# Patient Record
Sex: Male | Born: 1945 | Race: White | Hispanic: No | Marital: Married | Smoking: Never smoker
Health system: Southern US, Community
[De-identification: ages and names within clinical notes are randomized; demographics above are authoritative.]

## PROBLEM LIST (undated history)

## (undated) DIAGNOSIS — C801 Malignant (primary) neoplasm, unspecified: Secondary | ICD-10-CM

## (undated) DIAGNOSIS — N400 Enlarged prostate without lower urinary tract symptoms: Secondary | ICD-10-CM

## (undated) DIAGNOSIS — E7800 Pure hypercholesterolemia, unspecified: Secondary | ICD-10-CM

## (undated) DIAGNOSIS — Z87442 Personal history of urinary calculi: Secondary | ICD-10-CM

## (undated) DIAGNOSIS — H9319 Tinnitus, unspecified ear: Secondary | ICD-10-CM

## (undated) DIAGNOSIS — R5383 Other fatigue: Secondary | ICD-10-CM

## (undated) HISTORY — DX: Benign prostatic hyperplasia without lower urinary tract symptoms: N40.0

## (undated) HISTORY — DX: Other fatigue: R53.83

## (undated) HISTORY — DX: Personal history of urinary calculi: Z87.442

## (undated) HISTORY — DX: Tinnitus, unspecified ear: H93.19

## (undated) HISTORY — PX: CHOLECYSTECTOMY: SHX55

## (undated) HISTORY — DX: Pure hypercholesterolemia, unspecified: E78.00

---

## 2007-09-12 ENCOUNTER — Ambulatory Visit: Payer: Self-pay | Admitting: Urology

## 2007-09-12 HISTORY — PX: LITHOTRIPSY: SUR834

## 2007-11-07 ENCOUNTER — Ambulatory Visit: Payer: Self-pay | Admitting: Urology

## 2007-11-07 HISTORY — PX: LITHOTRIPSY: SUR834

## 2011-09-11 DIAGNOSIS — Z0000 Encounter for general adult medical examination without abnormal findings: Secondary | ICD-10-CM | POA: Diagnosis not present

## 2011-09-12 DIAGNOSIS — Z125 Encounter for screening for malignant neoplasm of prostate: Secondary | ICD-10-CM | POA: Diagnosis not present

## 2011-09-12 DIAGNOSIS — Z0000 Encounter for general adult medical examination without abnormal findings: Secondary | ICD-10-CM | POA: Diagnosis not present

## 2011-09-12 DIAGNOSIS — Z23 Encounter for immunization: Secondary | ICD-10-CM | POA: Diagnosis not present

## 2011-11-16 DIAGNOSIS — L821 Other seborrheic keratosis: Secondary | ICD-10-CM | POA: Diagnosis not present

## 2011-11-16 DIAGNOSIS — D239 Other benign neoplasm of skin, unspecified: Secondary | ICD-10-CM | POA: Diagnosis not present

## 2011-11-16 DIAGNOSIS — Z85828 Personal history of other malignant neoplasm of skin: Secondary | ICD-10-CM | POA: Diagnosis not present

## 2011-11-16 DIAGNOSIS — L570 Actinic keratosis: Secondary | ICD-10-CM | POA: Diagnosis not present

## 2011-11-16 DIAGNOSIS — L820 Inflamed seborrheic keratosis: Secondary | ICD-10-CM | POA: Diagnosis not present

## 2012-01-16 DIAGNOSIS — L500 Allergic urticaria: Secondary | ICD-10-CM | POA: Diagnosis not present

## 2012-03-20 DIAGNOSIS — H538 Other visual disturbances: Secondary | ICD-10-CM | POA: Diagnosis not present

## 2012-03-25 DIAGNOSIS — H538 Other visual disturbances: Secondary | ICD-10-CM | POA: Diagnosis not present

## 2012-03-25 DIAGNOSIS — M259 Joint disorder, unspecified: Secondary | ICD-10-CM | POA: Diagnosis not present

## 2012-04-02 DIAGNOSIS — S8000XA Contusion of unspecified knee, initial encounter: Secondary | ICD-10-CM | POA: Diagnosis not present

## 2012-04-02 DIAGNOSIS — S93499A Sprain of other ligament of unspecified ankle, initial encounter: Secondary | ICD-10-CM | POA: Diagnosis not present

## 2012-04-02 DIAGNOSIS — S96819A Strain of other specified muscles and tendons at ankle and foot level, unspecified foot, initial encounter: Secondary | ICD-10-CM | POA: Diagnosis not present

## 2012-11-18 DIAGNOSIS — L578 Other skin changes due to chronic exposure to nonionizing radiation: Secondary | ICD-10-CM | POA: Diagnosis not present

## 2012-11-18 DIAGNOSIS — Z85828 Personal history of other malignant neoplasm of skin: Secondary | ICD-10-CM | POA: Diagnosis not present

## 2012-11-18 DIAGNOSIS — L821 Other seborrheic keratosis: Secondary | ICD-10-CM | POA: Diagnosis not present

## 2012-11-18 DIAGNOSIS — L570 Actinic keratosis: Secondary | ICD-10-CM | POA: Diagnosis not present

## 2012-11-18 DIAGNOSIS — L820 Inflamed seborrheic keratosis: Secondary | ICD-10-CM | POA: Diagnosis not present

## 2013-05-01 DIAGNOSIS — L578 Other skin changes due to chronic exposure to nonionizing radiation: Secondary | ICD-10-CM | POA: Diagnosis not present

## 2013-05-01 DIAGNOSIS — L570 Actinic keratosis: Secondary | ICD-10-CM | POA: Diagnosis not present

## 2013-05-01 DIAGNOSIS — R209 Unspecified disturbances of skin sensation: Secondary | ICD-10-CM | POA: Diagnosis not present

## 2013-05-01 DIAGNOSIS — D485 Neoplasm of uncertain behavior of skin: Secondary | ICD-10-CM | POA: Diagnosis not present

## 2013-05-01 HISTORY — DX: Actinic keratosis: L57.0

## 2013-05-20 DIAGNOSIS — Z85828 Personal history of other malignant neoplasm of skin: Secondary | ICD-10-CM | POA: Diagnosis not present

## 2013-05-20 DIAGNOSIS — D239 Other benign neoplasm of skin, unspecified: Secondary | ICD-10-CM | POA: Diagnosis not present

## 2013-05-20 DIAGNOSIS — L578 Other skin changes due to chronic exposure to nonionizing radiation: Secondary | ICD-10-CM | POA: Diagnosis not present

## 2013-05-20 DIAGNOSIS — L820 Inflamed seborrheic keratosis: Secondary | ICD-10-CM | POA: Diagnosis not present

## 2013-05-20 DIAGNOSIS — L821 Other seborrheic keratosis: Secondary | ICD-10-CM | POA: Diagnosis not present

## 2013-05-20 DIAGNOSIS — L570 Actinic keratosis: Secondary | ICD-10-CM | POA: Diagnosis not present

## 2014-01-15 DIAGNOSIS — Z23 Encounter for immunization: Secondary | ICD-10-CM | POA: Diagnosis not present

## 2014-01-15 DIAGNOSIS — R0600 Dyspnea, unspecified: Secondary | ICD-10-CM | POA: Diagnosis not present

## 2014-01-15 DIAGNOSIS — R079 Chest pain, unspecified: Secondary | ICD-10-CM | POA: Diagnosis not present

## 2014-01-15 DIAGNOSIS — R031 Nonspecific low blood-pressure reading: Secondary | ICD-10-CM | POA: Diagnosis not present

## 2014-01-15 DIAGNOSIS — Z125 Encounter for screening for malignant neoplasm of prostate: Secondary | ICD-10-CM | POA: Diagnosis not present

## 2014-01-15 DIAGNOSIS — Z0000 Encounter for general adult medical examination without abnormal findings: Secondary | ICD-10-CM | POA: Diagnosis not present

## 2014-01-22 DIAGNOSIS — R0602 Shortness of breath: Secondary | ICD-10-CM | POA: Diagnosis not present

## 2014-01-22 DIAGNOSIS — R079 Chest pain, unspecified: Secondary | ICD-10-CM | POA: Diagnosis not present

## 2014-01-22 DIAGNOSIS — R011 Cardiac murmur, unspecified: Secondary | ICD-10-CM | POA: Diagnosis not present

## 2014-01-22 DIAGNOSIS — R05 Cough: Secondary | ICD-10-CM | POA: Diagnosis not present

## 2014-02-03 DIAGNOSIS — R0602 Shortness of breath: Secondary | ICD-10-CM | POA: Diagnosis not present

## 2014-02-03 DIAGNOSIS — R011 Cardiac murmur, unspecified: Secondary | ICD-10-CM | POA: Diagnosis not present

## 2014-02-03 DIAGNOSIS — R079 Chest pain, unspecified: Secondary | ICD-10-CM | POA: Diagnosis not present

## 2014-02-10 DIAGNOSIS — R011 Cardiac murmur, unspecified: Secondary | ICD-10-CM | POA: Diagnosis not present

## 2014-02-10 DIAGNOSIS — R05 Cough: Secondary | ICD-10-CM | POA: Diagnosis not present

## 2014-02-10 DIAGNOSIS — R072 Precordial pain: Secondary | ICD-10-CM | POA: Diagnosis not present

## 2014-02-10 DIAGNOSIS — R0602 Shortness of breath: Secondary | ICD-10-CM | POA: Diagnosis not present

## 2014-05-21 DIAGNOSIS — L570 Actinic keratosis: Secondary | ICD-10-CM | POA: Diagnosis not present

## 2014-05-21 DIAGNOSIS — Z1283 Encounter for screening for malignant neoplasm of skin: Secondary | ICD-10-CM | POA: Diagnosis not present

## 2014-05-21 DIAGNOSIS — Z85828 Personal history of other malignant neoplasm of skin: Secondary | ICD-10-CM | POA: Diagnosis not present

## 2014-05-21 DIAGNOSIS — L820 Inflamed seborrheic keratosis: Secondary | ICD-10-CM | POA: Diagnosis not present

## 2014-05-21 DIAGNOSIS — L821 Other seborrheic keratosis: Secondary | ICD-10-CM | POA: Diagnosis not present

## 2014-05-21 DIAGNOSIS — L578 Other skin changes due to chronic exposure to nonionizing radiation: Secondary | ICD-10-CM | POA: Diagnosis not present

## 2014-05-21 DIAGNOSIS — D1800 Hemangioma unspecified site: Secondary | ICD-10-CM | POA: Diagnosis not present

## 2014-07-15 DIAGNOSIS — L578 Other skin changes due to chronic exposure to nonionizing radiation: Secondary | ICD-10-CM | POA: Diagnosis not present

## 2014-07-15 DIAGNOSIS — L821 Other seborrheic keratosis: Secondary | ICD-10-CM | POA: Diagnosis not present

## 2014-07-15 DIAGNOSIS — L820 Inflamed seborrheic keratosis: Secondary | ICD-10-CM | POA: Diagnosis not present

## 2014-07-15 DIAGNOSIS — Z87442 Personal history of urinary calculi: Secondary | ICD-10-CM | POA: Insufficient documentation

## 2014-07-15 HISTORY — DX: Personal history of urinary calculi: Z87.442

## 2014-07-16 ENCOUNTER — Encounter: Payer: Self-pay | Admitting: Family Medicine

## 2014-07-16 ENCOUNTER — Ambulatory Visit (INDEPENDENT_AMBULATORY_CARE_PROVIDER_SITE_OTHER): Payer: Medicare Other | Admitting: Family Medicine

## 2014-07-16 VITALS — BP 117/71 | HR 69 | Temp 97.4°F | Ht 70.9 in | Wt 220.6 lb

## 2014-07-16 DIAGNOSIS — R5383 Other fatigue: Secondary | ICD-10-CM | POA: Diagnosis not present

## 2014-07-16 DIAGNOSIS — F5221 Male erectile disorder: Secondary | ICD-10-CM

## 2014-07-16 DIAGNOSIS — F528 Other sexual dysfunction not due to a substance or known physiological condition: Secondary | ICD-10-CM

## 2014-07-16 DIAGNOSIS — H6123 Impacted cerumen, bilateral: Secondary | ICD-10-CM

## 2014-07-16 MED ORDER — SILDENAFIL CITRATE 20 MG PO TABS: 20.0000 mg | ORAL_TABLET | Freq: Three times a day (TID) | ORAL | Status: DC

## 2014-07-16 MED ORDER — NEOMYCIN-POLYMYXIN-HC 3.5-10000-1 OT SOLN: 4.0000 [drp] | Freq: Four times a day (QID) | OTIC | Status: DC

## 2014-07-16 MED ORDER — SILDENAFIL CITRATE 100 MG PO TABS: 100.0000 mg | ORAL_TABLET | Freq: Every day | ORAL | Status: DC | PRN

## 2014-07-16 NOTE — Progress Notes (Addendum)
BP 117/71 mmHg  Pulse 69  Temp(Src) 97.4 F (36.3 C)  Ht 5' 10.9" (1.801 m)  Wt 220 lb 9.6 oz (100.064 kg)  BMI 30.85 kg/m2  SpO2 94%   Subjective:    Patient ID: Darius Williams, male    DOB: 08/22/1945, 69 y.o.   MRN: [id number]  HPI: Darius Williams is a 69 y.o. male  Chief Complaint  Patient presents with  . Follow-up    pt thinks this is a physical follow-up   Doing well but having fatague 7-8 hrs sleep at night Good nutrition Also ED concerns And ears stopped up wants wax removed  Relevant past medical, surgical, family and social history reviewed and updated as indicated. Interim medical history since our last visit reviewed. Allergies and medications reviewed and updated.  Review of Systems  Constitutional: Negative.   Respiratory: Negative.   Cardiovascular: Negative.     Per HPI unless specifically indicated above     Objective:    BP 117/71 mmHg  Pulse 69  Temp(Src) 97.4 F (36.3 C)  Ht 5' 10.9" (1.801 m)  Wt 220 lb 9.6 oz (100.064 kg)  BMI 30.85 kg/m2  SpO2 94%  Wt Readings from Last 3 Encounters:  07/16/14 220 lb 9.6 oz (100.064 kg)  01/15/14 197 lb (89.359 kg)    Physical Exam  Constitutional: He is oriented to person, place, and time. He appears well-developed and well-nourished. No distress.  HENT:  Head: Normocephalic and atraumatic.  Right Ear: Hearing normal.  Left Ear: Hearing normal.  Nose: Nose normal.  cerumen removed with difficulty in lt ear with eiather trauma or uncovered inflation. Painful Given cortisporin  After wax removed no other than inflamed  Eyes: Conjunctivae and lids are normal. Right eye exhibits no discharge. Left eye exhibits no discharge. No scleral icterus.  Cardiovascular: Normal rate, regular rhythm and normal heart sounds.   Pulmonary/Chest: Effort normal and breath sounds normal. No respiratory distress.  Musculoskeletal: Normal range of motion.  Neurological: He is alert and oriented to person, place,  and time.  Skin: Skin is intact. No rash noted.  Psychiatric: He has a normal mood and affect. His speech is normal and behavior is normal. Judgment and thought content normal. Cognition and memory are normal.    Results for orders placed or performed in visit on 10/29/23  Basic metabolic panel  Result Value Ref Range   Glucose 117 (H) 65 - 99 mg/dL   BUN 10 8 - 27 mg/dL   Creatinine, Ser 0.89 0.76 - 1.27 mg/dL   GFR calc non Af Amer 88 >59 mL/min/1.73   GFR calc Af Amer 102 >59 mL/min/1.73   BUN/Creatinine Ratio 11 10 - 22   Sodium 140 134 - 144 mmol/L   Potassium 5.0 3.5 - 5.2 mmol/L   Chloride 101 97 - 108 mmol/L   CO2 25 18 - 29 mmol/L   Calcium 9.0 8.6 - 10.2 mg/dL      Assessment & Plan:   Problem List Items Addressed This Visit    None    Visit Diagnoses    Other fatigue    -  Primary    stable    Relevant Orders    Basic metabolic panel (Completed)    ED (erectile dysfunction) of non-organic origin        discussed viagra    Cerumen debris on tympanic membrane of both ears            Follow up  Phone call Discussed with patient elevated glucose on BMP nonfasting but discussed better diet and exercise nutrition issues

## 2014-07-17 LAB — BASIC METABOLIC PANEL
BUN/Creatinine Ratio: 11 (ref 10–22)
BUN: 10 mg/dL (ref 8–27)
CO2: 25 mmol/L (ref 18–29)
Calcium: 9 mg/dL (ref 8.6–10.2)
Chloride: 101 mmol/L (ref 97–108)
Creatinine, Ser: 0.89 mg/dL (ref 0.76–1.27)
GFR calc Af Amer: 102 mL/min/{1.73_m2} (ref >59)
GFR calc non Af Amer: 88 mL/min/{1.73_m2} (ref >59)
Glucose: 117 mg/dL — ABNORMAL HIGH (ref 65–99)
Potassium: 5 mmol/L (ref 3.5–5.2)
Sodium: 140 mmol/L (ref 134–144)

## 2014-07-22 ENCOUNTER — Telehealth: Payer: Self-pay | Admitting: Family Medicine

## 2014-07-22 NOTE — Telephone Encounter (Signed)
pts wife would like to go over lab results and would like a call back at the number provided

## 2014-11-20 ENCOUNTER — Encounter: Payer: Self-pay | Admitting: Family Medicine

## 2015-01-19 ENCOUNTER — Encounter: Payer: Medicare Other | Admitting: Family Medicine

## 2015-02-04 ENCOUNTER — Encounter: Payer: Medicare Other | Admitting: Family Medicine

## 2015-02-25 DIAGNOSIS — R5383 Other fatigue: Secondary | ICD-10-CM | POA: Diagnosis not present

## 2015-02-25 DIAGNOSIS — R0789 Other chest pain: Secondary | ICD-10-CM | POA: Diagnosis not present

## 2015-02-25 DIAGNOSIS — M159 Polyosteoarthritis, unspecified: Secondary | ICD-10-CM | POA: Diagnosis not present

## 2015-02-25 DIAGNOSIS — R0602 Shortness of breath: Secondary | ICD-10-CM | POA: Diagnosis not present

## 2015-02-25 DIAGNOSIS — R05 Cough: Secondary | ICD-10-CM | POA: Diagnosis not present

## 2015-03-18 ENCOUNTER — Ambulatory Visit (INDEPENDENT_AMBULATORY_CARE_PROVIDER_SITE_OTHER): Payer: Medicare Other | Admitting: Family Medicine

## 2015-03-18 ENCOUNTER — Encounter: Payer: Self-pay | Admitting: Family Medicine

## 2015-03-18 VITALS — BP 103/63 | HR 71 | Temp 97.8°F | Ht 71.1 in | Wt 202.0 lb

## 2015-03-18 DIAGNOSIS — Z0000 Encounter for general adult medical examination without abnormal findings: Secondary | ICD-10-CM

## 2015-03-18 DIAGNOSIS — R5383 Other fatigue: Secondary | ICD-10-CM | POA: Insufficient documentation

## 2015-03-18 DIAGNOSIS — J069 Acute upper respiratory infection, unspecified: Secondary | ICD-10-CM

## 2015-03-18 DIAGNOSIS — Z113 Encounter for screening for infections with a predominantly sexual mode of transmission: Secondary | ICD-10-CM

## 2015-03-18 DIAGNOSIS — N400 Enlarged prostate without lower urinary tract symptoms: Secondary | ICD-10-CM

## 2015-03-18 DIAGNOSIS — E7800 Pure hypercholesterolemia, unspecified: Secondary | ICD-10-CM | POA: Diagnosis not present

## 2015-03-18 DIAGNOSIS — R5382 Chronic fatigue, unspecified: Secondary | ICD-10-CM | POA: Diagnosis not present

## 2015-03-18 DIAGNOSIS — Z87442 Personal history of urinary calculi: Secondary | ICD-10-CM | POA: Diagnosis not present

## 2015-03-18 HISTORY — DX: Other fatigue: R53.83

## 2015-03-18 HISTORY — DX: Benign prostatic hyperplasia without lower urinary tract symptoms: N40.0

## 2015-03-18 HISTORY — DX: Pure hypercholesterolemia, unspecified: E78.00

## 2015-03-18 LAB — URINALYSIS, ROUTINE W REFLEX MICROSCOPIC
Bilirubin, UA: NEGATIVE
Glucose, UA: NEGATIVE
Ketones, UA: NEGATIVE
Leukocytes, UA: NEGATIVE
Nitrite, UA: NEGATIVE
Protein, UA: NEGATIVE
RBC, UA: NEGATIVE
Specific Gravity, UA: 1.02 (ref 1.005–1.030)
Urobilinogen, Ur: 2 mg/dL — ABNORMAL HIGH (ref 0.2–1.0)
pH, UA: 7 (ref 5.0–7.5)

## 2015-03-18 NOTE — Progress Notes (Signed)
BP 103/63 mmHg  Pulse 71  Temp(Src) 97.8 F (36.6 C)  Ht 5' 11.1" (1.806 m)  Wt 202 lb (91.627 kg)  BMI 28.09 kg/m2  SpO2 96%   Subjective:    Patient ID: Darius Williams, male    DOB: 1945/10/14, 70 y.o.   MRN: [id number]  HPI: Darius Williams is a 70 y.o. male  Chief Complaint  Patient presents with  . Annual Exam  . URI    [phone]   patient with sinus pressure congestion feeling bad  Relevant past medical, surgical, family and social history reviewed and updated as indicated. Interim medical history since our last visit reviewed. Allergies and medications reviewed and updated.  Other than above Review of Systems  Constitutional: Negative.   HENT: Negative.   Eyes: Negative.   Respiratory: Negative.   Cardiovascular: Negative.   Gastrointestinal: Negative.   Endocrine: Negative.   Genitourinary: Negative.   Musculoskeletal: Negative.   Skin: Negative.   Allergic/Immunologic: Negative.   Neurological: Negative.   Hematological: Negative.   Psychiatric/Behavioral: Negative.     Per HPI unless specifically indicated above     Objective:    BP 103/63 mmHg  Pulse 71  Temp(Src) 97.8 F (36.6 C)  Ht 5' 11.1" (1.806 m)  Wt 202 lb (91.627 kg)  BMI 28.09 kg/m2  SpO2 96%  Wt Readings from Last 3 Encounters:  03/18/15 202 lb (91.627 kg)  07/16/14 220 lb 9.6 oz (100.064 kg)  01/15/14 197 lb (89.359 kg)    Physical Exam  Constitutional: He is oriented to person, place, and time. He appears well-developed and well-nourished.  HENT:  Head: Normocephalic and atraumatic.  Right Ear: External ear normal.  Left Ear: External ear normal.  Eyes: Conjunctivae and EOM are normal. Pupils are equal, round, and reactive to light.  Neck: Normal range of motion. Neck supple.  Cardiovascular: Normal rate, regular rhythm, normal heart sounds and intact distal pulses.   Pulmonary/Chest: Effort normal and breath sounds normal.  Abdominal: Soft. Bowel sounds are normal. There is  no splenomegaly or hepatomegaly.  Genitourinary: Rectum normal and penis normal.  Prostate enlarged  Musculoskeletal: Normal range of motion.  Neurological: He is alert and oriented to person, place, and time. He has normal reflexes.  Skin: No rash noted. No erythema.  Psychiatric: He has a normal mood and affect. His behavior is normal. Judgment and thought content normal.    Results for orders placed or performed in visit on [id number]  Basic metabolic panel  Result Value Ref Range   Glucose 117 (H) 65 - 99 mg/dL   BUN 10 8 - 27 mg/dL   Creatinine, Ser 0.89 0.76 - 1.27 mg/dL   GFR calc non Af Amer 88 >59 mL/min/1.73   GFR calc Af Amer 102 >59 mL/min/1.73   BUN/Creatinine Ratio 11 10 - 22   Sodium 140 134 - 144 mmol/L   Potassium 5.0 3.5 - 5.2 mmol/L   Chloride 101 97 - 108 mmol/L   CO2 25 18 - 29 mmol/L   Calcium 9.0 8.6 - 10.2 mg/dL      Assessment & Plan:   Problem List Items Addressed This Visit      Genitourinary   BPH (benign prostatic hyperplasia)   Relevant Orders   Comprehensive metabolic panel   Lipid panel   CBC with Differential/Platelet   TSH   PSA   Urinalysis, Routine w reflex microscopic (not at [facility])     Other   Personal history of  kidney stones   Relevant Orders   Comprehensive metabolic panel   Lipid panel   CBC with Differential/Platelet   TSH   PSA   Urinalysis, Routine w reflex microscopic (not at [facility])   Fatigue   Relevant Orders   Comprehensive metabolic panel   Lipid panel   CBC with Differential/Platelet   TSH   PSA   Urinalysis, Routine w reflex microscopic (not at [facility])   Hypercholesteremia   Relevant Orders   Comprehensive metabolic panel   Lipid panel   CBC with Differential/Platelet   TSH   PSA   Urinalysis, Routine w reflex microscopic (not at [facility])    Other Visit Diagnoses    Routine screening for STI (sexually transmitted infection)    -  Primary    Relevant Orders    Hepatitis C Antibody    Comprehensive metabolic  panel    Lipid panel    CBC with Differential/Platelet    TSH    PSA    Urinalysis, Routine w reflex microscopic (not at [facility])    Upper respiratory infection        Relevant Orders    Comprehensive metabolic panel    Lipid panel    CBC with Differential/Platelet    TSH    PSA    Urinalysis, Routine w reflex microscopic (not at [facility])    PE (physical exam), annual        Relevant Orders    Comprehensive metabolic panel    Lipid panel    CBC with Differential/Platelet    TSH    PSA    Urinalysis, Routine w reflex microscopic (not at [facility])      URI Will treat with over-the-counter medication  Follow up plan: Return in about 1 year (around 03/17/2016), or if symptoms worsen or fail to improve.

## 2015-03-19 LAB — PSA: Prostate Specific Ag, Serum: 0.8 ng/mL (ref 0.0–4.0)

## 2015-03-19 LAB — COMPREHENSIVE METABOLIC PANEL
ALT: 20 IU/L (ref 0–44)
AST: 23 IU/L (ref 0–40)
Albumin/Globulin Ratio: 1.9 (ref 1.2–2.2)
Albumin: 4.4 g/dL (ref 3.6–4.8)
Alkaline Phosphatase: 68 IU/L (ref 39–117)
BUN/Creatinine Ratio: 12 (ref 10–22)
BUN: 13 mg/dL (ref 8–27)
Bilirubin Total: 0.5 mg/dL (ref 0.0–1.2)
CO2: 26 mmol/L (ref 18–29)
Calcium: 9 mg/dL (ref 8.6–10.2)
Chloride: 98 mmol/L (ref 96–106)
Creatinine, Ser: 1.07 mg/dL (ref 0.76–1.27)
GFR calc Af Amer: 81 mL/min/{1.73_m2} (ref >59)
GFR calc non Af Amer: 70 mL/min/{1.73_m2} (ref >59)
Globulin, Total: 2.3 g/dL (ref 1.5–4.5)
Glucose: 85 mg/dL (ref 65–99)
Potassium: 4.4 mmol/L (ref 3.5–5.2)
Sodium: 138 mmol/L (ref 134–144)
Total Protein: 6.7 g/dL (ref 6.0–8.5)

## 2015-03-19 LAB — CBC WITH DIFFERENTIAL/PLATELET
Basophils Absolute: 0 10*3/uL (ref 0.0–0.2)
Basos: 1 %
EOS (ABSOLUTE): 0.2 10*3/uL (ref 0.0–0.4)
Eos: 5 %
Hematocrit: 37.7 % (ref 37.5–51.0)
Hemoglobin: 13.1 g/dL (ref 12.6–17.7)
Immature Grans (Abs): 0 10*3/uL (ref 0.0–0.1)
Immature Granulocytes: 0 %
Lymphocytes Absolute: 1.4 10*3/uL (ref 0.7–3.1)
Lymphs: 30 %
MCH: 32.3 pg (ref 26.6–33.0)
MCHC: 34.7 g/dL (ref 31.5–35.7)
MCV: 93 fL (ref 79–97)
Monocytes Absolute: 0.6 10*3/uL (ref 0.1–0.9)
Monocytes: 12 %
Neutrophils Absolute: 2.6 10*3/uL (ref 1.4–7.0)
Neutrophils: 52 %
Platelets: 151 10*3/uL (ref 150–379)
RBC: 4.05 x10E6/uL — ABNORMAL LOW (ref 4.14–5.80)
RDW: 14 % (ref 12.3–15.4)
WBC: 4.8 10*3/uL (ref 3.4–10.8)

## 2015-03-19 LAB — LIPID PANEL
Chol/HDL Ratio: 4.5 ratio units (ref 0.0–5.0)
Cholesterol, Total: 167 mg/dL (ref 100–199)
HDL: 37 mg/dL — ABNORMAL LOW (ref >39)
LDL Calculated: 85 mg/dL (ref 0–99)
Triglycerides: 223 mg/dL — ABNORMAL HIGH (ref 0–149)
VLDL Cholesterol Cal: 45 mg/dL — ABNORMAL HIGH (ref 5–40)

## 2015-03-19 LAB — HEPATITIS C ANTIBODY: Hep C Virus Ab: <0.1 s/co ratio (ref 0.0–0.9)

## 2015-03-19 LAB — TSH: TSH: 1.53 u[IU]/mL (ref 0.450–4.500)

## 2015-03-22 ENCOUNTER — Encounter: Payer: Self-pay | Admitting: Family Medicine

## 2015-04-01 ENCOUNTER — Telehealth: Payer: Self-pay | Admitting: Family Medicine

## 2015-04-01 ENCOUNTER — Other Ambulatory Visit: Payer: Self-pay | Admitting: Family Medicine

## 2015-04-01 MED ORDER — DOXYCYCLINE HYCLATE 100 MG PO TABS: 100.0000 mg | ORAL_TABLET | Freq: Two times a day (BID) | ORAL | Status: DC

## 2015-04-01 NOTE — Telephone Encounter (Signed)
Pts wife called and stated that the pt is still sick and she would like a call back with suggestions on what to take or do.

## 2015-04-01 NOTE — Progress Notes (Signed)
Phone call Patient with URI got better then now is worse has had doxycycline in the past for similar situations and did really well wants to try that again will call in prescription for doxycycline

## 2015-05-18 ENCOUNTER — Encounter: Payer: Medicare Other | Admitting: Family Medicine

## 2015-05-27 DIAGNOSIS — D225 Melanocytic nevi of trunk: Secondary | ICD-10-CM | POA: Diagnosis not present

## 2015-05-27 DIAGNOSIS — L578 Other skin changes due to chronic exposure to nonionizing radiation: Secondary | ICD-10-CM | POA: Diagnosis not present

## 2015-05-27 DIAGNOSIS — L812 Freckles: Secondary | ICD-10-CM | POA: Diagnosis not present

## 2015-05-27 DIAGNOSIS — Z1283 Encounter for screening for malignant neoplasm of skin: Secondary | ICD-10-CM | POA: Diagnosis not present

## 2015-05-27 DIAGNOSIS — Z85828 Personal history of other malignant neoplasm of skin: Secondary | ICD-10-CM | POA: Diagnosis not present

## 2015-05-27 DIAGNOSIS — D229 Melanocytic nevi, unspecified: Secondary | ICD-10-CM | POA: Diagnosis not present

## 2015-05-27 DIAGNOSIS — L570 Actinic keratosis: Secondary | ICD-10-CM | POA: Diagnosis not present

## 2015-05-27 DIAGNOSIS — L821 Other seborrheic keratosis: Secondary | ICD-10-CM | POA: Diagnosis not present

## 2015-05-27 DIAGNOSIS — D1800 Hemangioma unspecified site: Secondary | ICD-10-CM | POA: Diagnosis not present

## 2015-05-27 DIAGNOSIS — L820 Inflamed seborrheic keratosis: Secondary | ICD-10-CM | POA: Diagnosis not present

## 2016-03-21 ENCOUNTER — Encounter: Payer: Self-pay | Admitting: Family Medicine

## 2016-03-21 ENCOUNTER — Ambulatory Visit (INDEPENDENT_AMBULATORY_CARE_PROVIDER_SITE_OTHER): Payer: Medicare Other | Admitting: Family Medicine

## 2016-03-21 VITALS — BP 96/63 | HR 72 | Ht 71.85 in | Wt 205.2 lb

## 2016-03-21 DIAGNOSIS — Z125 Encounter for screening for malignant neoplasm of prostate: Secondary | ICD-10-CM

## 2016-03-21 DIAGNOSIS — Z1211 Encounter for screening for malignant neoplasm of colon: Secondary | ICD-10-CM | POA: Diagnosis not present

## 2016-03-21 DIAGNOSIS — Z1329 Encounter for screening for other suspected endocrine disorder: Secondary | ICD-10-CM

## 2016-03-21 DIAGNOSIS — Z0000 Encounter for general adult medical examination without abnormal findings: Secondary | ICD-10-CM | POA: Diagnosis not present

## 2016-03-21 DIAGNOSIS — H6123 Impacted cerumen, bilateral: Secondary | ICD-10-CM | POA: Diagnosis not present

## 2016-03-21 DIAGNOSIS — N400 Enlarged prostate without lower urinary tract symptoms: Secondary | ICD-10-CM

## 2016-03-21 DIAGNOSIS — Z13220 Encounter for screening for lipoid disorders: Secondary | ICD-10-CM | POA: Diagnosis not present

## 2016-03-21 DIAGNOSIS — E7800 Pure hypercholesterolemia, unspecified: Secondary | ICD-10-CM

## 2016-03-21 LAB — MICROSCOPIC EXAMINATION
Bacteria, UA: NONE SEEN
RBC, UA: NONE SEEN /hpf (ref 0–?)
WBC, UA: NONE SEEN /hpf (ref 0–?)

## 2016-03-21 LAB — URINALYSIS, ROUTINE W REFLEX MICROSCOPIC
Bilirubin, UA: NEGATIVE
Glucose, UA: NEGATIVE
Ketones, UA: NEGATIVE
Leukocytes, UA: NEGATIVE
Nitrite, UA: NEGATIVE
Protein, UA: NEGATIVE
RBC, UA: NEGATIVE
Specific Gravity, UA: 1.025 (ref 1.005–1.030)
Urobilinogen, Ur: 2 mg/dL — ABNORMAL HIGH (ref 0.2–1.0)
pH, UA: 5.5 (ref 5.0–7.5)

## 2016-03-21 NOTE — Assessment & Plan Note (Signed)
stable °

## 2016-03-21 NOTE — Progress Notes (Signed)
BP 96/63   Pulse 72   Ht 5' 11.85" (1.825 m)   Wt 205 lb 3.2 oz (93.1 kg)   SpO2 96%   BMI 27.95 kg/m    Subjective:    Patient ID: Darius Williams, male    DOB: [birth year], 71 y.o.   MRN: [id number]  HPI: Darius Williams is a 71 y.o. male  Chief Complaint  Patient presents with  . Annual Exam  . Tinnitus    Left mostly.   . Foot Pain    Right.   Patient with some decrease hearing some ringing in both ears nothing really bad. Also with right foot pain that was originally helped with new running shoes. Running shoes are now-year-old and having right arch support area tenderness with walking.  Relevant past medical, surgical, family and social history reviewed and updated as indicated. Interim medical history since our last visit reviewed. Allergies and medications reviewed and updated.  Review of Systems  Constitutional: Negative.   HENT: Negative.   Eyes: Negative.   Respiratory: Negative.   Cardiovascular: Negative.   Gastrointestinal: Negative.   Endocrine: Negative.   Genitourinary: Negative.   Musculoskeletal: Negative.   Skin: Negative.   Allergic/Immunologic: Negative.   Neurological: Negative.   Hematological: Negative.   Psychiatric/Behavioral: Negative.     Per HPI unless specifically indicated above     Objective:    BP 96/63   Pulse 72   Ht 5' 11.85" (1.825 m)   Wt 205 lb 3.2 oz (93.1 kg)   SpO2 96%   BMI 27.95 kg/m   Wt Readings from Last 3 Encounters:  03/21/16 205 lb 3.2 oz (93.1 kg)  03/18/15 202 lb (91.6 kg)  07/16/14 220 lb 9.6 oz (100.1 kg)    Physical Exam  Constitutional: He is oriented to person, place, and time. He appears well-developed and well-nourished.  HENT:  Head: Normocephalic and atraumatic.  Both ears blocked with cerumin  Eyes: Conjunctivae and EOM are normal. Pupils are equal, round, and reactive to light.  Neck: Normal range of motion. Neck supple.  Cardiovascular: Normal rate, regular rhythm, normal heart sounds  and intact distal pulses.   Pulmonary/Chest: Effort normal and breath sounds normal.  Abdominal: Soft. Bowel sounds are normal. There is no splenomegaly or hepatomegaly.  Genitourinary: Rectum normal, prostate normal and penis normal.  Musculoskeletal: Normal range of motion.  Neurological: He is alert and oriented to person, place, and time. He has normal reflexes.  Skin: No rash noted. No erythema.  Psychiatric: He has a normal mood and affect. His behavior is normal. Judgment and thought content normal.    Results for orders placed or performed in visit on 03/18/15  Hepatitis C Antibody  Result Value Ref Range   Hep C Virus Ab <0.1 0.0 - 0.9 s/co ratio  Comprehensive metabolic panel  Result Value Ref Range   Glucose 85 65 - 99 mg/dL   BUN 13 8 - 27 mg/dL   Creatinine, Ser 1.07 0.76 - 1.27 mg/dL   GFR calc non Af Amer 70 >59 mL/min/1.73   GFR calc Af Amer 81 >59 mL/min/1.73   BUN/Creatinine Ratio 12 10 - 22   Sodium 138 134 - 144 mmol/L   Potassium 4.4 3.5 - 5.2 mmol/L   Chloride 98 96 - 106 mmol/L   CO2 26 18 - 29 mmol/L   Calcium 9.0 8.6 - 10.2 mg/dL   Total Protein 6.7 6.0 - 8.5 g/dL   Albumin 4.4 3.6 - 4.8  g/dL   Globulin, Total 2.3 1.5 - 4.5 g/dL   Albumin/Globulin Ratio 1.9 1.2 - 2.2   Bilirubin Total 0.5 0.0 - 1.2 mg/dL   Alkaline Phosphatase 68 39 - 117 IU/L   AST 23 0 - 40 IU/L   ALT 20 0 - 44 IU/L  Lipid panel  Result Value Ref Range   Cholesterol, Total 167 100 - 199 mg/dL   Triglycerides 223 (H) 0 - 149 mg/dL   HDL 37 (L) >39 mg/dL   VLDL Cholesterol Cal 45 (H) 5 - 40 mg/dL   LDL Calculated 85 0 - 99 mg/dL   Chol/HDL Ratio 4.5 0.0 - 5.0 ratio units  CBC with Differential/Platelet  Result Value Ref Range   WBC 4.8 3.4 - 10.8 x10E3/uL   RBC 4.05 (L) 4.14 - 5.80 x10E6/uL   Hemoglobin 13.1 12.6 - 17.7 g/dL   Hematocrit 37.7 37.5 - 51.0 %   MCV 93 79 - 97 fL   MCH 32.3 26.6 - 33.0 pg   MCHC 34.7 31.5 - 35.7 g/dL   RDW 14.0 12.3 - 15.4 %   Platelets 151  150 - 379 x10E3/uL   Neutrophils 52 %   Lymphs 30 %   Monocytes 12 %   Eos 5 %   Basos 1 %   Neutrophils Absolute 2.6 1.4 - 7.0 x10E3/uL   Lymphocytes Absolute 1.4 0.7 - 3.1 x10E3/uL   Monocytes Absolute 0.6 0.1 - 0.9 x10E3/uL   EOS (ABSOLUTE) 0.2 0.0 - 0.4 x10E3/uL   Basophils Absolute 0.0 0.0 - 0.2 x10E3/uL   Immature Granulocytes 0 %   Immature Grans (Abs) 0.0 0.0 - 0.1 x10E3/uL  TSH  Result Value Ref Range   TSH 1.530 0.450 - 4.500 uIU/mL  PSA  Result Value Ref Range   Prostate Specific Ag, Serum 0.8 0.0 - 4.0 ng/mL  Urinalysis, Routine w reflex microscopic (not at [facility])  Result Value Ref Range   Specific Gravity, UA 1.020 1.005 - 1.030   pH, UA 7.0 5.0 - 7.5   Color, UA Yellow Yellow   Appearance Ur Clear Clear   Leukocytes, UA Negative Negative   Protein, UA Negative Negative/Trace   Glucose, UA Negative Negative   Ketones, UA Negative Negative   RBC, UA Negative Negative   Bilirubin, UA Negative Negative   Urobilinogen, Ur 2.0 (H) 0.2 - 1.0 mg/dL   Nitrite, UA Negative Negative      Assessment & Plan:   Problem List Items Addressed This Visit      Genitourinary   BPH (benign prostatic hyperplasia)    stable      Relevant Orders   PSA     Other   Hypercholesteremia   Relevant Orders   CBC with Differential/Platelet   Comprehensive metabolic panel   Lipid panel   Urinalysis, Routine w reflex microscopic    Other Visit Diagnoses    PE (physical exam), annual    -  Primary   Relevant Orders   CBC with Differential/Platelet   Comprehensive metabolic panel   Lipid panel   PSA   TSH   Urinalysis, Routine w reflex microscopic   Prostate cancer screening       Relevant Orders   PSA   Screening cholesterol level       Relevant Orders   Lipid panel   Thyroid disorder screen       Relevant Orders   TSH   Colon cancer screening       Relevant Orders  Ambulatory referral to Gastroenterology   Bilateral impacted cerumen       Along with tinnitus  refer to ear nose and throat to have wax removed as patient declined removal here      For the foot pain recommend new shoes about every 6 months or so. Follow up plan: Return in about 1 year (around 03/21/2017) for Physical Exam.

## 2016-03-22 ENCOUNTER — Telehealth: Payer: Self-pay | Admitting: Family Medicine

## 2016-03-22 DIAGNOSIS — R7309 Other abnormal glucose: Secondary | ICD-10-CM

## 2016-03-22 LAB — COMPREHENSIVE METABOLIC PANEL
ALT: 22 IU/L (ref 0–44)
AST: 20 IU/L (ref 0–40)
Albumin/Globulin Ratio: 1.7 (ref 1.2–2.2)
Albumin: 4.2 g/dL (ref 3.5–4.8)
Alkaline Phosphatase: 70 IU/L (ref 39–117)
BUN/Creatinine Ratio: 16 (ref 10–24)
BUN: 14 mg/dL (ref 8–27)
Bilirubin Total: 0.8 mg/dL (ref 0.0–1.2)
CO2: 24 mmol/L (ref 18–29)
Calcium: 9 mg/dL (ref 8.6–10.2)
Chloride: 100 mmol/L (ref 96–106)
Creatinine, Ser: 0.85 mg/dL (ref 0.76–1.27)
GFR calc Af Amer: 102 mL/min/{1.73_m2} (ref >59)
GFR calc non Af Amer: 88 mL/min/{1.73_m2} (ref >59)
Globulin, Total: 2.5 g/dL (ref 1.5–4.5)
Glucose: 121 mg/dL — ABNORMAL HIGH (ref 65–99)
Potassium: 4.5 mmol/L (ref 3.5–5.2)
Sodium: 140 mmol/L (ref 134–144)
Total Protein: 6.7 g/dL (ref 6.0–8.5)

## 2016-03-22 LAB — CBC WITH DIFFERENTIAL/PLATELET
Basophils Absolute: 0 10*3/uL (ref 0.0–0.2)
Basos: 1 %
EOS (ABSOLUTE): 0.2 10*3/uL (ref 0.0–0.4)
Eos: 4 %
Hematocrit: 38.9 % (ref 37.5–51.0)
Hemoglobin: 13.4 g/dL (ref 13.0–17.7)
Immature Grans (Abs): 0 10*3/uL (ref 0.0–0.1)
Immature Granulocytes: 0 %
Lymphocytes Absolute: 1.3 10*3/uL (ref 0.7–3.1)
Lymphs: 25 %
MCH: 32.4 pg (ref 26.6–33.0)
MCHC: 34.4 g/dL (ref 31.5–35.7)
MCV: 94 fL (ref 79–97)
Monocytes Absolute: 0.6 10*3/uL (ref 0.1–0.9)
Monocytes: 11 %
Neutrophils Absolute: 3.1 10*3/uL (ref 1.4–7.0)
Neutrophils: 59 %
Platelets: 139 10*3/uL — ABNORMAL LOW (ref 150–379)
RBC: 4.14 x10E6/uL (ref 4.14–5.80)
RDW: 14 % (ref 12.3–15.4)
WBC: 5.1 10*3/uL (ref 3.4–10.8)

## 2016-03-22 LAB — LIPID PANEL
Chol/HDL Ratio: 4.1 ratio units (ref 0.0–5.0)
Cholesterol, Total: 174 mg/dL (ref 100–199)
HDL: 42 mg/dL (ref >39)
LDL Calculated: 110 mg/dL — ABNORMAL HIGH (ref 0–99)
Triglycerides: 108 mg/dL (ref 0–149)
VLDL Cholesterol Cal: 22 mg/dL (ref 5–40)

## 2016-03-22 LAB — TSH: TSH: 1.9 u[IU]/mL (ref 0.450–4.500)

## 2016-03-22 LAB — PSA: Prostate Specific Ag, Serum: 1 ng/mL (ref 0.0–4.0)

## 2016-03-22 NOTE — Telephone Encounter (Signed)
Phone call Discussed with patient elevated glucose patient was fasting for test patient will come back for hemoglobin A1c and glucose.

## 2016-03-22 NOTE — Telephone Encounter (Signed)
Patients wife called regarding some things they had forgotten to discuss with Dr Jeananne Rama yesterday at the visit.   June [phone]

## 2016-03-22 NOTE — Telephone Encounter (Signed)
Routing to provider  

## 2016-05-25 ENCOUNTER — Other Ambulatory Visit: Payer: Medicare Other

## 2016-05-25 DIAGNOSIS — R7309 Other abnormal glucose: Secondary | ICD-10-CM | POA: Diagnosis not present

## 2016-05-25 LAB — BAYER DCA HB A1C WAIVED: HB A1C (BAYER DCA - WAIVED): 5.8 % (ref <7.0)

## 2016-05-25 LAB — GLUCOSE PICCOLO, WAIVED: Glucose Piccolo, Waived: 116 mg/dL (ref 73–118)

## 2016-05-31 DIAGNOSIS — J329 Chronic sinusitis, unspecified: Secondary | ICD-10-CM | POA: Diagnosis not present

## 2016-05-31 DIAGNOSIS — M174 Other bilateral secondary osteoarthritis of knee: Secondary | ICD-10-CM | POA: Diagnosis not present

## 2016-05-31 DIAGNOSIS — E669 Obesity, unspecified: Secondary | ICD-10-CM | POA: Diagnosis not present

## 2016-05-31 DIAGNOSIS — R5383 Other fatigue: Secondary | ICD-10-CM | POA: Diagnosis not present

## 2016-05-31 DIAGNOSIS — R05 Cough: Secondary | ICD-10-CM | POA: Diagnosis not present

## 2016-05-31 DIAGNOSIS — J309 Allergic rhinitis, unspecified: Secondary | ICD-10-CM | POA: Diagnosis not present

## 2016-06-07 ENCOUNTER — Other Ambulatory Visit: Payer: Self-pay | Admitting: Family Medicine

## 2016-06-08 DIAGNOSIS — L821 Other seborrheic keratosis: Secondary | ICD-10-CM | POA: Diagnosis not present

## 2016-06-08 DIAGNOSIS — Z85828 Personal history of other malignant neoplasm of skin: Secondary | ICD-10-CM | POA: Diagnosis not present

## 2016-06-08 DIAGNOSIS — L820 Inflamed seborrheic keratosis: Secondary | ICD-10-CM | POA: Diagnosis not present

## 2016-06-08 DIAGNOSIS — D1800 Hemangioma unspecified site: Secondary | ICD-10-CM | POA: Diagnosis not present

## 2016-06-08 DIAGNOSIS — L718 Other rosacea: Secondary | ICD-10-CM | POA: Diagnosis not present

## 2016-06-08 DIAGNOSIS — L812 Freckles: Secondary | ICD-10-CM | POA: Diagnosis not present

## 2016-06-08 DIAGNOSIS — L570 Actinic keratosis: Secondary | ICD-10-CM | POA: Diagnosis not present

## 2016-06-12 ENCOUNTER — Other Ambulatory Visit: Payer: Self-pay | Admitting: Family Medicine

## 2016-06-12 MED ORDER — AZITHROMYCIN 250 MG PO TABS: ORAL_TABLET | ORAL | 0 refills | Status: DC

## 2016-06-12 NOTE — Progress Notes (Signed)
Phone call Discussed patient with severe sinus bronchitis head cold getting worse after 2 weeks will call in prescription.

## 2016-07-12 DIAGNOSIS — Z1211 Encounter for screening for malignant neoplasm of colon: Secondary | ICD-10-CM | POA: Diagnosis not present

## 2016-07-12 DIAGNOSIS — J069 Acute upper respiratory infection, unspecified: Secondary | ICD-10-CM | POA: Diagnosis not present

## 2016-08-22 DIAGNOSIS — D123 Benign neoplasm of transverse colon: Secondary | ICD-10-CM | POA: Diagnosis not present

## 2016-08-22 DIAGNOSIS — K648 Other hemorrhoids: Secondary | ICD-10-CM | POA: Diagnosis not present

## 2016-08-22 DIAGNOSIS — D126 Benign neoplasm of colon, unspecified: Secondary | ICD-10-CM | POA: Diagnosis not present

## 2016-08-22 DIAGNOSIS — D129 Benign neoplasm of anus and anal canal: Secondary | ICD-10-CM | POA: Diagnosis not present

## 2016-08-22 DIAGNOSIS — Z1211 Encounter for screening for malignant neoplasm of colon: Secondary | ICD-10-CM | POA: Diagnosis not present

## 2016-08-22 DIAGNOSIS — K635 Polyp of colon: Secondary | ICD-10-CM | POA: Diagnosis not present

## 2016-08-22 DIAGNOSIS — D128 Benign neoplasm of rectum: Secondary | ICD-10-CM | POA: Diagnosis not present

## 2016-08-22 LAB — HM COLONOSCOPY

## 2016-10-26 DIAGNOSIS — L821 Other seborrheic keratosis: Secondary | ICD-10-CM | POA: Diagnosis not present

## 2016-10-26 DIAGNOSIS — L820 Inflamed seborrheic keratosis: Secondary | ICD-10-CM | POA: Diagnosis not present

## 2016-10-26 DIAGNOSIS — L812 Freckles: Secondary | ICD-10-CM | POA: Diagnosis not present

## 2016-10-26 DIAGNOSIS — L570 Actinic keratosis: Secondary | ICD-10-CM | POA: Diagnosis not present

## 2016-10-26 DIAGNOSIS — L578 Other skin changes due to chronic exposure to nonionizing radiation: Secondary | ICD-10-CM | POA: Diagnosis not present

## 2016-10-26 DIAGNOSIS — Z85828 Personal history of other malignant neoplasm of skin: Secondary | ICD-10-CM | POA: Diagnosis not present

## 2016-12-21 DIAGNOSIS — H2513 Age-related nuclear cataract, bilateral: Secondary | ICD-10-CM | POA: Diagnosis not present

## 2017-03-22 ENCOUNTER — Ambulatory Visit (INDEPENDENT_AMBULATORY_CARE_PROVIDER_SITE_OTHER): Payer: Medicare Other

## 2017-03-22 VITALS — BP 108/64 | HR 67 | Temp 98.4°F | Resp 17 | Ht 70.0 in | Wt 198.6 lb

## 2017-03-22 DIAGNOSIS — E7800 Pure hypercholesterolemia, unspecified: Secondary | ICD-10-CM | POA: Diagnosis not present

## 2017-03-22 DIAGNOSIS — Z0000 Encounter for general adult medical examination without abnormal findings: Secondary | ICD-10-CM

## 2017-03-22 DIAGNOSIS — R5383 Other fatigue: Secondary | ICD-10-CM

## 2017-03-22 DIAGNOSIS — N400 Enlarged prostate without lower urinary tract symptoms: Secondary | ICD-10-CM

## 2017-03-22 DIAGNOSIS — Z87442 Personal history of urinary calculi: Secondary | ICD-10-CM | POA: Diagnosis not present

## 2017-03-22 NOTE — Progress Notes (Signed)
Subjective:   Darius Williams is a 72 y.o. male who presents for Medicare Annual/Subsequent preventive examination.  Review of Systems:       Objective:    Vitals: BP 108/64 (BP Location: Left Arm, Patient Position: Sitting)   Pulse 67   Temp 98.4 F (36.9 C) (Temporal)   Resp 17   Ht 5\' 10"  (1.778 m)   Wt 198 lb 9.6 oz (90.1 kg)   BMI 28.50 kg/m   Body mass index is 28.5 kg/m.  Advanced Directives 03/22/2017  Does Patient Have a Medical Advance Directive? No  Would patient like information on creating a medical advance directive? No - Patient declined    Tobacco Social History   Tobacco Use  Smoking Status Never Smoker  Smokeless Tobacco Never Used     Counseling given: Not Answered   Clinical Intake:  Pre-visit preparation completed: Yes  Pain : No/denies pain     Nutritional Status: BMI 25 -29 Overweight Nutritional Risks: None Diabetes: No  How often do you need to have someone help you when you read instructions, pamphlets, or other written materials from your doctor or pharmacy?: 1 - Never What is the last grade level you completed in school?: PHD  Interpreter Needed?: No  Information entered by :: Tiffany Hill,LPN   Past Medical History:  Diagnosis Date  . Kidney stones    Past Surgical History:  Procedure Laterality Date  . CHOLECYSTECTOMY     Family History  Problem Relation Age of Onset  . Cancer Mother        lung  . Cancer Sister        breast  . Cancer Father        prostate and skin  . Varicose Veins Son   . Cancer Paternal Grandmother        brain   Social History   Socioeconomic History  . Marital status: Married    Spouse name: Not on file  . Number of children: Not on file  . Years of education: Not on file  . Highest education level: Not on file  Occupational History  . Not on file  Social Needs  . Financial resource strain: Not hard at all  . Food insecurity:    Worry: Never true    Inability: Never true  .  Transportation needs:    Medical: No    Non-medical: No  Tobacco Use  . Smoking status: Never Smoker  . Smokeless tobacco: Never Used  Substance and Sexual Activity  . Alcohol use: No    Alcohol/week: 0.0 oz  . Drug use: No  . Sexual activity: Never  Lifestyle  . Physical activity:    Days per week: 0 days    Minutes per session: 0 min  . Stress: Not at all  Relationships  . Social connections:    Talks on phone: More than three times a week    Gets together: More than three times a week    Attends religious service: More than 4 times per year    Active member of club or organization: Yes    Attends meetings of clubs or organizations: Not on file    Relationship status: Married  Other Topics Concern  . Not on file  Social History Narrative  . Not on file    Outpatient Encounter Medications as of 03/22/2017  Medication Sig  . [DISCONTINUED] azithromycin (ZITHROMAX) 250 MG tablet 2 now then 1 a day (Patient not taking: Reported on  03/22/2017)   No facility-administered encounter medications on file as of 03/22/2017.     Activities of Daily Living In your present state of health, do you have any difficulty performing the following activities: 03/22/2017  Hearing? Y  Vision? N  Difficulty concentrating or making decisions? Y  Walking or climbing stairs? N  Dressing or bathing? N  Doing errands, shopping? N  Preparing Food and eating ? N  Using the Toilet? N  In the past six months, have you accidently leaked urine? N  Do you have problems with loss of bowel control? N  Managing your Medications? N  Managing your Finances? N  Housekeeping or managing your Housekeeping? N  Some recent data might be hidden    Patient Care Team: Guadalupe Maple, MD as PCP - General (Family Medicine) Yolonda Kida, MD as Consulting Physician (Cardiology) Ralene Bathe, MD (Dermatology)   Assessment:   This is a routine wellness examination for Darius Williams.  Exercise Activities  and Dietary recommendations Current Exercise Habits: The patient does not participate in regular exercise at present, Exercise limited by: None identified  Goals    . DIET - INCREASE WATER INTAKE     recommend drinking at least 6-8 glasses of water a day        Fall Risk Fall Risk  03/22/2017 03/21/2016 03/18/2015  Falls in the past year? Yes No No  Number falls in past yr: 1 - -  Injury with Fall? No - -  Follow up Falls prevention discussed - -   Is the patient's home free of loose throw rugs in walkways, pet beds, electrical cords, etc?   no      Grab bars in the bathroom? no      Handrails on the stairs?   yes      Adequate lighting?   yes  Timed Get Up and Go Performed: Completed in 8 seconds with no use of assistive devices, steady gait. No intervention needed at this time.   Depression Screen PHQ 2/9 Scores 03/22/2017 03/21/2016 03/18/2015  PHQ - 2 Score 0 0 1    Cognitive Function     6CIT Screen 03/22/2017  What Year? 0 points  What month? 0 points  What time? 0 points  Count back from 20 0 points  Months in reverse 0 points  Repeat phrase 0 points  Total Score 0    Immunization History  Administered Date(s) Administered  . Pneumococcal Conjugate-13 01/15/2014  . Pneumococcal Polysaccharide-23 09/12/2011  . Td 11/11/2004  . Zoster 09/12/2011    Qualifies for Shingles Vaccine? Yes, discussed shingrix vaccine   Screening Tests Health Maintenance  Topic Date Due  . TETANUS/TDAP  11/12/2014  . INFLUENZA VACCINE  11/22/2017 (Originally 08/02/2016)  . COLONOSCOPY  08/22/2021  . Hepatitis C Screening  Completed  . PNA vac Low Risk Adult  Completed   Cancer Screenings: Lung: Low Dose CT Chest recommended if Age 56-80 years, 30 pack-year currently smoking OR have quit w/in 15years. Patient does not qualify. Colorectal: completed 08/22/2016  Additional Screenings:  Hepatitis B/HIV/Syphillis:not indicated Hepatitis C Screening: completed 03/18/2015    Plan:     I have personally reviewed and addressed the Medicare Annual Wellness questionnaire and have noted the following in the patient's chart:  A. Medical and social history B. Use of alcohol, tobacco or illicit drugs  C. Current medications and supplements D. Functional ability and status E.  Nutritional status F.  Physical activity G. Advance directives H. List  of other physicians I.  Hospitalizations, surgeries, and ER visits in previous 12 months J.  [place] such as hearing and vision if needed, cognitive and depression L. Referrals and appointments   In addition, I have reviewed and discussed with patient certain preventive protocols, quality metrics, and best practice recommendations. A written personalized care plan for preventive services as well as general preventive health recommendations were provided to patient.   Signed,  Tyler Aas, LPN Nurse Health Advisor   Nurse Notes:none

## 2017-03-22 NOTE — Patient Instructions (Signed)
[name] , Thank you for taking time to come for your Medicare Wellness Visit. I appreciate your ongoing commitment to your health goals. Please review the following plan we discussed and let me know if I can assist you in the future.   Screening recommendations/referrals: Colonoscopy: completed 08/22/2016 Recommended yearly ophthalmology/optometry visit for glaucoma screening and checkup Recommended yearly dental visit for hygiene and checkup  Vaccinations: Influenza vaccine: declined Pneumococcal vaccine: up to date  Tdap vaccine: due, check with your insurance company for coverage  Shingles vaccine: up to date   Advanced directives: Advance directive discussed with you today. Even though you declined this today please call our office should you change your mind and we can give you the proper paperwork for you to fill out.  Conditions/risks identified: recommend drinking at least 6-8 glasses of water a day   Next appointment: Follow up on 06/22/2017 at 3:00pm with Dr.Crissman. Follow up in one year for your annual wellness exam.   Preventive Care 65 Years and Older, Male Preventive care refers to lifestyle choices and visits with your health care provider that can promote health and wellness. What does preventive care include?  A yearly physical exam. This is also called an annual well check.  Dental exams once or twice a year.  Routine eye exams. Ask your health care provider how often you should have your eyes checked.  Personal lifestyle choices, including:  Daily care of your teeth and gums.  Regular physical activity.  Eating a healthy diet.  Avoiding tobacco and drug use.  Limiting alcohol use.  Practicing safe sex.  Taking low doses of aspirin every day.  Taking vitamin and mineral supplements as recommended by your health care provider. What happens during an annual well check? The services and screenings done by your health care provider during your annual  well check will depend on your age, overall health, lifestyle risk factors, and family history of disease. Counseling  Your health care provider may ask you questions about your:  Alcohol use.  Tobacco use.  Drug use.  Emotional well-being.  Home and relationship well-being.  Sexual activity.  Eating habits.  History of falls.  Memory and ability to understand (cognition).  Work and work Statistician. Screening  You may have the following tests or measurements:  Height, weight, and BMI.  Blood pressure.  Lipid and cholesterol levels. These may be checked every 5 years, or more frequently if you are over 48 years old.  Skin check.  Lung cancer screening. You may have this screening every year starting at age 42 if you have a 30-pack-year history of smoking and currently smoke or have quit within the past 15 years.  Fecal occult blood test (FOBT) of the stool. You may have this test every year starting at age 109.  Flexible sigmoidoscopy or colonoscopy. You may have a sigmoidoscopy every 5 years or a colonoscopy every 10 years starting at age 5.  Prostate cancer screening. Recommendations will vary depending on your family history and other risks.  Hepatitis C blood test.  Hepatitis B blood test.  Sexually transmitted disease (STD) testing.  Diabetes screening. This is done by checking your blood sugar (glucose) after you have not eaten for a while (fasting). You may have this done every 1-3 years.  Abdominal aortic aneurysm (AAA) screening. You may need this if you are a current or former smoker.  Osteoporosis. You may be screened starting at age 83 if you are at high risk. Talk with  your health care provider about your test results, treatment options, and if necessary, the need for more tests. Vaccines  Your health care provider may recommend certain vaccines, such as:  Influenza vaccine. This is recommended every year.  Tetanus, diphtheria, and acellular  pertussis (Tdap, Td) vaccine. You may need a Td booster every 10 years.  Zoster vaccine. You may need this after age 54.  Pneumococcal 13-valent conjugate (PCV13) vaccine. One dose is recommended after age 7.  Pneumococcal polysaccharide (PPSV23) vaccine. One dose is recommended after age 34. Talk to your health care provider about which screenings and vaccines you need and how often you need them. This information is not intended to replace advice given to you by your health care provider. Make sure you discuss any questions you have with your health care provider. Document Released: 01/15/2015 Document Revised: 09/08/2015 Document Reviewed: 10/20/2014 Elsevier Interactive Patient Education  2017 [employer] Prevention in the Home Falls can cause injuries. They can happen to people of all ages. There are many things you can do to make your home safe and to help prevent falls. What can I do on the outside of my home?  Regularly fix the edges of walkways and driveways and fix any cracks.  Remove anything that might make you trip as you walk through a door, such as a raised step or threshold.  Trim any bushes or trees on the path to your home.  Use bright outdoor lighting.  Clear any walking paths of anything that might make someone trip, such as rocks or tools.  Regularly check to see if handrails are loose or broken. Make sure that both sides of any steps have handrails.  Any raised decks and porches should have guardrails on the edges.  Have any leaves, snow, or ice cleared regularly.  Use sand or salt on walking paths during winter.  Clean up any spills in your garage right away. This includes oil or grease spills. What can I do in the bathroom?  Use night lights.  Install grab bars by the toilet and in the tub and shower. Do not use towel bars as grab bars.  Use non-skid mats or decals in the tub or shower.  If you need to sit down in the shower, use a plastic,  non-slip stool.  Keep the floor dry. Clean up any water that spills on the floor as soon as it happens.  Remove soap buildup in the tub or shower regularly.  Attach bath mats securely with double-sided non-slip rug tape.  Do not have throw rugs and other things on the floor that can make you trip. What can I do in the bedroom?  Use night lights.  Make sure that you have a light by your bed that is easy to reach.  Do not use any sheets or blankets that are too big for your bed. They should not hang down onto the floor.  Have a firm chair that has side arms. You can use this for support while you get dressed.  Do not have throw rugs and other things on the floor that can make you trip. What can I do in the kitchen?  Clean up any spills right away.  Avoid walking on wet floors.  Keep items that you use a lot in easy-to-reach places.  If you need to reach something above you, use a strong step stool that has a grab bar.  Keep electrical cords out of the way.  Do not  use floor polish or wax that makes floors slippery. If you must use wax, use non-skid floor wax.  Do not have throw rugs and other things on the floor that can make you trip. What can I do with my stairs?  Do not leave any items on the stairs.  Make sure that there are handrails on both sides of the stairs and use them. Fix handrails that are broken or loose. Make sure that handrails are as long as the stairways.  Check any carpeting to make sure that it is firmly attached to the stairs. Fix any carpet that is loose or worn.  Avoid having throw rugs at the top or bottom of the stairs. If you do have throw rugs, attach them to the floor with carpet tape.  Make sure that you have a light switch at the top of the stairs and the bottom of the stairs. If you do not have them, ask someone to add them for you. What else can I do to help prevent falls?  Wear shoes that:  Do not have high heels.  Have rubber  bottoms.  Are comfortable and fit you well.  Are closed at the toe. Do not wear sandals.  If you use a stepladder:  Make sure that it is fully opened. Do not climb a closed stepladder.  Make sure that both sides of the stepladder are locked into place.  Ask someone to hold it for you, if possible.  Clearly mark and make sure that you can see:  Any grab bars or handrails.  First and last steps.  Where the edge of each step is.  Use tools that help you move around (mobility aids) if they are needed. These include:  Canes.  Walkers.  Scooters.  Crutches.  Turn on the lights when you go into a dark area. Replace any light bulbs as soon as they burn out.  Set up your furniture so you have a clear path. Avoid moving your furniture around.  If any of your floors are uneven, fix them.  If there are any pets around you, be aware of where they are.  Review your medicines with your doctor. Some medicines can make you feel dizzy. This can increase your chance of falling. Ask your doctor what other things that you can do to help prevent falls. This information is not intended to replace advice given to you by your health care provider. Make sure you discuss any questions you have with your health care provider. Document Released: 10/15/2008 Document Revised: 05/27/2015 Document Reviewed: 01/23/2014 Elsevier Interactive Patient Education  2017 [employer].

## 2017-03-27 ENCOUNTER — Encounter: Payer: Medicare Other | Admitting: Family Medicine

## 2017-06-04 DIAGNOSIS — R0602 Shortness of breath: Secondary | ICD-10-CM | POA: Diagnosis not present

## 2017-06-04 DIAGNOSIS — I208 Other forms of angina pectoris: Secondary | ICD-10-CM | POA: Diagnosis not present

## 2017-06-04 DIAGNOSIS — J309 Allergic rhinitis, unspecified: Secondary | ICD-10-CM | POA: Diagnosis not present

## 2017-06-04 DIAGNOSIS — M174 Other bilateral secondary osteoarthritis of knee: Secondary | ICD-10-CM | POA: Diagnosis not present

## 2017-06-04 DIAGNOSIS — E669 Obesity, unspecified: Secondary | ICD-10-CM | POA: Diagnosis not present

## 2017-06-04 DIAGNOSIS — R05 Cough: Secondary | ICD-10-CM | POA: Diagnosis not present

## 2017-06-04 DIAGNOSIS — J329 Chronic sinusitis, unspecified: Secondary | ICD-10-CM | POA: Diagnosis not present

## 2017-06-11 DIAGNOSIS — I208 Other forms of angina pectoris: Secondary | ICD-10-CM | POA: Diagnosis not present

## 2017-06-11 DIAGNOSIS — R0602 Shortness of breath: Secondary | ICD-10-CM | POA: Diagnosis not present

## 2017-06-13 ENCOUNTER — Encounter: Payer: Self-pay | Admitting: Family Medicine

## 2017-06-13 ENCOUNTER — Ambulatory Visit (INDEPENDENT_AMBULATORY_CARE_PROVIDER_SITE_OTHER): Payer: Medicare Other | Admitting: Family Medicine

## 2017-06-13 DIAGNOSIS — N400 Enlarged prostate without lower urinary tract symptoms: Secondary | ICD-10-CM | POA: Diagnosis not present

## 2017-06-13 DIAGNOSIS — D225 Melanocytic nevi of trunk: Secondary | ICD-10-CM | POA: Diagnosis not present

## 2017-06-13 DIAGNOSIS — H9319 Tinnitus, unspecified ear: Secondary | ICD-10-CM

## 2017-06-13 DIAGNOSIS — Z0000 Encounter for general adult medical examination without abnormal findings: Secondary | ICD-10-CM

## 2017-06-13 DIAGNOSIS — L570 Actinic keratosis: Secondary | ICD-10-CM | POA: Diagnosis not present

## 2017-06-13 DIAGNOSIS — D1800 Hemangioma unspecified site: Secondary | ICD-10-CM | POA: Diagnosis not present

## 2017-06-13 DIAGNOSIS — R079 Chest pain, unspecified: Secondary | ICD-10-CM | POA: Diagnosis not present

## 2017-06-13 DIAGNOSIS — L821 Other seborrheic keratosis: Secondary | ICD-10-CM | POA: Diagnosis not present

## 2017-06-13 DIAGNOSIS — L578 Other skin changes due to chronic exposure to nonionizing radiation: Secondary | ICD-10-CM | POA: Diagnosis not present

## 2017-06-13 DIAGNOSIS — D229 Melanocytic nevi, unspecified: Secondary | ICD-10-CM | POA: Diagnosis not present

## 2017-06-13 DIAGNOSIS — H9313 Tinnitus, bilateral: Secondary | ICD-10-CM

## 2017-06-13 DIAGNOSIS — R5382 Chronic fatigue, unspecified: Secondary | ICD-10-CM | POA: Diagnosis not present

## 2017-06-13 DIAGNOSIS — L812 Freckles: Secondary | ICD-10-CM | POA: Diagnosis not present

## 2017-06-13 DIAGNOSIS — Z87442 Personal history of urinary calculi: Secondary | ICD-10-CM | POA: Diagnosis not present

## 2017-06-13 DIAGNOSIS — Z1283 Encounter for screening for malignant neoplasm of skin: Secondary | ICD-10-CM | POA: Diagnosis not present

## 2017-06-13 DIAGNOSIS — L820 Inflamed seborrheic keratosis: Secondary | ICD-10-CM | POA: Diagnosis not present

## 2017-06-13 DIAGNOSIS — Z85828 Personal history of other malignant neoplasm of skin: Secondary | ICD-10-CM | POA: Diagnosis not present

## 2017-06-13 HISTORY — DX: Tinnitus, unspecified ear: H93.19

## 2017-06-13 NOTE — Assessment & Plan Note (Signed)
Discussed because of recurrent nature of kidney stones and patient has not been to urology will refer to urology to further evaluate.

## 2017-06-13 NOTE — Progress Notes (Signed)
BP 126/70   Pulse 65   Temp 97.8 F (36.6 C) (Oral)   Ht 5\' 11"  (1.803 m)   Wt 197 lb 6.4 oz (89.5 kg)   SpO2 96%   BMI 27.53 kg/m    Subjective:    Patient ID: Darius Williams, male    DOB: May 28, 1945, 72 y.o.   MRN: [id number]  HPI: Darius Williams is a 72 y.o. male  Chief Complaint  Patient presents with  . Annual Exam    pt had wellness exam 03/22/17   Patient here accompanied by wife who assists with history. Patient had stress test which was reviewed preliminary results are normal but terminated early due to fatigue and looks like from my reading that all was normal.  Other than deconditioning. Patient also has been having kidney stones again has not been to urology for some time is been drinking a lot of water trying to flush his kidneys clear. Patient also with tinnitus left greater than right.  Is on the time and is pretty loud. Relevant past medical, surgical, family and social history reviewed and updated as indicated. Interim medical history since our last visit reviewed. Allergies and medications reviewed and updated.  Review of Systems  Constitutional: Negative.   HENT: Negative.   Eyes: Negative.   Respiratory: Negative.   Cardiovascular: Negative.   Gastrointestinal: Negative.   Endocrine: Negative.   Genitourinary: Negative.   Musculoskeletal: Negative.   Skin: Negative.   Allergic/Immunologic: Negative.   Neurological: Negative.   Hematological: Negative.   Psychiatric/Behavioral: Negative.     Per HPI unless specifically indicated above     Objective:    BP 126/70   Pulse 65   Temp 97.8 F (36.6 C) (Oral)   Ht 5\' 11"  (1.803 m)   Wt 197 lb 6.4 oz (89.5 kg)   SpO2 96%   BMI 27.53 kg/m   Wt Readings from Last 3 Encounters:  06/13/17 197 lb 6.4 oz (89.5 kg)  03/22/17 198 lb 9.6 oz (90.1 kg)  03/21/16 205 lb 3.2 oz (93.1 kg)    Physical Exam  Constitutional: He is oriented to person, place, and time. He appears well-developed and  well-nourished.  HENT:  Head: Normocephalic and atraumatic.  Right Ear: External ear normal.  Left Ear: External ear normal.  Cerumen impaction and tinnitus will refer to ENT to further evaluate  Eyes: Pupils are equal, round, and reactive to light. Conjunctivae and EOM are normal.  Neck: Normal range of motion. Neck supple.  Cardiovascular: Normal rate, regular rhythm, normal heart sounds and intact distal pulses.  Pulmonary/Chest: Effort normal and breath sounds normal.  Abdominal: Soft. Bowel sounds are normal. There is no splenomegaly or hepatomegaly.  Genitourinary: Rectum normal and penis normal.  Genitourinary Comments: BPH changes  Musculoskeletal: Normal range of motion.  Neurological: He is alert and oriented to person, place, and time. He has normal reflexes.  Skin: No rash noted. No erythema.  Psychiatric: He has a normal mood and affect. His behavior is normal. Judgment and thought content normal.    Results for orders placed or performed in visit on 09/06/16  HM COLONOSCOPY  Result Value Ref Range   HM Colonoscopy See Report (in chart) See Report (in chart), Patient Reported      Assessment & Plan:   Problem List Items Addressed This Visit      Genitourinary   BPH (benign prostatic hyperplasia)    stable      Relevant Orders  TSH   PSA   Urinalysis, Routine w reflex microscopic     Other   Personal history of kidney stones    Discussed because of recurrent nature of kidney stones and patient has not been to urology will refer to urology to further evaluate.      Relevant Orders   Ambulatory referral to Urology   Comprehensive metabolic panel   Lipid panel   CBC with Differential/Platelet   TSH   Urinalysis, Routine w reflex microscopic   Fatigue    Some fatigue primarily from deconditioning but will check thyroid      Relevant Orders   TSH   Chest pain    Chest pain preliminary review of cardiology testing looks normal except for deconditioning  patient has not heard from cardiology      Relevant Orders   Comprehensive metabolic panel   Lipid panel   CBC with Differential/Platelet   TSH   Urinalysis, Routine w reflex microscopic   Tinnitus   Relevant Orders   Ambulatory referral to ENT       Follow up plan: Return in about 1 year (around 06/14/2018).

## 2017-06-13 NOTE — Assessment & Plan Note (Signed)
stable °

## 2017-06-13 NOTE — Assessment & Plan Note (Signed)
Chest pain preliminary review of cardiology testing looks normal except for deconditioning patient has not heard from cardiology

## 2017-06-13 NOTE — Assessment & Plan Note (Signed)
Some fatigue primarily from deconditioning but will check thyroid

## 2017-06-14 ENCOUNTER — Encounter: Payer: Self-pay | Admitting: Family Medicine

## 2017-06-14 DIAGNOSIS — I208 Other forms of angina pectoris: Secondary | ICD-10-CM | POA: Diagnosis not present

## 2017-06-14 DIAGNOSIS — J329 Chronic sinusitis, unspecified: Secondary | ICD-10-CM | POA: Diagnosis not present

## 2017-06-14 DIAGNOSIS — R05 Cough: Secondary | ICD-10-CM | POA: Diagnosis not present

## 2017-06-14 DIAGNOSIS — R5383 Other fatigue: Secondary | ICD-10-CM | POA: Diagnosis not present

## 2017-06-14 DIAGNOSIS — M174 Other bilateral secondary osteoarthritis of knee: Secondary | ICD-10-CM | POA: Diagnosis not present

## 2017-06-14 DIAGNOSIS — R0602 Shortness of breath: Secondary | ICD-10-CM | POA: Diagnosis not present

## 2017-06-14 DIAGNOSIS — E669 Obesity, unspecified: Secondary | ICD-10-CM | POA: Diagnosis not present

## 2017-06-14 DIAGNOSIS — J309 Allergic rhinitis, unspecified: Secondary | ICD-10-CM | POA: Diagnosis not present

## 2017-06-14 LAB — LIPID PANEL
Chol/HDL Ratio: 4.4 ratio (ref 0.0–5.0)
Cholesterol, Total: 173 mg/dL (ref 100–199)
HDL: 39 mg/dL — ABNORMAL LOW (ref >39)
LDL Calculated: 74 mg/dL (ref 0–99)
Triglycerides: 299 mg/dL — ABNORMAL HIGH (ref 0–149)
VLDL Cholesterol Cal: 60 mg/dL — ABNORMAL HIGH (ref 5–40)

## 2017-06-14 LAB — URINALYSIS, ROUTINE W REFLEX MICROSCOPIC
Bilirubin, UA: NEGATIVE
Glucose, UA: NEGATIVE
Leukocytes, UA: NEGATIVE
Nitrite, UA: NEGATIVE
Protein, UA: NEGATIVE
Specific Gravity, UA: 1.025 (ref 1.005–1.030)
Urobilinogen, Ur: 0.2 mg/dL (ref 0.2–1.0)
pH, UA: 5 (ref 5.0–7.5)

## 2017-06-14 LAB — CBC WITH DIFFERENTIAL/PLATELET
Basophils Absolute: 0 10*3/uL (ref 0.0–0.2)
Basos: 0 %
EOS (ABSOLUTE): 0.1 10*3/uL (ref 0.0–0.4)
Eos: 3 %
Hematocrit: 40 % (ref 37.5–51.0)
Hemoglobin: 13.5 g/dL (ref 13.0–17.7)
Immature Grans (Abs): 0 10*3/uL (ref 0.0–0.1)
Immature Granulocytes: 0 %
Lymphocytes Absolute: 1.1 10*3/uL (ref 0.7–3.1)
Lymphs: 23 %
MCH: 32.3 pg (ref 26.6–33.0)
MCHC: 33.8 g/dL (ref 31.5–35.7)
MCV: 96 fL (ref 79–97)
Monocytes Absolute: 0.6 10*3/uL (ref 0.1–0.9)
Monocytes: 11 %
Neutrophils Absolute: 3.2 10*3/uL (ref 1.4–7.0)
Neutrophils: 63 %
Platelets: 134 10*3/uL — ABNORMAL LOW (ref 150–450)
RBC: 4.18 x10E6/uL (ref 4.14–5.80)
RDW: 14.2 % (ref 12.3–15.4)
WBC: 5 10*3/uL (ref 3.4–10.8)

## 2017-06-14 LAB — COMPREHENSIVE METABOLIC PANEL
ALT: 23 IU/L (ref 0–44)
AST: 22 IU/L (ref 0–40)
Albumin/Globulin Ratio: 1.8 (ref 1.2–2.2)
Albumin: 4.5 g/dL (ref 3.5–4.8)
Alkaline Phosphatase: 78 IU/L (ref 39–117)
BUN/Creatinine Ratio: 17 (ref 10–24)
BUN: 16 mg/dL (ref 8–27)
Bilirubin Total: 0.6 mg/dL (ref 0.0–1.2)
CO2: 24 mmol/L (ref 20–29)
Calcium: 9.4 mg/dL (ref 8.6–10.2)
Chloride: 102 mmol/L (ref 96–106)
Creatinine, Ser: 0.92 mg/dL (ref 0.76–1.27)
GFR calc Af Amer: 96 mL/min/{1.73_m2} (ref >59)
GFR calc non Af Amer: 83 mL/min/{1.73_m2} (ref >59)
Globulin, Total: 2.5 g/dL (ref 1.5–4.5)
Glucose: 93 mg/dL (ref 65–99)
Potassium: 4.6 mmol/L (ref 3.5–5.2)
Sodium: 142 mmol/L (ref 134–144)
Total Protein: 7 g/dL (ref 6.0–8.5)

## 2017-06-14 LAB — MICROSCOPIC EXAMINATION: Bacteria, UA: NONE SEEN

## 2017-06-14 LAB — PSA: Prostate Specific Ag, Serum: 1.2 ng/mL (ref 0.0–4.0)

## 2017-06-14 LAB — TSH: TSH: 1.84 u[IU]/mL (ref 0.450–4.500)

## 2017-07-12 ENCOUNTER — Telehealth: Payer: Self-pay | Admitting: Family Medicine

## 2017-07-12 NOTE — Telephone Encounter (Signed)
Copied from [place] [id number]. Topic: Quick Communication - See Telephone Encounter >> Jul 12, 2017 11:29 AM Mylinda Latina, NT wrote: CRM for notification. See Telephone encounter for: 07/12/17. Patient wife called and states the patient is inquiring about lab results. CB# [phone]

## 2017-07-12 NOTE — Telephone Encounter (Signed)
Call june

## 2017-07-12 NOTE — Telephone Encounter (Signed)
Patient was transferred to provider for telephone conversation.   

## 2017-07-18 DIAGNOSIS — H9312 Tinnitus, left ear: Secondary | ICD-10-CM | POA: Diagnosis not present

## 2017-07-18 DIAGNOSIS — H6123 Impacted cerumen, bilateral: Secondary | ICD-10-CM | POA: Diagnosis not present

## 2017-07-30 ENCOUNTER — Telehealth: Payer: Self-pay | Admitting: Urology

## 2017-07-30 NOTE — Telephone Encounter (Signed)
Per [facility], there are no old records to send for this patient.

## 2017-07-31 ENCOUNTER — Ambulatory Visit
Admission: RE | Admit: 2017-07-31 | Discharge: 2017-07-31 | Disposition: A | Discharge status: Home / Self Care | Payer: Medicare Other | Source: Ambulatory Visit | Attending: Urology | Admitting: Urology

## 2017-07-31 ENCOUNTER — Telehealth: Payer: Self-pay | Admitting: Urology

## 2017-07-31 ENCOUNTER — Ambulatory Visit (INDEPENDENT_AMBULATORY_CARE_PROVIDER_SITE_OTHER): Payer: Medicare Other | Admitting: Urology

## 2017-07-31 ENCOUNTER — Encounter: Payer: Self-pay | Admitting: Urology

## 2017-07-31 VITALS — BP 106/66 | HR 69 | Ht 71.0 in | Wt 196.0 lb

## 2017-07-31 DIAGNOSIS — M160 Bilateral primary osteoarthritis of hip: Secondary | ICD-10-CM | POA: Insufficient documentation

## 2017-07-31 DIAGNOSIS — R3129 Other microscopic hematuria: Secondary | ICD-10-CM

## 2017-07-31 DIAGNOSIS — Z87442 Personal history of urinary calculi: Secondary | ICD-10-CM

## 2017-07-31 DIAGNOSIS — N200 Calculus of kidney: Secondary | ICD-10-CM | POA: Diagnosis not present

## 2017-07-31 DIAGNOSIS — I878 Other specified disorders of veins: Secondary | ICD-10-CM | POA: Insufficient documentation

## 2017-07-31 DIAGNOSIS — R5382 Chronic fatigue, unspecified: Secondary | ICD-10-CM

## 2017-07-31 LAB — MICROSCOPIC EXAMINATION
Bacteria, UA: NONE SEEN
Epithelial Cells (non renal): NONE SEEN /hpf (ref 0–10)
RBC, UA: >30 /hpf — ABNORMAL HIGH (ref 0–2)
WBC, UA: NONE SEEN /hpf (ref 0–5)

## 2017-07-31 LAB — URINALYSIS, COMPLETE
Bilirubin, UA: NEGATIVE
Glucose, UA: NEGATIVE
Ketones, UA: NEGATIVE
Leukocytes, UA: NEGATIVE
Nitrite, UA: NEGATIVE
Specific Gravity, UA: 1.025 (ref 1.005–1.030)
Urobilinogen, Ur: 1 mg/dL (ref 0.2–1.0)
pH, UA: 5.5 (ref 5.0–7.5)

## 2017-07-31 NOTE — Telephone Encounter (Signed)
-----   Message from Hollice Espy, MD sent at 07/31/2017 12:56 PM EDT ----- Regarding: hematuria This patient left before I saw his urine which has blood in it.  I have ordered a CT scan and would like him to come back in 4 weeks for cystoscopy and follow-up CT scan.  I called him and left a message and am awaiting return phone call.  Can go ahead and schedule his CT as well as his appropriate follow-up?  Hollice Espy, MD

## 2017-07-31 NOTE — Progress Notes (Signed)
07/31/2017 12:55 PM   Darius Williams Darius Williams 21, 1947 [id number]  Referring provider: Guadalupe Maple, MD [address] [place], [place] [address]  Chief Complaint  Patient presents with  . Nephrolithiasis    New Patient    HPI: 72 yo M with personal history of kidney stones who presents today to esblish care.  He was followed by Dr. Yves Williams in the remote past but his insurance is no longer accepted there.  He has not seen a urologist in many years.  He does have a personal history of recurrent nephrolithiasis.  He last spontaneously passed a stone springtime this year.  This is associated with some mild flank pain and ultimately saw the stone pass.  He denies any further flank pain after the episode.  He reports he passes a small stone every few years.  He has had ESWL x 2 in the remote past about 10 years ago.    He does express today that he is had chronic low midline back pain for the past 6 months.  He is worried that this may be in his kidneys.  It is exacerbated with activity.  He does have some daytime urinary frequency and nocturia x 1.  He drinks a lot of tea during the day and sometimes 32 oz of water in the AM.    His stone type is unknown.    His wife notes today that he has had increased fatigue and wonders if she he should have his testosterone checked.  He denies any issues with libido. He reports that even if his testosterone is low, he would not want to pursue pharmacotherapy.   PMH: Past Medical History:  Diagnosis Date  . BPH (benign prostatic hyperplasia) 03/18/2015  . Fatigue 03/18/2015  . Hypercholesteremia 03/18/2015  . Kidney stones   . Personal history of kidney stones 07/15/2014  . Tinnitus 06/13/2017    Surgical History: Past Surgical History:  Procedure Laterality Date  . CHOLECYSTECTOMY      Home Medications:  Allergies as of 07/31/2017      Reactions   Penicillin G Benzathine       Medication List    as of 07/31/2017 12:55 PM   You have not  been prescribed any medications.     Allergies:  Allergies  Allergen Reactions  . Penicillin G Benzathine     Family History: Family History  Problem Relation Age of Onset  . Cancer Mother        lung  . Cancer Sister        breast  . Cancer Father        prostate and skin  . Varicose Veins Son   . Cancer Paternal Grandmother        brain    Social History:  reports that he has never smoked. He has never used smokeless tobacco. He reports that he does not drink alcohol or use drugs.  ROS: UROLOGY Frequent Urination?: Yes Hard to postpone urination?: No Burning/pain with urination?: No Get up at night to urinate?: Yes Leakage of urine?: No Urine stream starts and stops?: No Trouble starting stream?: No Do you have to strain to urinate?: No Blood in urine?: No Urinary tract infection?: No Sexually transmitted disease?: No Injury to kidneys or bladder?: No Painful intercourse?: No Weak stream?: Yes Erection problems?: Yes Penile pain?: No  Gastrointestinal Nausea?: No Vomiting?: No Indigestion/heartburn?: No Diarrhea?: No Constipation?: No  Constitutional Fever: No Night sweats?: No Weight loss?: No Fatigue?: Yes  Skin Skin rash/lesions?: No Itching?: No  Eyes Blurred vision?: No Double vision?: No  Ears/Nose/Throat Sore throat?: No Sinus problems?: Yes  Hematologic/Lymphatic Swollen glands?: No Easy bruising?: No  Cardiovascular Leg swelling?: No Chest pain?: No  Respiratory Cough?: No Shortness of breath?: No  Endocrine Excessive thirst?: No  Musculoskeletal Back pain?: Yes Joint pain?: No  Neurological Headaches?: No Dizziness?: No  Psychologic Depression?: No Anxiety?: No  Physical Exam: BP 106/66   Pulse 69   Ht 5\' 11"  (1.803 m)   Wt 196 lb (88.9 kg)   BMI 27.34 kg/m   Constitutional:  Alert and oriented, No acute distress.  Accompanied by wife Darius Williams today. HEENT: [place] AT, moist mucus membranes.  Trachea midline,  no masses. Cardiovascular: No clubbing, cyanosis, or edema. Respiratory: Normal respiratory effort, no increased work of breathing. GI: Abdomen is soft, nontender, nondistended, no abdominal masses GU: No CVA tenderness Skin: No rashes, bruises or suspicious lesions. Neurologic: Grossly intact, no focal deficits, moving all 4 extremities. Psychiatric: Normal mood and affect.  Laboratory Data: Lab Results  Component Value Date   WBC 5.0 06/13/2017   HGB 13.5 06/13/2017   HCT 40.0 06/13/2017   MCV 96 06/13/2017   PLT 134 (L) 06/13/2017    Lab Results  Component Value Date   CREATININE 0.92 06/13/2017    Urinalysis Results for orders placed or performed in visit on 07/31/17  Microscopic Examination  Result Value Ref Range   WBC, UA None seen 0 - 5 /hpf   RBC, UA >30 (H) 0 - 2 /hpf   Epithelial Cells (non renal) None seen 0 - 10 /hpf   Mucus, UA Present (A) Not Estab.   Bacteria, UA None seen None seen/Few  Urinalysis, Complete  Result Value Ref Range   Specific Gravity, UA 1.025 1.005 - 1.030   pH, UA 5.5 5.0 - 7.5   Color, UA Yellow Yellow   Appearance Ur Cloudy (A) Clear   Leukocytes, UA Negative Negative   Protein, UA Trace (A) Negative/Trace   Glucose, UA Negative Negative   Ketones, UA Negative Negative   RBC, UA 3+ (A) Negative   Bilirubin, UA Negative Negative   Urobilinogen, Ur 1.0 0.2 - 1.0 mg/dL   Nitrite, UA Negative Negative   Microscopic Examination See below:     Pertinent Imaging: n/a  Assessment & Plan:    1. Personal history of kidney stones We discussed general stone prevention techniques including drinking plenty water with goal of producing 2.5 L urine daily, increased citric acid intake, avoidance of high oxalate containing foods, and decreased salt intake.  Information about dietary recommendations given today.   - Urinalysis, Complete - DG Abd 1 View; Future  2. Microscopic hematuria We discussed the differential diagnosis for  microscopic hematuria including nephrolithiasis, renal or upper tract tumors, bladder stones, UTIs, or bladder tumors as well as undetermined etiologies. Per AUA guidelines, I did recommend complete microscopic hematuria evaluation including CTU, possible urine cytology, and office cystoscopy.   3. Chronic fatigue Discussed the fatigue is a nonspecific symptom and likely multifactorial Lengthy discussion today, primarily with his wife who is most concerned about all of this We discussed that if he is not interested in treating with testosterone replacement, there is no real indication to check thus will hold off on any further evaluation at this time   Return in about 1 month (around 08/28/2017) for cysto.  Hollice Espy, MD  [facility] [address], [place], [place]  [address] [phone]  

## 2017-07-31 NOTE — Telephone Encounter (Signed)
App made I will notify the patient   Darius Williams

## 2017-08-01 ENCOUNTER — Telehealth: Payer: Self-pay

## 2017-08-01 NOTE — Telephone Encounter (Signed)
-----   Message from Hollice Espy, MD sent at 07/31/2017  9:05 PM EDT ----- No significant stone burden your x-ray.  Even more reason to get a CT scan, this would give [place] a much better idea of the size and number of your stones.  Hollice Espy, MD

## 2017-08-01 NOTE — Telephone Encounter (Signed)
Pt informed, please arrange ct scan

## 2017-08-02 ENCOUNTER — Encounter: Payer: Self-pay | Admitting: Urology

## 2017-08-06 ENCOUNTER — Telehealth: Payer: Self-pay | Admitting: Urology

## 2017-08-06 ENCOUNTER — Other Ambulatory Visit: Payer: Self-pay

## 2017-08-06 DIAGNOSIS — R3129 Other microscopic hematuria: Secondary | ICD-10-CM

## 2017-08-07 ENCOUNTER — Telehealth: Payer: Self-pay | Admitting: Urology

## 2017-08-07 ENCOUNTER — Other Ambulatory Visit
Admission: RE | Admit: 2017-08-07 | Discharge: 2017-08-07 | Disposition: A | Discharge status: Home / Self Care | Payer: Medicare Other | Source: Ambulatory Visit | Attending: Urology | Admitting: Urology

## 2017-08-07 ENCOUNTER — Ambulatory Visit
Admission: RE | Admit: 2017-08-07 | Discharge: 2017-08-07 | Disposition: A | Discharge status: Home / Self Care | Payer: Medicare Other | Source: Ambulatory Visit | Attending: Urology | Admitting: Urology

## 2017-08-07 DIAGNOSIS — N200 Calculus of kidney: Secondary | ICD-10-CM | POA: Insufficient documentation

## 2017-08-07 DIAGNOSIS — K4090 Unilateral inguinal hernia, without obstruction or gangrene, not specified as recurrent: Secondary | ICD-10-CM | POA: Diagnosis not present

## 2017-08-07 DIAGNOSIS — R3129 Other microscopic hematuria: Secondary | ICD-10-CM

## 2017-08-07 DIAGNOSIS — N210 Calculus in bladder: Secondary | ICD-10-CM | POA: Insufficient documentation

## 2017-08-07 DIAGNOSIS — Z9049 Acquired absence of other specified parts of digestive tract: Secondary | ICD-10-CM | POA: Insufficient documentation

## 2017-08-07 DIAGNOSIS — K862 Cyst of pancreas: Secondary | ICD-10-CM | POA: Diagnosis not present

## 2017-08-07 LAB — BUN: BUN: 15 mg/dL (ref 8–23)

## 2017-08-07 LAB — CREATININE, SERUM
Creatinine, Ser: 0.94 mg/dL (ref 0.61–1.24)
GFR calc Af Amer: >60 mL/min (ref >60)
GFR calc non Af Amer: >60 mL/min (ref >60)

## 2017-08-07 MED ORDER — IOPAMIDOL (ISOVUE-300) INJECTION 61%
150.0000 mL | Freq: Once | INTRAVENOUS | Status: AC | PRN
  Administered 2017-08-07: 125 mL via INTRAVENOUS

## 2017-08-07 NOTE — Telephone Encounter (Signed)
Pt wife called LMOM stating that husband, Pt, had CT done today and is now passing a lot of blood in his urine, Wife, June wanted to let someone know and states she would appreciate a phone call today to let her know what she needs to do. Please call either cell @ [phone] or Home # [phone]. Please advise. Thanks.

## 2017-08-07 NOTE — Telephone Encounter (Signed)
Pt wife is wanting to know if they can have his results from his CT scan today. Pt has noticed blood in his urine and they are concerned. They don't have a follow-up until 09/05/17.

## 2017-08-08 ENCOUNTER — Telehealth: Payer: Self-pay

## 2017-08-08 NOTE — Telephone Encounter (Signed)
Please see results note.

## 2017-08-08 NOTE — Telephone Encounter (Signed)
Left message

## 2017-08-08 NOTE — Telephone Encounter (Signed)
-----   Message from Hollice Espy, MD sent at 08/08/2017  3:10 PM EDT ----- I will review the CT scan in more detail in person.  The most important things are that he has some bladder stones which are likely the cause of his blood.  Drink more water.  We will address this at his follow-up visit.  He has a few other incidental findings which we will discuss in the office.  Hollice Espy, MD

## 2017-08-08 NOTE — Telephone Encounter (Signed)
Pt wife informed

## 2017-08-27 ENCOUNTER — Telehealth: Payer: Self-pay | Admitting: Urology

## 2017-08-27 DIAGNOSIS — Z125 Encounter for screening for malignant neoplasm of prostate: Secondary | ICD-10-CM | POA: Diagnosis not present

## 2017-08-27 DIAGNOSIS — R350 Frequency of micturition: Secondary | ICD-10-CM | POA: Diagnosis not present

## 2017-08-27 DIAGNOSIS — N401 Enlarged prostate with lower urinary tract symptoms: Secondary | ICD-10-CM | POA: Diagnosis not present

## 2017-08-27 DIAGNOSIS — R3914 Feeling of incomplete bladder emptying: Secondary | ICD-10-CM | POA: Diagnosis not present

## 2017-08-27 DIAGNOSIS — N210 Calculus in bladder: Secondary | ICD-10-CM | POA: Diagnosis not present

## 2017-08-27 NOTE — Telephone Encounter (Signed)
Pt's wife stopped by office to cancel appt on 9/4 for cysto/CT results.  They didn't want to reschedule.  Just F.Y.I.

## 2017-08-31 ENCOUNTER — Telehealth: Payer: Self-pay

## 2017-08-31 NOTE — Telephone Encounter (Signed)
Called pt no answer. LM for pt informing him of the need to f/u with PCP or another urologist.

## 2017-08-31 NOTE — Telephone Encounter (Signed)
-----   Message from Hollice Espy, MD sent at 08/31/2017  1:02 PM EDT ----- I was informed this patient canceled his follow-up with me.  Please ensure that he follows up with his primary care for the incidental findings and a urologist to discuss his bladder stones.  These need to be addressed.  Hollice Espy, MD

## 2017-09-05 ENCOUNTER — Other Ambulatory Visit: Payer: Medicare Other | Admitting: Urology

## 2017-09-05 ENCOUNTER — Encounter

## 2017-09-11 DIAGNOSIS — R3914 Feeling of incomplete bladder emptying: Secondary | ICD-10-CM | POA: Diagnosis not present

## 2017-09-11 DIAGNOSIS — N401 Enlarged prostate with lower urinary tract symptoms: Secondary | ICD-10-CM | POA: Diagnosis not present

## 2017-09-19 DIAGNOSIS — N401 Enlarged prostate with lower urinary tract symptoms: Secondary | ICD-10-CM | POA: Diagnosis not present

## 2017-09-19 DIAGNOSIS — N210 Calculus in bladder: Secondary | ICD-10-CM | POA: Diagnosis not present

## 2017-09-21 DIAGNOSIS — N210 Calculus in bladder: Secondary | ICD-10-CM | POA: Diagnosis not present

## 2017-09-21 DIAGNOSIS — N401 Enlarged prostate with lower urinary tract symptoms: Secondary | ICD-10-CM | POA: Diagnosis not present

## 2017-10-23 ENCOUNTER — Inpatient Hospital Stay
Admission: RE | Admit: 2017-10-23 | Discharge: 2017-10-23 | Disposition: A | Discharge status: Home / Self Care | Payer: PRIVATE HEALTH INSURANCE | Source: Ambulatory Visit

## 2017-10-23 NOTE — Pre-Procedure Instructions (Signed)
NM myocardial perfusion SPECT multiple (stress and rest)06/12/2017 [place] Result Impression   Normal myocardial perfusion scan no evidence of stress-induced  myocardial ischemia ejection fraction 54% with mild left ventricular  enlargement conclusion low risk scan  Other Result Information  This result has an attachment that is not available.  Result Narrative  CARDIOLOGY DEPARTMENT [facility] [address] 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 Lujean Amel   Clinical History: 72 y.o. year old male recent anginal symptoms Vitals: Height: 71 inWeight: 200 lb Cardiac risk factors include:  UNKNOWN    Procedure: The patient performed treadmill exercise using a Bruce protocol for 5:34  minutes. The exercise test was stopped due to fatigue.Blood pressure  response was hypotensive. The patient developed symptoms other than  fatigue during the procedure; specific symptoms included PRESYNCOPE AND  DECREASED HEART RATE WITH EXERTION.  Rest HR: 60bpm Rest BP: 110/58mmHg Max HR: 120bpm Max BP: 82/17mmHg Mets: 7.00 % MAX HR: 80%  Stress Test Administered by: Oswald Hillock, CMA  ECG Interpretation: Rest TIR:WERXVQ sinus rhythm, none Stress MGQ:QPYPP tachycardia,  Recovery JKD:TOIZTI sinus rhythm ECG Interpretation:negative, nondiagnostic changes.   Administrations This Visit  technetium Tc68m sestamibi (CARDIOLITE) injection 45.80 millicurie  Admin Date 99/83/3825 Action Given Dose 05.39 millicurie Route Intravenous Administered By Herbert Seta, CNMT     technetium Tc8m sestamibi (CARDIOLITE) injection 76.73 millicurie  Admin Date 41/93/7902 Action Given Dose 40.97 millicurie Route Intravenous Administered By Herbert Seta, CNMT       Gated post-stress perfusion imaging was performed 30 minutes after stress.  Rest images were performed 30 minutes after injection.  Gated LV Analysis:  TID:1.01  LVEF= 54 %  FINDINGS: Regional wall motion:reveals normal myocardial thickening and wall  motion. The overall quality of the study is good. Artifacts noted: no Left ventricular cavity: enlarged.  Perfusion Analysis:SPECT images demonstrate homogeneous tracer  distribution throughout the myocardium.  Status Results Details   Encounter Summary

## 2017-10-23 NOTE — Pre-Procedure Instructions (Signed)
Echo complete6/13/2019 [place] Component Name Value Ref Range  LV Ejection Fraction (%) 55   Aortic Valve Regurgitation Grade none   Aortic Valve Stenosis Grade none   Aortic Valve Max Velocity (m/s) 1.1 m/sec  Aortic Valve Stenosis Mean Gradient (mmHg) 2.3 mmHg  Mitral Valve Regurgitation Grade trivial   Mitral Valve Stenosis Grade none   Tricuspid Valve Regurgitation Grade trivial   Tricuspid Valve Regurgitation Max Velocity (m/s) 2.2 m/sec  Right Ventricle Systolic Pressure (mmHg) 62.0 mmHg  LV End Diastolic Diameter (cm) 5.2 cm  LV End Systolic Diameter (cm) 3.8 cm  LV Septum Wall Thickness (cm) 1.1 cm  LV Posterior Wall Thickness (cm) 1 cm  Left Atrium Diameter (cm) 4.1 cm  Result Narrative   CARDIOLOGY DEPARTMENT [place] [place] [id number] Acct #: [id number] [place], [place], [place] [address] Date: 06/11/2017 10: 14 AM  Adult MaleAge: 72 yrs ECHOCARDIOGRAM REPORTOutpatient  KC^^KCWC  STUDY:CHEST WALL TAPE:0000: 00: 0: 00: 00 MD1: Callwood, Dwayne, MD ECHO:YesDOPPLER:Yes FILE:0000-000-000BP: 110/60 mmHg  COLOR:Yes CONTRAST:No MACHINE:Philips  RV BIOPSY:No3D:NoSOUND QLTY:ModerateHeight: 71 in MEDIUM:NoneWeight: 200 lb  BSA: 2.1 m2 _________________________________________________________________________________________ HISTORY: DOE, Chest pain  REASON: Assess, LV function  INDICATION: I20.8  Other forms of angina pectoris, R06.02 Shortness of breath _________________________________________________________________________________________ ECHOCARDIOGRAPHIC MEASUREMENTS 2D DIMENSIONS AORTAValues Normal Range MAIN PA ValuesNormal Range Annulus: nm*[2.3-2.9] PA Main: nm* [1.5-2.1] Aorta Sin: 4.4 cm [3.1-3.7]RIGHT VENTRICLE ST Junction: nm*[2.6-3.2] RV Base: nm* [<4.2] Asc.Aorta: 3.7 cm [2.6-3.4]RV Mid: nm* [<3.5] LEFT VENTRICLERV Length: nm* [<8.6] LVIDd: 5.2 cm [4.2-5.9]INFERIOR VENA CAVA LVIDs: 3.8 cmMax. IVC: nm* [<=2.1]  FS: 27.1 % [>25]Min. IVC: nm* SWT: 1.1 cm [0.6-1.0]------------------ PWT: 1.00 cm[0.6-1.0]nm* - not measured LEFT ATRIUM LA Diam: 4.1 cm [3.0-4.0] LA A4C Area: nm*[<20] LA Volume: CB*[63-84] _________________________________________________________________________________________ ECHOCARDIOGRAPHIC DESCRIPTIONS AORTIC ROOT  Size: MODERATELY DILATED  Dissection: No dissection AORTIC VALVE  Leaflets: Tricuspid Morphology: Normal  Mobility: Fully mobile LEFT VENTRICLE  Size: NormalAnterior: Normal Contraction: Normal Lateral: Normal  Closest EF: >55% (Estimated)Septal: Normal LV Masses: No Masses Apical: Normal LVH: NoneInferior:  Normal Posterior: Normal  Dias.FxClass: (Grade 1) relaxation abnormal, E/A reversal MITRAL VALVE  Leaflets: NormalMobility: Fully mobile  Morphology: ANNULAR CALC LEFT ATRIUM  Size: Normal LA Masses: No masses IA Septum: Normal IAS MAIN PA  Size: Normal PULMONIC VALVE  Morphology: NormalMobility: Fully mobile RIGHT VENTRICLE RV Masses: No Masses Size: Normal Free Wall: Normal Contraction: Normal TRICUSPID VALVE  Leaflets: NormalMobility: Fully mobile  Morphology: Normal RIGHT ATRIUM  Size: NormalRA Other: None RA Mass: No masses PERICARDIUM Fluid: No effusion INFERIOR VENACAVA  Size: Not seen Not Seen _________________________________________________________________________________________  DOPPLER ECHO and OTHER SPECIAL PROCEDURES  Aortic: No ARNo AS  114.8 cm/sec peak vel5.3 mmHg peak grad  2.3 mmHg mean grad 3.2 cm^2 by DOPPLER  Mitral: TRIVIAL MR No MS  MV Inflow E Vel = 69.4 cm/sec MV Annulus E'Vel = 6.0 cm/sec  E/E'Ratio = 11.6 Tricuspid: TRIVIAL TR No TS  224.0 cm/sec peak TR vel 25.1 mmHg peak RV pressure Pulmonary: TRIVIAL PR No PS _________________________________________________________________________________________ INTERPRETATION NORMAL LEFT VENTRICULAR SYSTOLIC FUNCTION WITH AN ESTIMATED EF = 55 % NORMAL RIGHT  VENTRICULAR SYSTOLIC FUNCTION TRIVIAL REGURGITATION NOTED (See above) NO VALVULAR STENOSIS MODERATELY DILATED AORTIC ROOT _________________________________________________________________________________________ Electronically signed Cena Benton, MD on 06/14/2017 02: 30 PM  Performed By: Johnathan Hausen, RDCS, RVT  Ordering Physician: Lujean Amel, MD _________________________________________________________________________________________  Other Result Information  Interface, Text Results In - 06/14/2017  2:53 PM EDT  CARDIOLOGY DEPARTMENT                 HARVEL, MESKILL           [place]                                    [place] #: [id number]           [place], [place], [place] [address]       Date: 06/11/2017 10: 63 AM                                                              Adult   Male  Age: 72 yrs           ECHOCARDIOGRAM REPORT                              Outpatient                                                              KC^^KCWC      STUDY:CHEST WALL               TAPE:0000: 00: 0: 00: 00 MD1: Callwood, Dwayne, MD       ECHO:Yes    DOPPLER:Yes       FILE:0000-000-000        BP: 110/60 mmHg      COLOR:Yes   CONTRAST:No     MACHINE:Philips  RV BIOPSY:No          3D:No  SOUND QLTY:Moderate            Height: 71 in     MEDIUM:None                                              Weight: 200 lb                                                              BSA: 2.1 m2 _________________________________________________________________________________________               HISTORY: DOE, Chest pain                REASON: Assess, LV function            INDICATION: I20.8 Other forms of angina pectoris, R06.02 Shortness of breath _________________________________________________________________________________________ ECHOCARDIOGRAPHIC MEASUREMENTS 2D  DIMENSIONS AORTA  Values   Normal Range   MAIN PA         Values    Normal Range               Annulus: nm*          [2.3-2.9]         PA Main: nm*       [1.5-2.1]             Aorta Sin: 4.4 cm       [3.1-3.7]    RIGHT VENTRICLE           ST Junction: nm*          [2.6-3.2]         RV Base: nm*       [<4.2]             Asc.Aorta: 3.7 cm       [2.6-3.4]          RV Mid: nm*       [<3.5] LEFT VENTRICLE                                      RV Length: nm*       [<8.6]                 LVIDd: 5.2 cm       [4.2-5.9]    INFERIOR VENA CAVA                 LVIDs: 3.8 cm                        Max. IVC: nm*       [<=2.1]                    FS: 27.1 %       [>25]            Min. IVC: nm*                   SWT: 1.1 cm       [0.6-1.0]    ------------------                   PWT: 1.00 cm      [0.6-1.0]    nm* - not measured LEFT ATRIUM               LA Diam: 4.1 cm       [3.0-4.0]           LA A4C Area: nm*          [<20]             LA Volume: nm*          [18-58] _________________________________________________________________________________________ ECHOCARDIOGRAPHIC DESCRIPTIONS AORTIC ROOT                  Size: MODERATELY DILATED            Dissection: No dissection AORTIC VALVE              Leaflets: Tricuspid                   Morphology: Normal              Mobility: Fully mobile LEFT VENTRICLE  Size: Normal                        Anterior: Normal           Contraction: Normal                         Lateral: Normal            Closest EF: >55% (Estimated)                Septal: Normal             LV Masses: No Masses                       Apical: Normal                   LVH: None                          Inferior: Normal                                                     Posterior: Normal          Dias.FxClass: (Grade 1) relaxation abnormal, E/A reversal MITRAL VALVE              Leaflets: Normal                        Mobility: Fully mobile             Morphology: ANNULAR CALC LEFT ATRIUM                  Size: Normal                       LA Masses: No masses             IA Septum: Normal IAS MAIN PA                  Size: Normal PULMONIC VALVE            Morphology: Normal                        Mobility: Fully mobile RIGHT VENTRICLE             RV Masses: No Masses                         Size: Normal             Free Wall: Normal                     Contraction: Normal TRICUSPID VALVE              Leaflets: Normal                        Mobility: Fully mobile            Morphology: Normal RIGHT ATRIUM                  Size: Normal  RA Other: None               RA Mass: No masses PERICARDIUM                 Fluid: No effusion INFERIOR VENACAVA                  Size: Not seen Not Seen _________________________________________________________________________________________  DOPPLER ECHO and OTHER SPECIAL PROCEDURES                Aortic: No AR                      No AS                        114.8 cm/sec peak vel      5.3 mmHg peak grad                        2.3 mmHg mean grad         3.2 cm^2 by DOPPLER                Mitral: TRIVIAL MR                 No MS                        MV Inflow E Vel = 69.4 cm/sec     MV Annulus E'Vel = 6.0 cm/sec                        E/E'Ratio = 11.6             Tricuspid: TRIVIAL TR                 No TS                        224.0 cm/sec peak TR vel   25.1 mmHg peak RV pressure             Pulmonary: TRIVIAL PR                 No PS _________________________________________________________________________________________ INTERPRETATION NORMAL LEFT VENTRICULAR SYSTOLIC FUNCTION WITH AN ESTIMATED EF = 55 % NORMAL RIGHT VENTRICULAR SYSTOLIC FUNCTION TRIVIAL REGURGITATION NOTED (See above) NO VALVULAR STENOSIS MODERATELY DILATED AORTIC ROOT _________________________________________________________________________________________ Electronically signed by    Lujean Amel, MD on 06/14/2017 02: 53 PM          Performed By: Johnathan Hausen, RDCS, RVT    Ordering Physician: Lujean Amel, MD _________________________________________________________________________________________  Status Results Details   Encounter Summary

## 2017-10-23 NOTE — Pre-Procedure Instructions (Signed)
Progress Notes - documented in this encounter  Darius Gibbon, MD - 06/14/2017 4:00 PM EDT Formatting of this note might be different from the original. Established Patient Visit   Chief Complaint: Chief Complaint  Patient presents with  . Follow-up  from test 6/10  Date of Service: 06/14/2017 Date of Birth: [birth year] PCP: Darius Maple, MD  History of Present Illness: Darius Williams is a 72 y.o.male patient who presents for routine follow-up patient initially complained of trouble with his blood pressure as well as possible chest pain anginal symptoms. Patient recently underwent echocardiogram and Myoview as an outpatient both studies are reasonably unremarkable stenotic patient is being treated conservatively medically. He has had a little bit of a cough as well as sinus congestion probably related to the surrounding pollen. Patient complains of some fatigue he started exercising more by walking 30 minutes a day and I advised him to gradually increase it to walk up to 2 miles a day  Past Medical and Surgical History  Past Medical History History reviewed. No pertinent past medical history.  Past Surgical History He has a past surgical history that includes Colonoscopy (04/09/2001); Cholecystectomy open (1985); and Colonoscopy (08/22/2016).   Medications and Allergies  Current Medications  No current outpatient medications on file.   No current facility-administered medications for this visit.   Allergies: Penicillins  Social and Family History  Social History reports that he has never smoked. He has never used smokeless tobacco. He reports that he does not drink alcohol or use drugs.  Family History Family History  Problem Relation Age of Onset  . Cancer Mother  cancer  . No Known Problems Father   Review of Systems   Review of Systems: The patient denies chest pain, shortness of breath, orthopnea, paroxysmal nocturnal dyspnea, pedal edema, palpitations, heart  racing, presyncope, syncope. Review of 12 Systems is negative except as described above.  Physical Examination   Vitals:BP 112/60 (BP Location: Left upper arm, Patient Position: Sitting, BP Cuff Size: Adult)  Pulse 73  Wt 90.3 kg (199 lb)  SpO2 95%  BMI 27.75 kg/m  Ht: Wt:90.3 kg (199 lb) ZTI:WPYK surface area is 2.13 meters squared. Body mass index is 27.75 kg/m.  HEENT: Pupils equally reactive to light and accomodation  Neck: Supple without thyromegaly, carotid pulses 2+ Lungs: clear to auscultation bilaterally; no wheezes, rales, rhonchi Heart: Regular rate and rhythm. No gallops, murmurs or rub Abdomen: soft nontender, nondistended, with normal bowel sounds Extremities: no cyanosis, clubbing, or edema Peripheral Pulses: 2+ in all extremities, 2+ femoral pulses bilaterally  Assessment   72 y.o. male with  1. SOB (shortness of breath)  2. 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 Recurrent cough  4. Sinusitis, unspecified chronicity, unspecified location  5. Allergic rhinitis, unspecified seasonality, unspecified trigger  6. Obesity, unspecified classification, unspecified obesity type, unspecified whether serious comorbidity present  7. Other secondary osteoarthritis of both knees  8. Fatigue, unspecified type   Plan  1 atypical chest pain symptoms thought to be angina underwent functional study with Myoview which is unremarkable recommend conservative medical therapy 2 dyspnea shortness of breath recommend continue aerobic exercise advised patient to walk around 2 miles a day 3 recurrent cough unclear etiology may be related to allergies continue over-the-counter medications 4 borderline obesity recommend modest weight loss exercise portion control 5 generalized fatigue mild would recommend conservative therapy including aerobic exercise 6 have the patient follow-up in 1 year  No follow-ups on file.  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)  Other and unspecified angina pectoris   Recurrent cough   Sinusitis, unspecified chronicity, unspecified location   Allergic rhinitis, unspecified seasonality, unspecified trigger   Obesity, unspecified classification, unspecified obesity type, unspecified whether serious comorbidity present   Other secondary osteoarthritis of both knees   Fatigue, unspecified type   Images Patient Contacts   Contact Name Contact Address Communication Relationship to Patient  [place], [place] [address] [phone] [facility]) [phone] (Mobile) Spouse, Emergency Contact  Document Information  Primary Care Provider Other Service Providers Document Coverage Dates  Darius Maple, MD (Jan. 21, 2016January 21, 2016 - Present) DM: [id number] [phone] (Work) [phone] (Fax)  Family Medicine  Jun. 13, 2019June 13, 2019   [facility] [address] [place], [place] [address]   Encounter Providers Encounter Date  Darius Aida Raider, MD (Attending) DM: [phone] (Work) [phone] (Fax) [place], [place] [address] Cardiovascular Disease Jun. 13, 2019June 13, 2019

## 2017-10-24 ENCOUNTER — Inpatient Hospital Stay: Admission: RE | Admit: 2017-10-24 | Payer: PRIVATE HEALTH INSURANCE | Source: Ambulatory Visit

## 2017-10-24 NOTE — H&P (Signed)
NAME: Darius Williams, Darius Williams MEDICAL RECORD [id number] ACCOUNT [id number] DATE OF BIRTH:04-16-1945 FACILITY: [facility] LOCATION: [facility] PHYSICIAN:Brinley Treanor Farrel Conners, MD  HISTORY AND PHYSICAL  DATE OF ADMISSION:  10/30/2017  CHIEF COMPLAINT:  Difficulty voiding.  HISTORY OF PRESENT ILLNESS:  The patient is a 72 year old white male with a long history of significant lower urinary tract symptoms.  Evaluation in the office indicated that he had a 51.7 gram prostate with lateral lobe hypertrophy and a 10 mm bladder  stone.  Uroflow indicated a maximum flow rate of 10 mL per second and a postvoid residual of 137 mL.  LABORATORY STUDIES:  Include a PSA of 1.1 ng/mL and a creatinine of 0.86 mg/dL.  The patient comes in now for photovaporization of prostate with GreenLight laser and holmium laser lithotripsy of bladder stone.  ALLERGIES:  PENICILLIN.  CURRENT MEDICATIONS:  Include tamsulosin.  PAST SURGICAL HISTORY:  Cholecystectomy in 1985.  ESWL 2009.  PAST AND  CURRENT MEDICAL CONDITIONS:  Kidney stones.  REVIEW OF SYSTEMS:  The patient denied chest pain, shortness of breath, heart disease, stroke, diabetes or hypertension.  He does have decreased auditory acuity.  SOCIAL HISTORY:  The patient denied tobacco or alcohol use.  FAMILY HISTORY:  Father died at age 4 of old age.  Mother died of lung cancer at age 82.  The patient has a brother, age 35, with kidney stones.  PHYSICAL EXAMINATION: GENERAL:  A well-nourished, white male in no acute distress. HEENT:  Sclerae were clear. NECK:  Supple, no palpable cervical adenopathy.  No audible carotid bruits. PULMONARY:  Lungs clear to auscultation. CARDIOVASCULAR:  Regular rhythm and rate without audible murmurs or gallops. GASTROINTESTINAL:  Soft.  No palpable abdominal masses. GENITOURINARY:  Uncircumcised.  Testes were smooth and nontender. RECTAL:  50 gram smooth, nontender prostate. NEUROMUSCULAR:  Alert and oriented  x3.  IMPRESSION: 1.  Benign prostatic hypertrophy with bladder outlet obstruction. 2.  Bladder stone.  PLAN:  Photovaporization of prostate with GreenLight laser and holmium laser lithotripsy of bladder stone.  LN/NUANCE  D:10/24/2017 T:10/24/2017 JOB:[id number]

## 2017-10-25 ENCOUNTER — Inpatient Hospital Stay: Admission: RE | Admit: 2017-10-25 | Payer: PRIVATE HEALTH INSURANCE | Source: Ambulatory Visit

## 2017-10-26 ENCOUNTER — Encounter
Admission: RE | Admit: 2017-10-26 | Discharge: 2017-10-26 | Disposition: A | Discharge status: Home / Self Care | Payer: Medicare Other | Source: Ambulatory Visit | Attending: Urology | Admitting: Urology

## 2017-10-26 ENCOUNTER — Other Ambulatory Visit: Payer: Self-pay

## 2017-10-26 HISTORY — DX: Malignant (primary) neoplasm, unspecified: C80.1

## 2017-10-26 HISTORY — DX: Personal history of urinary calculi: Z87.442

## 2017-10-26 NOTE — Patient Instructions (Signed)
Your procedure is scheduled on: 10-30-17  Report to Same Day Surgery 2nd floor medical mall [facility] Entrance-take elevator on left to 2nd floor.  Check in with surgery information desk.) To find out your arrival time please call [phone] between 1PM - 3PM on 10-29-17  Remember: Instructions that are not followed completely may result in serious medical risk, up to and including death, or upon the discretion of your surgeon and anesthesiologist your surgery may need to be rescheduled.    _x___ 1. Do not eat food after midnight the night before your procedure. You may drink clear liquids up to 2 hours before you are scheduled to arrive at the hospital for your procedure.  Do not drink clear liquids within 2 hours of your scheduled arrival to the hospital.  Clear liquids include  --Water or Apple juice without pulp  --Clear carbohydrate beverage such as ClearFast or Gatorade  --Black Coffee or Clear Tea (No milk, no creamers, do not add anything to the coffee or Tea   ____Ensure clear carbohydrate drink on the way to the hospital for bariatric patients  ____Ensure clear carbohydrate drink 3 hours before surgery for Dr Dwyane Luo patients if physician instructed.     __x__ 2. No Alcohol for 24 hours before or after surgery.   __x__3. No Smoking or e-cigarettes for 24 prior to surgery.  Do not use any chewable tobacco products for at least 6 hour prior to surgery   ____  4. Bring all medications with you on the day of surgery if instructed.    __x__ 5. Notify your doctor if there is any change in your medical condition     (cold, fever, infections).    x___6. On the morning of surgery brush your teeth with toothpaste and water.  You may rinse your mouth with mouth wash if you wish.  Do not swallow any toothpaste or mouthwash.   Do not wear jewelry, make-up, hairpins, clips or nail polish.  Do not wear lotions, powders, or perfumes. You may wear deodorant.  Do not shave 48 hours  prior to surgery. Men may shave face and neck.  Do not bring valuables to the hospital.    [facility] is not responsible for any belongings or valuables.               Contacts, dentures or bridgework may not be worn into surgery.  Leave your suitcase in the car. After surgery it may be brought to your room.  For patients admitted to the hospital, discharge time is determined by your treatment team.  _  Patients discharged the day of surgery will not be allowed to drive home.  You will need someone to drive you home and stay with you the night of your procedure.    Please read over the following fact sheets that you were given:   [facility] Preparing for Surgery  ____ Take anti-hypertensive listed below, cardiac, seizure, asthma, anti-reflux and psychiatric medicines. These include:  1. NONE  2.  3.  4.  5.  6.  ____Fleets enema or Magnesium Citrate as directed.   ____ Use CHG Soap or sage wipes as directed on instruction sheet   ____ Use inhalers on the day of surgery and bring to hospital day of surgery  ____ Stop Metformin and Janumet 2 days prior to surgery.    ____ Take 1/2 of usual insulin dose the night before surgery and none on the morning surgery.   ____ Follow recommendations  from Cardiologist, Pulmonologist or PCP regarding stopping Aspirin, Coumadin, Plavix ,Eliquis, Effient, or Pradaxa, and Pletal.  X____Stop Anti-inflammatories such as Advil, Aleve, Ibuprofen, Motrin, Naproxen, Naprosyn, Goodies powders or aspirin products NOW- OK to take Tylenol   ____ Stop supplements until after surgery.     ____ Bring C-Pap to the hospital.

## 2017-10-30 ENCOUNTER — Ambulatory Visit
Admission: RE | Admit: 2017-10-30 | Discharge: 2017-10-30 | Disposition: A | Discharge status: Home / Self Care | Payer: Medicare Other | Source: Ambulatory Visit | Attending: Urology | Admitting: Urology

## 2017-10-30 ENCOUNTER — Ambulatory Visit: Payer: Medicare Other | Admitting: Anesthesiology

## 2017-10-30 ENCOUNTER — Other Ambulatory Visit: Payer: Self-pay

## 2017-10-30 ENCOUNTER — Encounter
Admission: RE | Disposition: A | Discharge status: Home / Self Care | Payer: Self-pay | Source: Ambulatory Visit | Attending: Urology

## 2017-10-30 DIAGNOSIS — Z85828 Personal history of other malignant neoplasm of skin: Secondary | ICD-10-CM | POA: Diagnosis not present

## 2017-10-30 DIAGNOSIS — Z880 Allergy status to penicillin: Secondary | ICD-10-CM | POA: Insufficient documentation

## 2017-10-30 DIAGNOSIS — N210 Calculus in bladder: Secondary | ICD-10-CM | POA: Diagnosis not present

## 2017-10-30 DIAGNOSIS — N138 Other obstructive and reflux uropathy: Secondary | ICD-10-CM | POA: Diagnosis not present

## 2017-10-30 DIAGNOSIS — N401 Enlarged prostate with lower urinary tract symptoms: Secondary | ICD-10-CM | POA: Insufficient documentation

## 2017-10-30 DIAGNOSIS — N400 Enlarged prostate without lower urinary tract symptoms: Secondary | ICD-10-CM | POA: Diagnosis not present

## 2017-10-30 DIAGNOSIS — N320 Bladder-neck obstruction: Secondary | ICD-10-CM | POA: Diagnosis not present

## 2017-10-30 HISTORY — PX: GREEN LIGHT LASER TURP (TRANSURETHRAL RESECTION OF PROSTATE: SHX6260

## 2017-10-30 HISTORY — PX: CYSTOSCOPY WITH LITHOLAPAXY: SHX1425

## 2017-10-30 HISTORY — PX: HOLMIUM LASER APPLICATION: SHX5852

## 2017-10-30 LAB — PLATELET COUNT: Platelets: 141 10*3/uL — ABNORMAL LOW (ref 150–400)

## 2017-10-30 SURGERY — GREEN LIGHT LASER TURP (TRANSURETHRAL RESECTION OF PROSTATE
Anesthesia: General | Site: Prostate

## 2017-10-30 MED ORDER — PROPOFOL 10 MG/ML IV BOLUS
INTRAVENOUS | Status: DC | PRN
  Administered 2017-10-30: 160 mg via INTRAVENOUS

## 2017-10-30 MED ORDER — PROMETHAZINE HCL 25 MG/ML IJ SOLN: 6.2500 mg | INTRAMUSCULAR | Status: DC | PRN

## 2017-10-30 MED ORDER — LEVOFLOXACIN IN D5W 500 MG/100ML IV SOLN
INTRAVENOUS | Status: AC
  Filled 2017-10-30: qty 100

## 2017-10-30 MED ORDER — FAMOTIDINE 20 MG PO TABS
20.0000 mg | ORAL_TABLET | Freq: Once | ORAL | Status: AC
  Administered 2017-10-30: 20 mg via ORAL

## 2017-10-30 MED ORDER — URIBEL 118 MG PO CAPS: 1.0000 | ORAL_CAPSULE | Freq: Four times a day (QID) | ORAL | 3 refills | Status: DC | PRN

## 2017-10-30 MED ORDER — OXYCODONE HCL 5 MG/5ML PO SOLN: 5.0000 mg | Freq: Once | ORAL | Status: DC | PRN

## 2017-10-30 MED ORDER — LIDOCAINE HCL (CARDIAC) PF 100 MG/5ML IV SOSY
PREFILLED_SYRINGE | INTRAVENOUS | Status: DC | PRN
  Administered 2017-10-30: 100 mg via INTRAVENOUS

## 2017-10-30 MED ORDER — PROPOFOL 10 MG/ML IV BOLUS
INTRAVENOUS | Status: AC
  Filled 2017-10-30: qty 20

## 2017-10-30 MED ORDER — FENTANYL CITRATE (PF) 100 MCG/2ML IJ SOLN: 25.0000 ug | INTRAMUSCULAR | Status: DC | PRN

## 2017-10-30 MED ORDER — PHENYLEPHRINE HCL 10 MG/ML IJ SOLN
INTRAMUSCULAR | Status: DC | PRN
  Administered 2017-10-30 (×6): 50 ug via INTRAVENOUS

## 2017-10-30 MED ORDER — BELLADONNA ALKALOIDS-OPIUM 16.2-60 MG RE SUPP
RECTAL | Status: AC
  Filled 2017-10-30: qty 1

## 2017-10-30 MED ORDER — LIDOCAINE HCL URETHRAL/MUCOSAL 2 % EX GEL
CUTANEOUS | Status: DC | PRN
  Administered 2017-10-30: 1 via URETHRAL

## 2017-10-30 MED ORDER — OXYCODONE HCL 5 MG PO TABS: 5.0000 mg | ORAL_TABLET | Freq: Once | ORAL | Status: DC | PRN

## 2017-10-30 MED ORDER — MEPERIDINE HCL 50 MG/ML IJ SOLN: 6.2500 mg | INTRAMUSCULAR | Status: DC | PRN

## 2017-10-30 MED ORDER — FAMOTIDINE 20 MG PO TABS
ORAL_TABLET | ORAL | Status: AC
  Administered 2017-10-30: 20 mg via ORAL
  Filled 2017-10-30: qty 1

## 2017-10-30 MED ORDER — ONDANSETRON HCL 4 MG/2ML IJ SOLN
INTRAMUSCULAR | Status: DC | PRN
  Administered 2017-10-30: 4 mg via INTRAVENOUS

## 2017-10-30 MED ORDER — FENTANYL CITRATE (PF) 100 MCG/2ML IJ SOLN
INTRAMUSCULAR | Status: AC
  Filled 2017-10-30: qty 2

## 2017-10-30 MED ORDER — BELLADONNA ALKALOIDS-OPIUM 16.2-60 MG RE SUPP
RECTAL | Status: DC | PRN
  Administered 2017-10-30: 1 via RECTAL

## 2017-10-30 MED ORDER — ONDANSETRON HCL 4 MG/2ML IJ SOLN
INTRAMUSCULAR | Status: AC
  Filled 2017-10-30: qty 2

## 2017-10-30 MED ORDER — LEVOFLOXACIN IN D5W 500 MG/100ML IV SOLN
500.0000 mg | Freq: Once | INTRAVENOUS | Status: AC
  Administered 2017-10-30: 500 mg via INTRAVENOUS

## 2017-10-30 MED ORDER — LIDOCAINE HCL (PF) 2 % IJ SOLN
INTRAMUSCULAR | Status: AC
  Filled 2017-10-30: qty 10

## 2017-10-30 MED ORDER — FENTANYL CITRATE (PF) 100 MCG/2ML IJ SOLN
INTRAMUSCULAR | Status: DC | PRN
  Administered 2017-10-30: 25 ug via INTRAVENOUS
  Administered 2017-10-30: 50 ug via INTRAVENOUS
  Administered 2017-10-30: 25 ug via INTRAVENOUS

## 2017-10-30 MED ORDER — LACTATED RINGERS IV SOLN
INTRAVENOUS | Status: DC
  Administered 2017-10-30: 13:00:00 via INTRAVENOUS

## 2017-10-30 MED ORDER — CIPROFLOXACIN HCL 500 MG PO TABS: 500.0000 mg | ORAL_TABLET | Freq: Two times a day (BID) | ORAL | 0 refills | Status: DC

## 2017-10-30 MED ORDER — DOCUSATE SODIUM 100 MG PO CAPS: 200.0000 mg | ORAL_CAPSULE | Freq: Two times a day (BID) | ORAL | 3 refills | Status: DC

## 2017-10-30 SURGICAL SUPPLY — 34 items
ADAPTER IRRIG TUBE 2 SPIKE SOL (ADAPTER) ×6 IMPLANT
BAG DRAIN CYSTO-URO LG1000N (MISCELLANEOUS) ×3 IMPLANT
BAG URINE DRAINAGE (UROLOGICAL SUPPLIES) ×3 IMPLANT
CATH FOLEY 2WAY  5CC 20FR SIL (CATHETERS) ×1
CATH FOLEY 2WAY 5CC 20FR SIL (CATHETERS) ×2 IMPLANT
CNTNR SPEC 2.5X3XGRAD LEK (MISCELLANEOUS)
CONT SPEC 4OZ STER OR WHT (MISCELLANEOUS)
CONTAINER SPEC 2.5X3XGRAD LEK (MISCELLANEOUS) ×2 IMPLANT
GLOVE BIO SURGEON STRL SZ7 (GLOVE) ×4 IMPLANT
GLOVE BIO SURGEON STRL SZ7.5 (GLOVE) ×3 IMPLANT
GOWN STRL REUS W/ TWL LRG LVL3 (GOWN DISPOSABLE) ×2 IMPLANT
GOWN STRL REUS W/ TWL LRG LVL4 (GOWN DISPOSABLE) ×2 IMPLANT
GOWN STRL REUS W/ TWL XL LVL3 (GOWN DISPOSABLE) ×2 IMPLANT
GOWN STRL REUS W/TWL LRG LVL3 (GOWN DISPOSABLE) ×1
GOWN STRL REUS W/TWL LRG LVL4 (GOWN DISPOSABLE) ×1
GOWN STRL REUS W/TWL XL LVL3 (GOWN DISPOSABLE) ×1
IV NS 1000ML (IV SOLUTION) ×1
IV NS 1000ML BAXH (IV SOLUTION) ×2 IMPLANT
IV SET PRIMARY 15D 139IN B9900 (IV SETS) ×3 IMPLANT
KIT TURNOVER CYSTO (KITS) ×3 IMPLANT
LASER GREENLIGHT XPS PROCEDURE (MISCELLANEOUS) ×1 IMPLANT
LASER GRNLGT MOXY FIBER 750UM (MISCELLANEOUS) ×1 IMPLANT
LASER HOLMIUM 1000 FIBER (MISCELLANEOUS) ×1 IMPLANT
PACK CYSTO AR (MISCELLANEOUS) ×3 IMPLANT
SET IRRIG Y TYPE TUR BLADDER L (SET/KITS/TRAYS/PACK) ×3 IMPLANT
SOL .9 NS 3000ML IRR  AL (IV SOLUTION) ×5
SOL .9 NS 3000ML IRR UROMATIC (IV SOLUTION) ×8 IMPLANT
SOL PREP PVP 2OZ (MISCELLANEOUS)
SOLUTION PREP PVP 2OZ (MISCELLANEOUS) ×2 IMPLANT
SURGILUBE 2OZ TUBE FLIPTOP (MISCELLANEOUS) ×3 IMPLANT
SYRINGE IRR TOOMEY STRL 70CC (SYRINGE) ×3 IMPLANT
TUBING CONNECTING 10 (TUBING) ×2 IMPLANT
WATER STERILE IRR 1000ML POUR (IV SOLUTION) ×3 IMPLANT
WATER STERILE IRR 3000ML UROMA (IV SOLUTION) ×3 IMPLANT

## 2017-10-30 NOTE — Transfer of Care (Signed)
Immediate Anesthesia Transfer of Care Note  Patient: BRANSTON HALSTED  Procedure(s) Performed: GREEN LIGHT LASER TURP (TRANSURETHRAL RESECTION OF PROSTATE (N/A Prostate) CYSTOSCOPY WITH LITHOLAPAXY (N/A Bladder) HOLMIUM LASER APPLICATION (N/A Bladder)  Patient Location: PACU  Anesthesia Type:General  Level of Consciousness: drowsy and responds to stimulation  Airway & Oxygen Therapy: Patient Spontanous Breathing and Patient connected to face mask oxygen  Post-op Assessment: Report given to RN and Post -op Vital signs reviewed and stable  Post vital signs: Reviewed and stable  Last Vitals:  Vitals Value Taken Time  BP 117/63 10/30/2017  3:16 PM  Temp    Pulse 58 10/30/2017  3:17 PM  Resp 13 10/30/2017  3:17 PM  SpO2 100 % 10/30/2017  3:17 PM  Vitals shown include unvalidated device data.  Last Pain:  Vitals:   10/30/17 1209  TempSrc: Temporal  PainSc: 0-No pain         Complications: No apparent anesthesia complications

## 2017-10-30 NOTE — Anesthesia Post-op Follow-up Note (Signed)
Anesthesia QCDR form completed.        

## 2017-10-30 NOTE — H&P (Signed)
Date of Initial H&P: 10/24/17  History reviewed, patient examined, no change in status, stable for surgery.

## 2017-10-30 NOTE — Op Note (Signed)
Preoperative diagnosis: 1.  BPH with bladder outlet obstruction                                             2.  Bladder stones  Postoperative diagnosis: Same  Procedure: 1.  Photo vaporization of the prostate with greenlight laser                      2.  Litholapaxy of bladder calculi with holmium laser  Surgeon: Otelia Limes. Yves Dill MD  Anesthesia: General  Indications:See the history and physical. After informed consent the above procedure(s) were requested     Technique and findings: After adequate general anesthesia had been obtained the patient was placed into dorsal lithotomy position and the perineum was prepped and draped in the usual fashion.  Laser scope was coupled to the camera and then advanced into the bladder.  Patient had 2 large bladder calculi present and numerous 2 to 3 mm bladder calculi present.  The largest calculus measured 20 mm in greatest diameter.  Second largest calculus measured 10 mm in greatest diameter.  The 1000 m holmium laser fiber was introduced through the scope and set at a frequency of 10 Hz and a power of 0.6 J.  All stones were fragmented to smaller than 3 mm.  The fragments were then irrigated out of the bladder.  The holmium laser fiber was then exchanged for the greenlight laser fiber the greenlight laser was set at 5 W of power and bladder neck tissue vaporized.  Power was then increased up to 100 W and remaining obstructive tissue from the bladder neck to the verumontanum was vaporized.  Bleeders were controlled with a coagulated to setting.  At this point the laser scope was removed and 10 cc of viscous Xylocaine instilled within the urethra.  A 20 French silicone catheter was placed and irrigated until clear.  A B and O suppository was placed.  The procedure was then terminated and patient transferred to the recovery room in stable condition.

## 2017-10-30 NOTE — Anesthesia Procedure Notes (Signed)
Procedure Name: LMA Insertion Performed by: Leanne Sisler, CRNA Pre-anesthesia Checklist: Patient identified, Patient being monitored, Timeout performed, Emergency Drugs available and Suction available Patient Re-evaluated:Patient Re-evaluated prior to induction Oxygen Delivery Method: Circle system utilized Preoxygenation: Pre-oxygenation with 100% oxygen Induction Type: IV induction Ventilation: Mask ventilation without difficulty LMA: LMA inserted LMA Size: 4.5 Tube type: Oral Number of attempts: 1 Placement Confirmation: positive ETCO2 and breath sounds checked- equal and bilateral Tube secured with: Tape Dental Injury: Teeth and Oropharynx as per pre-operative assessment        

## 2017-10-30 NOTE — Anesthesia Preprocedure Evaluation (Signed)
Anesthesia Evaluation  Patient identified by MRN, date of birth, ID band Patient awake    Reviewed: Allergy & Precautions, NPO status , Patient's Chart, lab work & pertinent test results  History of Anesthesia Complications Negative for: history of anesthetic complications  Airway Mallampati: II  TM Distance: >3 FB Neck ROM: Full    Dental  (+) Missing   Pulmonary neg pulmonary ROS, neg sleep apnea, neg COPD,    breath sounds clear to auscultation- rhonchi (-) wheezing      Cardiovascular Exercise Tolerance: Good (-) hypertension(-) CAD, (-) Past MI, (-) Cardiac Stents and (-) CABG  Rhythm:Regular Rate:Normal - Systolic murmurs and - Diastolic murmurs    Neuro/Psych negative neurological ROS  negative psych ROS   GI/Hepatic negative GI ROS, Neg liver ROS,   Endo/Other  negative endocrine ROSneg diabetes  Renal/GU negative Renal ROS     Musculoskeletal negative musculoskeletal ROS (+)   Abdominal (+) - obese,   Peds  Hematology negative hematology ROS (+)   Anesthesia Other Findings Past Medical History: 03/18/2015: BPH (benign prostatic hyperplasia) No date: Cancer [facility])     Comment:  SKIN CANCER FOREHEAD 03/18/2015: Fatigue No date: History of kidney stones     Comment:  H/O 03/18/2015: Hypercholesteremia 07/15/2014: Personal history of kidney stones 06/13/2017: Tinnitus   Reproductive/Obstetrics                             Anesthesia Physical Anesthesia Plan  ASA: II  Anesthesia Plan: General   Post-op Pain Management:    Induction: Intravenous  PONV Risk Score and Plan: 1 and Ondansetron and Midazolam  Airway Management Planned: LMA  Additional Equipment:   Intra-op Plan:   Post-operative Plan:   Informed Consent: I have reviewed the patients History and Physical, chart, labs and discussed the procedure including the risks, benefits and alternatives for the proposed  anesthesia with the patient or authorized representative who has indicated his/her understanding and acceptance.   Dental advisory given  Plan Discussed with: CRNA and Anesthesiologist  Anesthesia Plan Comments:         Anesthesia Quick Evaluation

## 2017-10-30 NOTE — OR Nursing (Signed)
Anesthesia and Dr Yves Dill notified of platelets.

## 2017-10-30 NOTE — Discharge Instructions (Addendum)
[facility] Laser Prostate Treatment Green light laser therapy is a procedure that uses a high-energy laser to get rid of extra prostate tissue by turning the tissue into a vapor. It is less invasive than traditional methods of prostate surgery, which involve cutting out the prostate tissue. Because the tissue is turned into a vapor (vaporized) rather than cut out, there is generally less blood loss. This surgery is used to treat an enlarged prostate gland (benign prostatic hyperplasia). Tell a health care provider about:  Any allergies you have.  All medicines you are taking, including vitamins, herbs, eye drops, creams, and over-the-counter medicines.  Any problems you or family members have had with anesthetic medicines.  Any blood disorders you have.  Any surgeries you have had.  Any medical conditions you have. What are the risks? Generally, this is a safe procedure. However, problems may occur, including:  Infection.  Bleeding.  Allergic reaction to medicines.  Damage to other structures or organs.  Blood in the urine (hematuria).  Painful urination.  Urinary tract infection.  Erectile dysfunction (rare).  Dry ejaculation.  Scar tissue in the urinary passage.  What happens before the procedure? Staying hydrated Follow instructions from your health care provider about hydration, which may include:  Up to 2 hours before the procedure - you may continue to drink clear liquids, such as water, clear fruit juice, black coffee, and plain tea.  Eating and drinking restrictions Follow instructions from your health care provider about eating and drinking, which may include:  8 hours before the procedure - stop eating heavy meals or foods such as meat, fried foods, or fatty foods.  6 hours before the procedure - stop eating light meals or foods, such as toast or cereal.  6 hours before the procedure - stop drinking milk or drinks that contain milk.  2 hours before the  procedure - stop drinking clear liquids.  Medicines  Ask your health care provider about: ? Changing or stopping your regular medicines. This is especially important if you are taking diabetes medicines or blood thinners. ? Taking medicines such as aspirin and ibuprofen. These medicines can thin your blood. Do not take these medicines before your procedure if your health care provider instructs you not to.  You may be prescribed antibiotic medicine. If so, take your antibiotic as told by your health care provider. Do not stop taking the antibiotic even if you start to feel better. General instructions  Plan to have someone take you home from the hospital or clinic.  If you will be going home right after the procedure, plan to have someone with you for 24 hours. What happens during the procedure?  To reduce your risk of infection: ? Your health care team will wash or sanitize their hands. ? Your skin will be washed with soap.  You will be given one or more of the following: ? A medicine to help you relax (sedative). ? A medicine to make you fall asleep (general anesthetic). ? A medicine that is injected into your spine to numb the area below and slightly above the injection site (spinal anesthetic).  A tube containing viewing scopes and instruments (fiber-optic scope) will be inserted through your penis.  A thin fiber will be put through the tube and positioned next to the extra prostate tissue.  Pulses of laser light will come from the end of the fiber and be projected onto the extra tissue. Your blood will absorb the light, become hot, and vaporize the  extra prostate tissue.  The heat from the laser beam will seal off the blood vessels, which will lessen bleeding.  The fiber-optic scope will be removed and replaced with a temporary tube (catheter) that is used to help urine flow. The procedure may vary among health care providers and hospitals. What happens after the  procedure?  Your blood pressure, heart rate, breathing rate, and blood oxygen level will be monitored until the medicines you were given have worn off.  Depending on factors such as the amount of prostate tissue that was vaporized, the strength of your bladder, and the amount of bleeding expected, your catheter may be removed.  You may be given elastic support stockings to wear to help prevent blood clots in your legs.  Do not drive for 24 hours if you were given a sedative, or for as long as directed by your health care provider. Summary  Green light laser therapy is a procedure that uses a high-energy laser that turns extra prostate tissue into a vapor.  This procedure is less invasive than traditional methods of prostate surgery.  Follow instructions from your health care provider about eating and drinking before the procedure.  Pulses of laser light will come from the end of a thin fiber and be aimed at the extra prostate tissue. Your blood will absorb the light, become hot, and vaporize the extra tissue. This information is not intended to replace advice given to you by your health care provider. Make sure you discuss any questions you have with your health care provider. Document Released: 03/28/2007 Document Revised: 01/08/2016 Document Reviewed: 01/08/2016 Elsevier Interactive Patient Education  2017 [employer]   Benign Prostatic Hyperplasia Benign prostatic hyperplasia (BPH) is an enlarged prostate gland that is caused by the normal aging process and not by cancer. The prostate is a walnut-sized gland that is involved in the production of semen. It is located in front of the rectum and below the bladder. The bladder stores urine and the urethra is the tube that carries the urine out of the body. The prostate may get bigger as a man gets older. An enlarged prostate can press on the urethra. This can make it harder to pass urine. The build-up of urine in the bladder can cause  infection. Back pressure and infection may progress to bladder damage and kidney (renal) failure. What are the causes? This condition is part of a normal aging process. However, not all men develop problems from this condition. If the prostate enlarges away from the urethra, urine flow will not be blocked. If it enlarges toward the urethra and compresses it, there will be problems passing urine. What increases the risk? This condition is more likely to develop in men over the age of 5 years. What are the signs or symptoms? Symptoms of this condition include:  Getting up often during the night to urinate.  Needing to urinate frequently during the day.  Difficulty starting urine flow.  Decrease in size and strength of your urine stream.  Leaking (dribbling) after urinating.  Inability to pass urine. This needs immediate treatment.  Inability to completely empty your bladder.  Pain when you pass urine. This is more common if there is also an infection.  Urinary tract infection (UTI).  How is this diagnosed? This condition is diagnosed based on your medical history, a physical exam, and your symptoms. Tests will also be done, such as:  A post-void bladder scan. This measures any amount of urine that may remain in  your bladder after you finish urinating.  A digital rectal exam. In a rectal exam, your health care provider checks your prostate by putting a lubricated, gloved finger into your rectum to feel the back of your prostate gland. This exam detects the size of your gland and any abnormal lumps or growths.  An exam of your urine (urinalysis).  A prostate specific antigen (PSA) screening. This is a blood test used to screen for prostate cancer.  An ultrasound. This test uses sound waves to electronically produce a picture of your prostate gland.  Your health care provider may refer you to a specialist in kidney and prostate diseases (urologist). How is this treated? Once  symptoms begin, your health care provider will monitor your condition (active surveillance or watchful waiting). Treatment for this condition will depend on the severity of your condition. Treatment may include:  Observation and yearly exams. This may be the only treatment needed if your condition and symptoms are mild.  Medicines to relieve your symptoms, including: ? Medicines to shrink the prostate. ? Medicines to relax the muscle of the prostate.  Surgery in severe cases. Surgery may include: ? Prostatectomy. In this procedure, the prostate tissue is removed completely through an open incision or with a laparascope or robotics. ? Transurethral resection of the prostate (TURP). In this procedure, a tool is inserted through the opening at the tip of the penis (urethra). It is used to cut away tissue of the inner core of the prostate. The pieces are removed through the same opening of the penis. This removes the blockage. ? Transurethral incision (TUIP). In this procedure, small cuts are made in the prostate. This lessens the prostate's pressure on the urethra. ? Transurethral microwave thermotherapy (TUMT). This procedure uses microwaves to create heat. The heat destroys and removes a small amount of prostate tissue. ? Transurethral needle ablation (TUNA). This procedure uses radio frequencies to destroy and remove a small amount of prostate tissue. ? Interstitial laser coagulation ([place]). This procedure uses a laser to destroy and remove a small amount of prostate tissue. ? Transurethral electrovaporization (TUVP). This procedure uses electrodes to destroy and remove a small amount of prostate tissue. ? Prostatic urethral lift. This procedure inserts an implant to push the lobes of the prostate away from the urethra.  Follow these instructions at home:  Take over-the-counter and prescription medicines only as told by your health care provider.  Monitor your symptoms for any changes. Contact  your health care provider with any changes.  Avoid drinking large amounts of liquid before going to bed or out in public.  Avoid or reduce how much caffeine or alcohol you drink.  Give yourself time when you urinate.  Keep all follow-up visits as told by your health care provider. This is important. Contact a health care provider if:  You have unexplained back pain.  Your symptoms do not get better with treatment.  You develop side effects from the medicine you are taking.  Your urine becomes very dark or has a bad smell.  Your lower abdomen becomes distended and you have trouble passing your urine. Get help right away if:  You have a fever or chills.  You suddenly cannot urinate.  You feel lightheaded, or very dizzy, or you faint.  There are large amounts of blood or clots in the urine.  Your urinary problems become hard to manage.  You develop moderate to severe low back or flank pain. The flank is the side of your body  between the ribs and the hip. These symptoms may represent a serious problem that is an emergency. Do not wait to see if the symptoms will go away. Get medical help right away. Call your local emergency services (911 in the [place] Do not drive yourself to the hospital. Summary  Benign prostatic hyperplasia (BPH) is an enlarged prostate that is caused by the normal aging process and not by cancer.  An enlarged prostate can press on the urethra. This can make it hard to pass urine.  This condition is part of a normal aging process and is more likely to develop in men over the age of 64 years.  Get help right away if you suddenly cannot urinate. This information is not intended to replace advice given to you by your health care provider. Make sure you discuss any questions you have with your health care provider. Document Released: 12/19/2004 Document Revised: 01/24/2016 Document Reviewed: 01/24/2016 Elsevier Interactive Patient Education  2018 [place] A bladder stone is a buildup of crystals made from the proteins and minerals found in urine. These substances build up when your urine becomes too concentrated. Bladder stones usually develop when you have another medical condition that prevents your bladder from emptying completely. Crystals can form in the small amount of urine left in your bladder. Bladder stones that grow large can become painful and block the flow of urine. What are the causes? Bladder stones can be caused by:  An enlarged prostate, which prevents the bladder from emptying well.  A urinary tract infection (UTI).  A weak spot in the bladder that creates a small pouch (bladder diverticulum).  Nerve damage that may interfere with the messages from your brain to your bladder muscles (neurogenic bladder). This can result from conditions such as Parkinson disease or spinal cord injuries.  What increases the risk? This condition is more likely to develop in people who:  Get frequent UTIs.  Have another medical condition that affects their bladder.  Have a history of bladder surgery.  Have a spinal cord injury.  Have an abnormally shaped bladder (deformity).  What are the signs or symptoms? Small bladder stones do not always cause symptoms. Larger stones can cause symptoms that include:  Abdominal pain.  A frequent need to urinate.  Difficulty urinating.  Painful urination.  Blood in the urine.  Cloudy or dark colored urine.  Pain in the penis or testicles for men.  How is this diagnosed? This condition is diagnosed based on your symptoms, medical history, and a physical exam. The exam will include checking for abdominal tenderness. For men, a rectal exam may be done to check the prostate gland. You may also have other tests, such as:  A urine test (urinalysis) to find out more about your condition.  A urine sample test to check for other infections (culture).  Blood tests,  including tests to look for a substance called creatinine. A creatinine level that is higher than normal could indicate a blockage.  A procedure to examine the inside of your bladder using a thin scope with a tiny lighted camera (cystoscopy) inserted through the urethra.  You may also have imaging studies such as:  A CT scan of your abdomen and pelvis to look for a stone and check whether it is blocking the flow of urine.  An X-ray of your kidneys, ureters, bladder, and urethra after you have a type of dye (contrast material) injected into your veins (intravenous pyelogram or IVP).  An abdominal and pelvic ultrasound to locate bladder stones and identify areas where urine flow is blocked.  How is this treated? Small bladder stones do not require treatment. They can pass out of your body on their own. You may be instructed to drink extra water to help the stone pass through the bladder. Larger stones may need to be removed with one of the following procedures:  Cystolitholapaxy. A cystoscope is inserted through the urethra and into the bladder to view the stone. A laser, ultrasound, or other device is used to break the stone into smaller pieces. Fluids are used to flush the small pieces from the area.  Surgical removal. You may need surgery to remove the stone if it is large and causing pain. A small incision is made in the bladder to directly remove the stone.  If the stone blocks the flow of urine, you may have a thin, flexible tube (stent) threaded into your ureter. The stent may be left in place after removal of a stone to ensure flow of urine until healing is complete.  Follow these instructions at home:  Drink enough fluid to keep your urine clear or pale yellow.  Report unusual urinary symptoms to your health care provider. Early diagnosis of an enlarged prostate and other bladder conditions may reduce your chance of getting bladder stones.  Avoid smoking and illegal drug  use. Contact a health care provider if:  You have a fever.  You feel nauseous or vomit.  You are unable to urinate.  You have a large amount of blood in your urine. Get help right away if:  You have severe back pain or lower abdominal pain.  You are vomiting and cannot keep down any medicines or water. This information is not intended to replace advice given to you by your health care provider. Make sure you discuss any questions you have with your health care provider. Document Released: 01/03/2015 Document Revised: 05/28/2015 Document Reviewed: 01/03/2015 Elsevier Interactive Patient Education  2018 [place]   1) The drugs that you were given will stay in your system until tomorrow so for the next 24 hours you should not:  A) Drive an automobile B) Make any legal decisions C) Drink any alcoholic beverage   2) You may resume regular meals tomorrow.  Today it is better to start with liquids and gradually work up to solid foods.  You may eat anything you prefer, but it is better to start with liquids, then soup and crackers, and gradually work up to solid foods.   3) Please notify your doctor immediately if you have any unusual bleeding, trouble breathing, redness and pain at the surgery site, drainage, fever, or pain not relieved by medication.    4) Additional Instructions:        Please contact your physician with any problems or Same Day Surgery at [phone], Monday through Friday 6 am to 4 pm, or [place] at [facility] number at [phone].

## 2017-10-31 ENCOUNTER — Encounter: Payer: Self-pay | Admitting: Urology

## 2017-10-31 DIAGNOSIS — N201 Calculus of ureter: Secondary | ICD-10-CM | POA: Diagnosis not present

## 2017-10-31 DIAGNOSIS — N200 Calculus of kidney: Secondary | ICD-10-CM | POA: Diagnosis not present

## 2017-10-31 NOTE — Anesthesia Postprocedure Evaluation (Signed)
Anesthesia Post Note  Patient: DEMPSY DAMIANO  Procedure(s) Performed: GREEN LIGHT LASER TURP (TRANSURETHRAL RESECTION OF PROSTATE (N/A Prostate) CYSTOSCOPY WITH LITHOLAPAXY (N/A Bladder) HOLMIUM LASER APPLICATION (N/A Bladder)  Patient location during evaluation: PACU Anesthesia Type: General Level of consciousness: awake and alert and oriented Pain management: pain level controlled Vital Signs Assessment: post-procedure vital signs reviewed and stable Respiratory status: spontaneous breathing Cardiovascular status: blood pressure returned to baseline Anesthetic complications: no     Last Vitals:  Vitals:   10/30/17 1617 10/30/17 1638  BP: 120/89 (!) 110/57  Pulse: 63 62  Resp: 17 17  Temp: (!) 36.2 C (!) 36.1 C  SpO2: 100% 100%    Last Pain:  Vitals:   10/30/17 1638  TempSrc: Temporal  PainSc: 0-No pain                 Chinmay Squier

## 2017-11-16 DIAGNOSIS — N401 Enlarged prostate with lower urinary tract symptoms: Secondary | ICD-10-CM | POA: Diagnosis not present

## 2017-11-16 DIAGNOSIS — N210 Calculus in bladder: Secondary | ICD-10-CM | POA: Diagnosis not present

## 2017-12-18 DIAGNOSIS — N401 Enlarged prostate with lower urinary tract symptoms: Secondary | ICD-10-CM | POA: Diagnosis not present

## 2017-12-24 DIAGNOSIS — H2513 Age-related nuclear cataract, bilateral: Secondary | ICD-10-CM | POA: Diagnosis not present

## 2018-01-04 DIAGNOSIS — H53419 Scotoma involving central area, unspecified eye: Secondary | ICD-10-CM | POA: Diagnosis not present

## 2018-01-16 DIAGNOSIS — R3914 Feeling of incomplete bladder emptying: Secondary | ICD-10-CM | POA: Diagnosis not present

## 2018-01-16 DIAGNOSIS — N401 Enlarged prostate with lower urinary tract symptoms: Secondary | ICD-10-CM | POA: Diagnosis not present

## 2018-03-01 ENCOUNTER — Encounter: Payer: Self-pay | Admitting: Family Medicine

## 2018-03-01 ENCOUNTER — Ambulatory Visit (INDEPENDENT_AMBULATORY_CARE_PROVIDER_SITE_OTHER): Payer: Medicare Other | Admitting: Family Medicine

## 2018-03-01 ENCOUNTER — Other Ambulatory Visit: Payer: Self-pay

## 2018-03-01 VITALS — BP 107/68 | HR 70 | Temp 98.7°F | Ht 71.0 in | Wt 200.0 lb

## 2018-03-01 DIAGNOSIS — J069 Acute upper respiratory infection, unspecified: Secondary | ICD-10-CM | POA: Diagnosis not present

## 2018-03-01 MED ORDER — PREDNISONE 50 MG PO TABS: 50.0000 mg | ORAL_TABLET | Freq: Every day | ORAL | 0 refills | Status: DC

## 2018-03-01 MED ORDER — AZITHROMYCIN 250 MG PO TABS: ORAL_TABLET | ORAL | 0 refills | Status: DC

## 2018-03-01 MED ORDER — BENZONATATE 200 MG PO CAPS: 200.0000 mg | ORAL_CAPSULE | Freq: Two times a day (BID) | ORAL | 0 refills | Status: DC | PRN

## 2018-03-01 NOTE — Progress Notes (Signed)
BP 107/68   Pulse 70   Temp 98.7 F (37.1 C) (Oral)   Ht 5\' 11"  (1.803 m)   Wt 200 lb (90.7 kg)   SpO2 96%   BMI 27.89 kg/m    Subjective:    Patient ID: Darius Williams, male    DOB: 12/10/1945, 73 y.o.   MRN: [id number]  HPI: Darius Williams is a 73 y.o. male  Chief Complaint  Patient presents with  . Cough    chest congestion since x about a week. tried OTC mucinex  . Nasal Congestion   UPPER RESPIRATORY TRACT INFECTION Duration: 1 week Worst symptom: congestion, larngyitis Fever: yes Cough: yes Shortness of breath: no Wheezing: no Chest pain: no Chest tightness: yes Chest congestion: yes Nasal congestion: yes Runny nose: yes Post nasal drip: yes Sneezing: yes Sore throat: no Swollen glands: no Sinus pressure: no Headache: no Face pain: no Toothache: no Ear pain: no  Ear pressure: no  Eyes red/itching:no Eye drainage/crusting: no  Vomiting: no Rash: no Fatigue: yes Sick contacts: yes Strep contacts: no  Context: worse Recurrent sinusitis: no Relief with OTC cold/cough medications: no  Treatments attempted: mucinex    Relevant past medical, surgical, family and social history reviewed and updated as indicated. Interim medical history since our last visit reviewed. Allergies and medications reviewed and updated.  Review of Systems  Constitutional: Negative.   HENT: Positive for congestion and postnasal drip. Negative for dental problem, drooling, ear discharge, ear pain, facial swelling, hearing loss, mouth sores, nosebleeds, rhinorrhea, sinus pressure, sinus pain, sneezing, sore throat, tinnitus, trouble swallowing and voice change.   Respiratory: Positive for cough. Negative for apnea, choking, chest tightness, shortness of breath, wheezing and stridor.   Cardiovascular: Negative.   Psychiatric/Behavioral: Negative.     Per HPI unless specifically indicated above     Objective:    BP 107/68   Pulse 70   Temp 98.7 F (37.1 C) (Oral)   Ht  5\' 11"  (1.803 m)   Wt 200 lb (90.7 kg)   SpO2 96%   BMI 27.89 kg/m   Wt Readings from Last 3 Encounters:  03/01/18 200 lb (90.7 kg)  10/30/17 194 lb (88 kg)  07/31/17 196 lb (88.9 kg)    Physical Exam Vitals signs and nursing note reviewed.  Constitutional:      General: He is not in acute distress.    Appearance: Normal appearance. He is not ill-appearing, toxic-appearing or diaphoretic.  HENT:     Head: Normocephalic and atraumatic.     Right Ear: Tympanic membrane, ear canal and external ear normal. There is no impacted cerumen.     Left Ear: Tympanic membrane, ear canal and external ear normal. There is no impacted cerumen.     Nose: Congestion and rhinorrhea present.     Mouth/Throat:     Mouth: Mucous membranes are moist.     Pharynx: Oropharynx is clear. No oropharyngeal exudate or posterior oropharyngeal erythema.  Eyes:     General: No scleral icterus.       Right eye: No discharge.        Left eye: No discharge.     Extraocular Movements: Extraocular movements intact.     Conjunctiva/sclera: Conjunctivae normal.     Pupils: Pupils are equal, round, and reactive to light.  Neck:     Musculoskeletal: Normal range of motion and neck supple.  Cardiovascular:     Rate and Rhythm: Normal rate and regular rhythm.  Pulses: Normal pulses.     Heart sounds: Normal heart sounds. No murmur. No friction rub. No gallop.   Pulmonary:     Effort: Pulmonary effort is normal. No respiratory distress.     Breath sounds: No stridor. Wheezing present. No rhonchi or rales.  Chest:     Chest wall: No tenderness.  Musculoskeletal: Normal range of motion.  Skin:    General: Skin is warm and dry.     Capillary Refill: Capillary refill takes less than 2 seconds.     Coloration: Skin is not jaundiced or pale.     Findings: No bruising, erythema, lesion or rash.  Neurological:     General: No focal deficit present.     Mental Status: He is alert and oriented to person, place, and  time. Mental status is at baseline.  Psychiatric:        Mood and Affect: Mood normal.        Behavior: Behavior normal.        Thought Content: Thought content normal.        Judgment: Judgment normal.     Results for orders placed or performed during the hospital encounter of 10/30/17  Platelet count  Result Value Ref Range   Platelets 141 (L) 150 - 400 K/uL      Assessment & Plan:   Problem List Items Addressed This Visit    None    Visit Diagnoses    Upper respiratory tract infection, unspecified type    -  Primary   Will treat with prednisone and tessalon perles, azithromycin for inflammation. Call with any concerns.    Relevant Medications   azithromycin (ZITHROMAX) 250 MG tablet       Follow up plan: Return if symptoms worsen or fail to improve.

## 2018-03-06 ENCOUNTER — Telehealth: Payer: Self-pay | Admitting: Family Medicine

## 2018-03-06 NOTE — Telephone Encounter (Signed)
Copied from [place] [phone]. Topic: Quick Communication - Rx Refill/Question >> Mar 06, 2018 10:29 AM Alanda Slim E wrote: Medication: azithromycin (ZITHROMAX) 250 MG tablet - Pt is better but not well and would like a refill   Has the patient contacted their pharmacy?   Preferred Pharmacy (with phone number or street name): [place], [place] - [place] [phone] (Phone) [phone] (Fax)    Agent: Please be advised that RX refills may take up to 3 business days. We ask that you follow-up with your pharmacy.

## 2018-03-06 NOTE — Telephone Encounter (Signed)
TC to patient. Reporting he feels better but continues to cough, feeling not completely well-asking if he needs a refill on the zithromax.  Patient denies fever/chills but sweats at hs at times. His cough has improved but he is not cough up much phlegm any longer. No other symptoms reported. He will start taking mucinex again with lots of water. He will take cough medication only as needed at hs. Discussed lasting effects of zithromax remaining in his system for 5 days after completing the medication.  Reviewed precautions to take to avoid further illness. He will call back if needed.

## 2018-03-08 ENCOUNTER — Telehealth: Payer: Self-pay | Admitting: Family Medicine

## 2018-03-08 NOTE — Telephone Encounter (Signed)
Called to try to reschedule the AWV originally 03/28/2018 at 4:00 PM, due to the [facility] schedule adjustment.   Patient declined doing the Medicare Annual Wellness Visit because he states the last AWV was coded wrong and they spoke with several people to try and get it fixed and couldn't.  Doesn't want to do this visit because he was charged for the last one.  Janace Hoard, Care Guide.

## 2018-03-08 NOTE — Telephone Encounter (Signed)
Pt wife is calling and he is no better and would like refill on  zithromax . [facility] in [place]

## 2018-03-11 NOTE — Telephone Encounter (Signed)
Call pt 

## 2018-03-11 NOTE — Telephone Encounter (Signed)
Left message on machine for pt to return call to the office.  

## 2018-03-12 NOTE — Telephone Encounter (Signed)
Phone call Discussed with patient cold is improving will not use more antibiotics and observe.

## 2018-03-12 NOTE — Telephone Encounter (Signed)
Phone call

## 2018-03-28 ENCOUNTER — Ambulatory Visit: Payer: Self-pay

## 2018-05-31 ENCOUNTER — Telehealth: Payer: Self-pay | Admitting: Family Medicine

## 2018-05-31 NOTE — Telephone Encounter (Signed)
Pt declined AWV. °

## 2018-06-17 DIAGNOSIS — R05 Cough: Secondary | ICD-10-CM | POA: Diagnosis not present

## 2018-06-17 DIAGNOSIS — E669 Obesity, unspecified: Secondary | ICD-10-CM | POA: Diagnosis not present

## 2018-06-17 DIAGNOSIS — I208 Other forms of angina pectoris: Secondary | ICD-10-CM | POA: Diagnosis not present

## 2018-06-17 DIAGNOSIS — J329 Chronic sinusitis, unspecified: Secondary | ICD-10-CM | POA: Diagnosis not present

## 2018-06-17 DIAGNOSIS — M174 Other bilateral secondary osteoarthritis of knee: Secondary | ICD-10-CM | POA: Diagnosis not present

## 2018-06-17 DIAGNOSIS — J309 Allergic rhinitis, unspecified: Secondary | ICD-10-CM | POA: Diagnosis not present

## 2018-06-17 DIAGNOSIS — R0602 Shortness of breath: Secondary | ICD-10-CM | POA: Diagnosis not present

## 2018-06-17 DIAGNOSIS — I77810 Thoracic aortic ectasia: Secondary | ICD-10-CM | POA: Diagnosis not present

## 2018-06-19 ENCOUNTER — Ambulatory Visit: Payer: Medicare Other | Admitting: Family Medicine

## 2018-06-19 DIAGNOSIS — Z1283 Encounter for screening for malignant neoplasm of skin: Secondary | ICD-10-CM | POA: Diagnosis not present

## 2018-06-19 DIAGNOSIS — L720 Epidermal cyst: Secondary | ICD-10-CM | POA: Diagnosis not present

## 2018-06-19 DIAGNOSIS — L570 Actinic keratosis: Secondary | ICD-10-CM | POA: Diagnosis not present

## 2018-06-19 DIAGNOSIS — D2230 Melanocytic nevi of unspecified part of face: Secondary | ICD-10-CM | POA: Diagnosis not present

## 2018-06-19 DIAGNOSIS — D229 Melanocytic nevi, unspecified: Secondary | ICD-10-CM | POA: Diagnosis not present

## 2018-06-19 DIAGNOSIS — D1800 Hemangioma unspecified site: Secondary | ICD-10-CM | POA: Diagnosis not present

## 2018-06-19 DIAGNOSIS — D225 Melanocytic nevi of trunk: Secondary | ICD-10-CM | POA: Diagnosis not present

## 2018-06-19 DIAGNOSIS — Z85828 Personal history of other malignant neoplasm of skin: Secondary | ICD-10-CM | POA: Diagnosis not present

## 2018-06-19 DIAGNOSIS — L821 Other seborrheic keratosis: Secondary | ICD-10-CM | POA: Diagnosis not present

## 2018-06-19 DIAGNOSIS — L812 Freckles: Secondary | ICD-10-CM | POA: Diagnosis not present

## 2018-06-19 DIAGNOSIS — L578 Other skin changes due to chronic exposure to nonionizing radiation: Secondary | ICD-10-CM | POA: Diagnosis not present

## 2018-06-25 ENCOUNTER — Ambulatory Visit: Payer: Medicare Other | Admitting: Family Medicine

## 2018-07-19 ENCOUNTER — Encounter: Payer: Self-pay | Admitting: Family Medicine

## 2018-07-19 ENCOUNTER — Other Ambulatory Visit: Payer: Self-pay

## 2018-07-19 ENCOUNTER — Ambulatory Visit (INDEPENDENT_AMBULATORY_CARE_PROVIDER_SITE_OTHER): Payer: Medicare Other | Admitting: Family Medicine

## 2018-07-19 VITALS — BP 105/69 | HR 62 | Temp 98.0°F | Ht 71.0 in | Wt 193.0 lb

## 2018-07-19 DIAGNOSIS — N400 Enlarged prostate without lower urinary tract symptoms: Secondary | ICD-10-CM

## 2018-07-19 DIAGNOSIS — S81801A Unspecified open wound, right lower leg, initial encounter: Secondary | ICD-10-CM | POA: Diagnosis not present

## 2018-07-19 DIAGNOSIS — R5383 Other fatigue: Secondary | ICD-10-CM | POA: Diagnosis not present

## 2018-07-19 DIAGNOSIS — E7800 Pure hypercholesterolemia, unspecified: Secondary | ICD-10-CM

## 2018-07-19 DIAGNOSIS — Z23 Encounter for immunization: Secondary | ICD-10-CM

## 2018-07-19 DIAGNOSIS — Z0000 Encounter for general adult medical examination without abnormal findings: Secondary | ICD-10-CM | POA: Diagnosis not present

## 2018-07-19 LAB — UA/M W/RFLX CULTURE, ROUTINE
Bilirubin, UA: NEGATIVE
Glucose, UA: NEGATIVE
Ketones, UA: NEGATIVE
Leukocytes,UA: NEGATIVE
Nitrite, UA: NEGATIVE
Protein,UA: NEGATIVE
Specific Gravity, UA: 1.025 (ref 1.005–1.030)
Urobilinogen, Ur: 0.2 mg/dL (ref 0.2–1.0)
pH, UA: 5.5 (ref 5.0–7.5)

## 2018-07-19 LAB — MICROSCOPIC EXAMINATION
Bacteria, UA: NONE SEEN
Epithelial Cells (non renal): NONE SEEN /hpf (ref 0–10)
WBC, UA: NONE SEEN /hpf (ref 0–5)

## 2018-07-19 NOTE — Patient Instructions (Addendum)
Preventative Services:  Health Risk Assessment and Personalized Prevention Plan: Done today Bone Mass Measurements: N/A CVD Screening: Done today Colon Cancer Screening: Up to date Depression Screening: Done today Diabetes Screening: Done today Glaucoma Screening: See your eye doctor Hepatitis B vaccine: N/A Hepatitis C screening: up to date  HIV Screening: Up to date Flu Vaccine: Get in the fall Lung cancer Screening: N/A Obesity Screening: Done today Pneumonia Vaccines (2): up to date STI Screening: N/A PSA screening: Done today   Health Maintenance After Age 47 After age 71, you are at a higher risk for certain long-term diseases and infections as well as injuries from falls. Falls are a major cause of broken bones and head injuries in people who are older than age 61. Getting regular preventive care can help to keep you healthy and well. Preventive care includes getting regular testing and making lifestyle changes as recommended by your health care provider. Talk with your health care provider about:  Which screenings and tests you should have. A screening is a test that checks for a disease when you have no symptoms.  A diet and exercise plan that is right for you. What should I know about screenings and tests to prevent falls? Screening and testing are the best ways to find a health problem early. Early diagnosis and treatment give you the best chance of managing medical conditions that are common after age 68. Certain conditions and lifestyle choices may make you more likely to have a fall. Your health care provider may recommend:  Regular vision checks. Poor vision and conditions such as cataracts can make you more likely to have a fall. If you wear glasses, make sure to get your prescription updated if your vision changes.  Medicine review. Work with your health care provider to regularly review all of the medicines you are taking, including over-the-counter medicines. Ask your  health care provider about any side effects that may make you more likely to have a fall. Tell your health care provider if any medicines that you take make you feel dizzy or sleepy.  Osteoporosis screening. Osteoporosis is a condition that causes the bones to get weaker. This can make the bones weak and cause them to break more easily.  Blood pressure screening. Blood pressure changes and medicines to control blood pressure can make you feel dizzy.  Strength and balance checks. Your health care provider may recommend certain tests to check your strength and balance while standing, walking, or changing positions.  Foot health exam. Foot pain and numbness, as well as not wearing proper footwear, can make you more likely to have a fall.  Depression screening. You may be more likely to have a fall if you have a fear of falling, feel emotionally low, or feel unable to do activities that you used to do.  Alcohol use screening. Using too much alcohol can affect your balance and may make you more likely to have a fall. What actions can I take to lower my risk of falls? General instructions  Talk with your health care provider about your risks for falling. Tell your health care provider if: ? You fall. Be sure to tell your health care provider about all falls, even ones that seem minor. ? You feel dizzy, sleepy, or off-balance.  Take over-the-counter and prescription medicines only as told by your health care provider. These include any supplements.  Eat a healthy diet and maintain a healthy weight. A healthy diet includes low-fat dairy products, low-fat (lean)  meats, and fiber from whole grains, beans, and lots of fruits and vegetables. Home safety  Remove any tripping hazards, such as rugs, cords, and clutter.  Install safety equipment such as grab bars in bathrooms and safety rails on stairs.  Keep rooms and walkways well-lit. Activity   Follow a regular exercise program to stay fit. This  will help you maintain your balance. Ask your health care provider what types of exercise are appropriate for you.  If you need a cane or walker, use it as recommended by your health care provider.  Wear supportive shoes that have nonskid soles. Lifestyle  Do not drink alcohol if your health care provider tells you not to drink.  If you drink alcohol, limit how much you have: ? 0-1 drink a day for women. ? 0-2 drinks a day for men.  Be aware of how much alcohol is in your drink. In the [place] one drink equals one typical bottle of beer (12 oz), one-half glass of wine (5 oz), or one shot of hard liquor (1 oz).  Do not use any products that contain nicotine or tobacco, such as cigarettes and e-cigarettes. If you need help quitting, ask your health care provider. Summary  Having a healthy lifestyle and getting preventive care can help to protect your health and wellness after age 41.  Screening and testing are the best way to find a health problem early and help you avoid having a fall. Early diagnosis and treatment give you the best chance for managing medical conditions that are more common for people who are older than age 19.  Falls are a major cause of broken bones and head injuries in people who are older than age 78. Take precautions to prevent a fall at home.  Work with your health care provider to learn what changes you can make to improve your health and wellness and to prevent falls. This information is not intended to replace advice given to you by your health care provider. Make sure you discuss any questions you have with your health care provider. Document Released: 11/01/2016 Document Revised: 04/11/2018 Document Reviewed: 11/01/2016 Elsevier Patient Education  [place].  Erectile Dysfunction Erectile dysfunction (ED) is the inability to get or keep an erection in order to have sexual intercourse. Erectile dysfunction may include:  Inability to get an  erection.  Lack of enough hardness of the erection to allow penetration.  Loss of the erection before sex is finished. What are the causes? This condition may be caused by:  Certain medicines, such as: ? Pain relievers. ? Antihistamines. ? Antidepressants. ? Blood pressure medicines. ? Water pills (diuretics). ? Ulcer medicines. ? Muscle relaxants. ? Drugs.  Excessive drinking.  Psychological causes, such as: ? Anxiety. ? Depression. ? Sadness. ? Exhaustion. ? Performance fear. ? Stress.  Physical causes, such as: ? Artery problems. This may include diabetes, smoking, liver disease, or atherosclerosis. ? High blood pressure. ? Hormonal problems, such as low testosterone. ? Obesity. ? Nerve problems. This may include back or pelvic injuries, diabetes mellitus, multiple sclerosis, or Parkinson disease. What are the signs or symptoms? Symptoms of this condition include:  Inability to get an erection.  Lack of enough hardness of the erection to allow penetration.  Loss of the erection before sex is finished.  Normal erections at some times, but with frequent unsatisfactory episodes.  Low sexual satisfaction in either partner due to erection problems.  A curved penis occurring with erection. The curve may cause  pain or the penis may be too curved to allow for intercourse.  Never having nighttime erections. How is this diagnosed? This condition is often diagnosed by:  Performing a physical exam to find other diseases or specific problems with the penis.  Asking you detailed questions about the problem.  Performing blood tests to check for diabetes mellitus or to measure hormone levels.  Performing other tests to check for underlying health conditions.  Performing an ultrasound exam to check for scarring.  Performing a test to check blood flow to the penis.  Doing a sleep study at home to measure nighttime erections. How is this treated? This condition may  be treated by:  Medicine taken by mouth to help you achieve an erection (oral medicine).  Hormone replacement therapy to replace low testosterone levels.  Medicine that is injected into the penis. Your health care provider may instruct you how to give yourself these injections at home.  Vacuum pump. This is a pump with a ring on it. The pump and ring are placed on the penis and used to create pressure that helps the penis become erect.  Penile implant surgery. In this procedure, you may receive: ? An inflatable implant. This consists of cylinders, a pump, and a reservoir. The cylinders can be inflated with a fluid that helps to create an erection, and they can be deflated after intercourse. ? A semi-rigid implant. This consists of two silicone rubber rods. The rods provide some rigidity. They are also flexible, so the penis can both curve downward in its normal position and become straight for sexual intercourse.  Blood vessel surgery, to improve blood flow to the penis. During this procedure, a blood vessel from a different part of the body is placed into the penis to allow blood to flow around (bypass) damaged or blocked blood vessels.  Lifestyle changes, such as exercising more, losing weight, and quitting smoking. Follow these instructions at home: Medicines   Take over-the-counter and prescription medicines only as told by your health care provider. Do not increase the dosage without first discussing it with your health care provider.  If you are using self-injections, perform injections as directed by your health care provider. Make sure to avoid any veins that are on the surface of the penis. After giving an injection, apply pressure to the injection site for 5 minutes. General instructions  Exercise regularly, as directed by your health care provider. Work with your health care provider to lose weight, if needed.  Do not use any products that contain nicotine or tobacco, such as  cigarettes and e-cigarettes. If you need help quitting, ask your health care provider.  Before using a vacuum pump, read the instructions that come with the pump and discuss any questions with your health care provider.  Keep all follow-up visits as told by your health care provider. This is important. Contact a health care provider if:  You feel nauseous.  You vomit. Get help right away if:  You are taking oral or injectable medicines and you have an erection that lasts longer than 4 hours. If your health care provider is unavailable, go to the nearest emergency room for evaluation. An erection that lasts much longer than 4 hours can result in permanent damage to your penis.  You have severe pain in your groin or abdomen.  You develop redness or severe swelling of your penis.  You have redness spreading up into your groin or lower abdomen.  You are unable to urinate.  You experience chest pain or a rapid heart beat (palpitations) after taking oral medicines. Summary  Erectile dysfunction (ED) is the inability to get or keep an erection during sexual intercourse. This problem can usually be treated successfully.  This condition is diagnosed based on a physical exam, your symptoms, and tests to determine the cause. Treatment varies depending on the cause, and may include medicines, hormone therapy, surgery, or vacuum pump.  You may need follow-up visits to make sure that you are using your medicines or devices correctly.  Get help right away if you are taking or injecting medicines and you have an erection that lasts longer than 4 hours. This information is not intended to replace advice given to you by your health care provider. Make sure you discuss any questions you have with your health care provider. Document Released: 12/17/1999 Document Revised: 12/01/2016 Document Reviewed: 01/05/2016 Elsevier Patient Education  [place] (Tetanus and Diphtheria): What  You Need to Know 1. Why get vaccinated? Tetanus  and diphtheria are very serious diseases. They are rare in the [place] today, but people who do become infected often have severe complications. Td vaccine is used to protect adolescents and adults from both of these diseases. Both tetanus and diphtheria are infections caused by bacteria. Diphtheria spreads from person to person through coughing or sneezing. Tetanus-causing bacteria enter the body through cuts, scratches, or wounds. TETANUS (Lockjaw) causes painful muscle tightening and stiffness, usually all over the body.  It can lead to tightening of muscles in the head and neck so you can't open your mouth, swallow, or sometimes even breathe. Tetanus kills about 1 out of every 10 people who are infected even after receiving the best medical care. DIPHTHERIA can cause a thick coating to form in the back of the throat.  It can lead to breathing problems, paralysis, heart failure, and death. Before vaccines, as many as 200,000 cases of diphtheria and hundreds of cases of tetanus were reported in the [place] each year. Since vaccination began, reports of cases for both diseases have dropped by about 99%. 2. Td vaccine Td vaccine can protect adolescents and adults from tetanus and diphtheria. Td is usually given as a booster dose every 10 years but it can also be given earlier after a severe and dirty wound or burn. Another vaccine, called Tdap, which protects against pertussis in addition to tetanus and diphtheria, is sometimes recommended instead of Td vaccine. Your doctor or the person giving you the vaccine can give you more information. Td may safely be given at the same time as other vaccines. 3. Some people should not get this vaccine  A person who has ever had a life-threatening allergic reaction after a previous dose of any tetanus or diphtheria containing vaccine, OR has a severe allergy to any part of this vaccine, should not  get Td vaccine. Tell the person giving the vaccine about any severe allergies.  Talk to your doctor if you: ? had severe pain or swelling after any vaccine containing diphtheria or tetanus, ? ever had a condition called Guillain Barr Syndrome (GBS), ? aren't feeling well on the day the shot is scheduled. 4. Risks of a vaccine reaction With any medicine, including vaccines, there is a chance of side effects. These are usually mild and go away on their own. Serious reactions are also possible but are rare. Most people who get Td vaccine do not have any problems with it. Mild Problems following Td vaccine: (  Did not interfere with activities)  Pain where the shot was given (about 8 people in 10)  Redness or swelling where the shot was given (about 1 person in 4)  Mild fever (rare)  Headache (about 1 person in 4)  Tiredness (about 1 person in 4) Moderate Problems following Td vaccine: (Interfered with activities, but did not require medical attention)  Fever over 102F (rare) Severe Problems following Td vaccine: (Unable to perform usual activities; required medical attention)  Swelling, severe pain, bleeding and/or redness in the arm where the shot was given (rare). Problems that could happen after any vaccine:  People sometimes faint after a medical procedure, including vaccination. Sitting or lying down for about 15 minutes can help prevent fainting, and injuries caused by a fall. Tell your doctor if you feel dizzy, or have vision changes or ringing in the ears.  Some people get severe pain in the shoulder and have difficulty moving the arm where a shot was given. This happens very rarely.  Any medication can cause a severe allergic reaction. Such reactions from a vaccine are very rare, estimated at fewer than 1 in a million doses, and would happen within a few minutes to a few hours after the vaccination. As with any medicine, there is a very remote chance of a vaccine causing a  serious injury or death. The safety of vaccines is always being monitored. For more information, visit: http://www.aguilar.org/ 5. What if there is a serious reaction? What should I look for?  Look for anything that concerns you, such as signs of a severe allergic reaction, very high fever, or unusual behavior. Signs of a severe allergic reaction can include hives, swelling of the face and throat, difficulty breathing, a fast heartbeat, dizziness, and weakness. These would usually start a few minutes to a few hours after the vaccination. What should I do?  If you think it is a severe allergic reaction or other emergency that can't wait, call 9-1-1 or get the person to the nearest hospital. Otherwise, call your doctor.  Afterward, the reaction should be reported to the Vaccine Adverse Event Reporting System (VAERS). Your doctor might file this report, or you can do it yourself through the VAERS web site at www.vaers.SamedayNews.es, or by calling 351-633-2353. VAERS does not give medical advice. 6. The National Vaccine Injury Compensation Program The [employer] Vaccine Injury Compensation Program (VICP) is a federal program that was created to compensate people who may have been injured by certain vaccines. Persons who believe they may have been injured by a vaccine can learn about the program and about filing a claim by calling 661-465-8030 or visiting the [place] website at GoldCloset.com.ee. There is a time limit to file a claim for compensation. 7. How can I learn more?  Ask your doctor. He or she can give you the vaccine package insert or suggest other sources of information.  Call your local or state health department.  Contact the Centers for Disease Control and Prevention (CDC): ? Call 561-275-3982 (1-800-CDC-INFO) ? Visit CDC's website at http://hunter.com/ Vaccine Information Statement Td Vaccine (04/13/15) This information is not intended to replace advice given to  you by your health care provider. Make sure you discuss any questions you have with your health care provider. Document Released: 10/16/2005 Document Revised: 08/06/2017 Document Reviewed: 08/06/2017 Elsevier Interactive Patient Education  [employer].

## 2018-07-19 NOTE — Progress Notes (Signed)
BP 105/69    Pulse 62    Temp 98 F (36.7 C) (Oral)    Ht 5\' 11"  (1.803 m)    Wt 193 lb (87.5 kg)    SpO2 97%    BMI 26.92 kg/m    Subjective:    Patient ID: Darius Williams, male    DOB: 18-Aug-1945, 73 y.o.   MRN: [id number]  HPI: Darius Williams is a 73 y.o. male presenting on 07/19/2018 for comprehensive medical examination. Current medical complaints include:  BPH- had TURP in October and notes that he has had issues with ejactulation and erections. Has not  BPH status: better Satisfied with current treatment?: yes Medication side effects: not on anything Duration: chronic Nocturia: no Urinary frequency:no Incomplete voiding: no Urgency: no Weak urinary stream: no Straining to start stream: no Dysuria: no Onset: gradual Severity: mild  HYPERLIPIDEMIA Hyperlipidemia status: not on anything Satisfied with current treatment?  yes Side effects:  Not on anything Supplements: none Aspirin:  no The 10-year ASCVD risk score Mikey Bussing DC Jr., et al., 2013) is: 15%   Values used to calculate the score:     Age: 59 years     Sex: Male     Is Non-Hispanic African American: No     Diabetic: No     Tobacco smoker: No     Systolic Blood Pressure: 858 mmHg     Is BP treated: No     HDL Cholesterol: 48 mg/dL     Total Cholesterol: 192 mg/dL Chest pain:  no Coronary artery disease:  no  He currently lives with: wife Interim Problems from his last visit: no  Functional Status Survey: Is the patient deaf or have difficulty hearing?: No Does the patient have difficulty seeing, even when wearing glasses/contacts?: No Does the patient have difficulty concentrating, remembering, or making decisions?: No Does the patient have difficulty walking or climbing stairs?: No Does the patient have difficulty dressing or bathing?: No Does the patient have difficulty doing errands alone such as visiting a doctor's office or shopping?: No  FALL RISK: Fall Risk  07/19/2018 03/22/2017 03/21/2016  03/18/2015  Falls in the past year? 0 Yes No No  Number falls in past yr: 0 1 - -  Injury with Fall? 1 No - -  Follow up Falls evaluation completed Falls prevention discussed - -    Depression Screen Depression screen [facility] 2/9 07/19/2018 03/22/2017 03/21/2016 03/18/2015  Decreased Interest 0 0 0 1  Down, Depressed, Hopeless 0 0 0 0  PHQ - 2 Score 0 0 0 1  Altered sleeping 0 - - -  Tired, decreased energy 0 - - -  Change in appetite 0 - - -  Feeling bad or failure about yourself  0 - - -  Trouble concentrating 0 - - -  Moving slowly or fidgety/restless 0 - - -  Suicidal thoughts 0 - - -  PHQ-9 Score 0 - - -  Difficult doing work/chores Not difficult at all - - -   Advanced Directives <no information>  Past Medical History:  Past Medical History:  Diagnosis Date   BPH (benign prostatic hyperplasia) 03/18/2015   Cancer ([place])    SKIN CANCER FOREHEAD   Fatigue 03/18/2015   History of kidney stones    H/O   Hypercholesteremia 03/18/2015   Personal history of kidney stones 07/15/2014   Tinnitus 06/13/2017    Surgical History:  Past Surgical History:  Procedure Laterality Date   CHOLECYSTECTOMY  CYSTOSCOPY WITH LITHOLAPAXY N/A 10/30/2017   Procedure: CYSTOSCOPY WITH LITHOLAPAXY;  Surgeon: Royston Cowper, MD;  Location: [facility] ORS;  Service: Urology;  Laterality: N/A;   GREEN LIGHT LASER TURP (TRANSURETHRAL RESECTION OF PROSTATE N/A 10/30/2017   Procedure: GREEN LIGHT LASER TURP (TRANSURETHRAL RESECTION OF PROSTATE;  Surgeon: Royston Cowper, MD;  Location: [facility] ORS;  Service: Urology;  Laterality: N/A;   HOLMIUM LASER APPLICATION N/A 20/35/5974   Procedure: HOLMIUM LASER APPLICATION;  Surgeon: Royston Cowper, MD;  Location: [facility] OR[facility];  Service: Urology;  Laterality: N/A;   LITHOTRIPSY  09/12/2007   LITHOTRIPSY  11/07/2007    Medications:  No current outpatient medications on file prior to visit.   No current facility-administered medications on file prior to  visit.     Allergies:  Allergies  Allergen Reactions   Other     TRISCUITS-RASH   Penicillin G Benzathine Rash    Has patient had a PCN reaction causing immediate rash, facial/tongue/throat swelling, SOB or lightheadedness with hypotension: Yes Has patient had a PCN reaction causing severe rash involving mucus membranes or skin necrosis: No Has patient had a PCN reaction that required hospitalization: No Has patient had a PCN reaction occurring within the last 10 years: No If all of the above answers are "NO", then may proceed with Cephalosporin use.     Social History:  Social History   Socioeconomic History   Marital status: Married    Spouse name: Not on file   Number of children: Not on file   Years of education: Not on file   Highest education level: Not on file  Occupational History   Not on file  Social Needs   Financial resource strain: Not hard at all   Food insecurity    Worry: Never true    Inability: Never true   Transportation needs    Medical: No    Non-medical: No  Tobacco Use   Smoking status: Never Smoker   Smokeless tobacco: Never Used  Substance and Sexual Activity   Alcohol use: No    Alcohol/week: 0.0 standard drinks   Drug use: No   Sexual activity: Never  Lifestyle   Physical activity    Days per week: 0 days    Minutes per session: 0 min   Stress: Not at all  Relationships   Social connections    Talks on phone: More than three times a week    Gets together: More than three times a week    Attends religious service: More than 4 times per year    Active member of club or organization: Yes    Attends meetings of clubs or organizations: Not on file    Relationship status: Married   Intimate partner violence    Fear of current or ex partner: No    Emotionally abused: No    Physically abused: No    Forced sexual activity: No  Other Topics Concern   Not on file  Social History Narrative   Not on file   Social  History   Tobacco Use  Smoking Status Never Smoker  Smokeless Tobacco Never Used   Social History   Substance and Sexual Activity  Alcohol Use No   Alcohol/week: 0.0 standard drinks    Family History:  Family History  Problem Relation Age of Onset   Cancer Mother        lung   Cancer Sister        breast   Cancer Father  prostate and skin   Varicose Veins Son    Cancer Paternal Grandmother        brain    Past medical history, surgical history, medications, allergies, family history and social history reviewed with patient today and changes made to appropriate areas of the chart.   Review of Systems  Constitutional: Negative.   HENT: Positive for hearing loss and tinnitus. Negative for congestion, ear discharge, ear pain, nosebleeds, sinus pain and sore throat.   Eyes: Positive for blurred vision (occasionally about 1x every 2 months- has a floater, has seen opthalmology about it). Negative for double vision, photophobia, pain, discharge and redness.  Respiratory: Negative.  Negative for stridor.   Cardiovascular: Negative.   Gastrointestinal: Negative for abdominal pain, blood in stool, constipation, diarrhea, heartburn, melena, nausea and vomiting.  Genitourinary: Negative.        Issues with ejaculation and erection  Musculoskeletal: Negative.   Skin: Negative.   Endo/Heme/Allergies: Positive for environmental allergies. Negative for polydipsia. Does not bruise/bleed easily.    All other ROS negative except what is listed above and in the HPI.      Objective:    BP 105/69    Pulse 62    Temp 98 F (36.7 C) (Oral)    Ht 5\' 11"  (1.803 m)    Wt 193 lb (87.5 kg)    SpO2 97%    BMI 26.92 kg/m   Wt Readings from Last 3 Encounters:  07/19/18 193 lb (87.5 kg)  03/01/18 200 lb (90.7 kg)  10/30/17 194 lb (88 kg)    Physical Exam Vitals signs and nursing note reviewed.  Constitutional:      General: He is not in acute distress.    Appearance: Normal  appearance. He is normal weight. He is not ill-appearing, toxic-appearing or diaphoretic.  HENT:     Head: Normocephalic and atraumatic.     Right Ear: Tympanic membrane, ear canal and external ear normal. There is no impacted cerumen.     Left Ear: Tympanic membrane, ear canal and external ear normal. There is no impacted cerumen.     Nose: Nose normal. No congestion or rhinorrhea.     Mouth/Throat:     Mouth: Mucous membranes are moist.     Pharynx: Oropharynx is clear. No oropharyngeal exudate or posterior oropharyngeal erythema.  Eyes:     General: No scleral icterus.       Right eye: No discharge.        Left eye: No discharge.     Extraocular Movements: Extraocular movements intact.     Conjunctiva/sclera: Conjunctivae normal.     Pupils: Pupils are equal, round, and reactive to light.  Neck:     Musculoskeletal: Normal range of motion and neck supple. No neck rigidity or muscular tenderness.     Vascular: No carotid bruit.  Cardiovascular:     Rate and Rhythm: Normal rate and regular rhythm.     Pulses: Normal pulses.     Heart sounds: No murmur. No friction rub. No gallop.   Pulmonary:     Effort: Pulmonary effort is normal. No respiratory distress.     Breath sounds: Normal breath sounds. No stridor. No wheezing, rhonchi or rales.  Chest:     Chest wall: No tenderness.  Abdominal:     General: Abdomen is flat. Bowel sounds are normal. There is no distension.     Palpations: Abdomen is soft. There is no mass.     Tenderness: There is no  abdominal tenderness. There is no right CVA tenderness, left CVA tenderness, guarding or rebound.     Hernia: No hernia is present.  Genitourinary:    Comments: Genital exam deferred with shared decision making Musculoskeletal:        General: No swelling, tenderness, deformity or signs of injury.     Right lower leg: No edema.     Left lower leg: No edema.  Lymphadenopathy:     Cervical: No cervical adenopathy.  Skin:    General:  Skin is warm and dry.     Capillary Refill: Capillary refill takes less than 2 seconds.     Coloration: Skin is not jaundiced or pale.     Findings: No bruising, erythema, lesion or rash.     Comments: Open wound R leg, consistent with bug bite. Healing well  Neurological:     General: No focal deficit present.     Mental Status: He is alert and oriented to person, place, and time.     Cranial Nerves: No cranial nerve deficit.     Sensory: No sensory deficit.     Motor: No weakness.     Coordination: Coordination normal.     Gait: Gait normal.     Deep Tendon Reflexes: Reflexes normal.  Psychiatric:        Mood and Affect: Mood normal.        Behavior: Behavior normal.        Thought Content: Thought content normal.        Judgment: Judgment normal.     6CIT Screen 07/19/2018 03/22/2017  What Year? 0 points 0 points  What month? 0 points 0 points  What time? 0 points 0 points  Count back from 20 0 points 0 points  Months in reverse 0 points 0 points  Repeat phrase 2 points 0 points  Total Score 2 0     Results for orders placed or performed in visit on 07/19/18  Microscopic Examination   URINE  Result Value Ref Range   WBC, UA None seen 0 - 5 /hpf   RBC 0-2 0 - 2 /hpf   Epithelial Cells (non renal) None seen 0 - 10 /hpf   Mucus, UA Present Not Estab.   Bacteria, UA None seen None seen/Few  CBC with Differential/Platelet  Result Value Ref Range   WBC 5.0 3.4 - 10.8 x10E3/uL   RBC 4.44 4.14 - 5.80 x10E6/uL   Hemoglobin 14.2 13.0 - 17.7 g/dL   Hematocrit 41.7 37.5 - 51.0 %   MCV 94 79 - 97 fL   MCH 32.0 26.6 - 33.0 pg   MCHC 34.1 31.5 - 35.7 g/dL   RDW 12.8 11.6 - 15.4 %   Platelets 148 (L) 150 - 450 x10E3/uL   Neutrophils 62 Not Estab. %   Lymphs 24 Not Estab. %   Monocytes 10 Not Estab. %   Eos 3 Not Estab. %   Basos 1 Not Estab. %   Neutrophils Absolute 3.1 1.4 - 7.0 x10E3/uL   Lymphocytes Absolute 1.2 0.7 - 3.1 x10E3/uL   Monocytes Absolute 0.5 0.1 - 0.9  x10E3/uL   EOS (ABSOLUTE) 0.2 0.0 - 0.4 x10E3/uL   Basophils Absolute 0.1 0.0 - 0.2 x10E3/uL   Immature Granulocytes 0 Not Estab. %   Immature Grans (Abs) 0.0 0.0 - 0.1 x10E3/uL  Comprehensive metabolic panel  Result Value Ref Range   Glucose 103 (H) 65 - 99 mg/dL   BUN 10 8 - 27 mg/dL  Creatinine, Ser 0.88 0.76 - 1.27 mg/dL   GFR calc non Af Amer 86 >59 mL/min/1.73   GFR calc Af Amer 99 >59 mL/min/1.73   BUN/Creatinine Ratio 11 10 - 24   Sodium 138 134 - 144 mmol/L   Potassium 4.0 3.5 - 5.2 mmol/L   Chloride 100 96 - 106 mmol/L   CO2 25 20 - 29 mmol/L   Calcium 8.9 8.6 - 10.2 mg/dL   Total Protein 7.1 6.0 - 8.5 g/dL   Albumin 4.6 3.7 - 4.7 g/dL   Globulin, Total 2.5 1.5 - 4.5 g/dL   Albumin/Globulin Ratio 1.8 1.2 - 2.2   Bilirubin Total 0.7 0.0 - 1.2 mg/dL   Alkaline Phosphatase 79 39 - 117 IU/L   AST 21 0 - 40 IU/L   ALT 21 0 - 44 IU/L  Lipid Panel w/o Chol/HDL Ratio  Result Value Ref Range   Cholesterol, Total 192 100 - 199 mg/dL   Triglycerides 127 0 - 149 mg/dL   HDL 48 >39 mg/dL   VLDL Cholesterol Cal 25 5 - 40 mg/dL   LDL Calculated 119 (H) 0 - 99 mg/dL  PSA  Result Value Ref Range   Prostate Specific Ag, Serum 0.7 0.0 - 4.0 ng/mL  TSH  Result Value Ref Range   TSH 1.520 0.450 - 4.500 uIU/mL  UA/M w/rflx Culture, Routine   Specimen: Urine   URINE  Result Value Ref Range   Specific Gravity, UA 1.025 1.005 - 1.030   pH, UA 5.5 5.0 - 7.5   Color, UA Yellow Yellow   Appearance Ur Clear Clear   Leukocytes,UA Negative Negative   Protein,UA Negative Negative/Trace   Glucose, UA Negative Negative   Ketones, UA Negative Negative   RBC, UA 1+ (A) Negative   Bilirubin, UA Negative Negative   Urobilinogen, Ur 0.2 0.2 - 1.0 mg/dL   Nitrite, UA Negative Negative   Microscopic Examination See below:       Assessment & Plan:   Problem List Items Addressed This Visit      Genitourinary   BPH (benign prostatic hyperplasia)    Advised patient to follow up with  urology. Call with any concerns. Continue to monitor.       Relevant Orders   PSA (Completed)   UA/M w/rflx Culture, Routine (Completed)     Other   Fatigue    Labs drawn today. Await results. Call with any concerns.       Relevant Orders   CBC with Differential/Platelet (Completed)   Comprehensive metabolic panel (Completed)   TSH (Completed)   Hypercholesteremia    Labs drawn today. Await results. Call with any concerns.       Relevant Orders   Comprehensive metabolic panel (Completed)   Lipid Panel w/o Chol/HDL Ratio (Completed)    Other Visit Diagnoses    Encounter for Medicare annual wellness exam    -  Primary   Preventative care discussed today as below. Call with any concerns.    Open wound of right lower leg, initial encounter       Healing well. Due for Td. Given today.        Preventative Services:  Health Risk Assessment and Personalized Prevention Plan: Done today Bone Mass Measurements: N/A CVD Screening: Done today Colon Cancer Screening: Up to date Depression Screening: Done today Diabetes Screening: Done today Glaucoma Screening: See your eye doctor Hepatitis B vaccine: N/A Hepatitis C screening: up to date  HIV Screening: Up to date Flu Vaccine:  Get in the fall Lung cancer Screening: N/A Obesity Screening: Done today Pneumonia Vaccines (2): up to date STI Screening: N/A PSA screening: Done today  Discussed aspirin prophylaxis for myocardial infarction prevention and decision was it was not indicated  LABORATORY TESTING:  Health maintenance labs ordered today as discussed above.   The natural history of prostate cancer and ongoing controversy regarding screening and potential treatment outcomes of prostate cancer has been discussed with the patient. The meaning of a false positive PSA and a false negative PSA has been discussed. He indicates understanding of the limitations of this screening test and wishes  to proceed with screening PSA  testing.  IMMUNIZATIONS:   - Tdap: Tetanus vaccination status reviewed: Td vaccination indicated and given today. - Influenza: Postponed to flu season - Pneumovax: Up to date - Prevnar: Up to date - Zostavax vaccine: Up to date  SCREENING: - Colonoscopy: Up to date  Discussed with patient purpose of the colonoscopy is to detect colon cancer at curable precancerous or early stages   PATIENT COUNSELING:    Sexuality: Discussed sexually transmitted diseases, partner selection, use of condoms, avoidance of unintended pregnancy  and contraceptive alternatives.   Advised to avoid cigarette smoking.  I discussed with the patient that most people either abstain from alcohol or drink within safe limits (<=14/week and <=4 drinks/occasion for males, <=7/weeks and <= 3 drinks/occasion for females) and that the risk for alcohol disorders and other health effects rises proportionally with the number of drinks per week and how often a drinker exceeds daily limits.  Discussed cessation/primary prevention of drug use and availability of treatment for abuse.   Diet: Encouraged to adjust caloric intake to maintain  or achieve ideal body weight, to reduce intake of dietary saturated fat and total fat, to limit sodium intake by avoiding high sodium foods and not adding table salt, and to maintain adequate dietary potassium and calcium preferably from fresh fruits, vegetables, and low-fat dairy products.    stressed the importance of regular exercise  Injury prevention: Discussed safety belts, safety helmets, smoke detector, smoking near bedding or upholstery.   Dental health: Discussed importance of regular tooth brushing, flossing, and dental visits.   Follow up plan: NEXT PREVENTATIVE PHYSICAL DUE IN 1 YEAR. Return in about 1 year (around 07/19/2019) for Wellness/follow up.

## 2018-07-20 LAB — CBC WITH DIFFERENTIAL/PLATELET
Basophils Absolute: 0.1 10*3/uL (ref 0.0–0.2)
Basos: 1 %
EOS (ABSOLUTE): 0.2 10*3/uL (ref 0.0–0.4)
Eos: 3 %
Hematocrit: 41.7 % (ref 37.5–51.0)
Hemoglobin: 14.2 g/dL (ref 13.0–17.7)
Immature Grans (Abs): 0 10*3/uL (ref 0.0–0.1)
Immature Granulocytes: 0 %
Lymphocytes Absolute: 1.2 10*3/uL (ref 0.7–3.1)
Lymphs: 24 %
MCH: 32 pg (ref 26.6–33.0)
MCHC: 34.1 g/dL (ref 31.5–35.7)
MCV: 94 fL (ref 79–97)
Monocytes Absolute: 0.5 10*3/uL (ref 0.1–0.9)
Monocytes: 10 %
Neutrophils Absolute: 3.1 10*3/uL (ref 1.4–7.0)
Neutrophils: 62 %
Platelets: 148 10*3/uL — ABNORMAL LOW (ref 150–450)
RBC: 4.44 x10E6/uL (ref 4.14–5.80)
RDW: 12.8 % (ref 11.6–15.4)
WBC: 5 10*3/uL (ref 3.4–10.8)

## 2018-07-20 LAB — COMPREHENSIVE METABOLIC PANEL
ALT: 21 IU/L (ref 0–44)
AST: 21 IU/L (ref 0–40)
Albumin/Globulin Ratio: 1.8 (ref 1.2–2.2)
Albumin: 4.6 g/dL (ref 3.7–4.7)
Alkaline Phosphatase: 79 IU/L (ref 39–117)
BUN/Creatinine Ratio: 11 (ref 10–24)
BUN: 10 mg/dL (ref 8–27)
Bilirubin Total: 0.7 mg/dL (ref 0.0–1.2)
CO2: 25 mmol/L (ref 20–29)
Calcium: 8.9 mg/dL (ref 8.6–10.2)
Chloride: 100 mmol/L (ref 96–106)
Creatinine, Ser: 0.88 mg/dL (ref 0.76–1.27)
GFR calc Af Amer: 99 mL/min/{1.73_m2} (ref >59)
GFR calc non Af Amer: 86 mL/min/{1.73_m2} (ref >59)
Globulin, Total: 2.5 g/dL (ref 1.5–4.5)
Glucose: 103 mg/dL — ABNORMAL HIGH (ref 65–99)
Potassium: 4 mmol/L (ref 3.5–5.2)
Sodium: 138 mmol/L (ref 134–144)
Total Protein: 7.1 g/dL (ref 6.0–8.5)

## 2018-07-20 LAB — LIPID PANEL W/O CHOL/HDL RATIO
Cholesterol, Total: 192 mg/dL (ref 100–199)
HDL: 48 mg/dL (ref >39)
LDL Calculated: 119 mg/dL — ABNORMAL HIGH (ref 0–99)
Triglycerides: 127 mg/dL (ref 0–149)
VLDL Cholesterol Cal: 25 mg/dL (ref 5–40)

## 2018-07-20 LAB — TSH: TSH: 1.52 u[IU]/mL (ref 0.450–4.500)

## 2018-07-20 LAB — PSA: Prostate Specific Ag, Serum: 0.7 ng/mL (ref 0.0–4.0)

## 2018-07-21 ENCOUNTER — Encounter: Payer: Self-pay | Admitting: Family Medicine

## 2018-07-21 NOTE — Assessment & Plan Note (Signed)
Advised patient to follow up with urology. Call with any concerns. Continue to monitor.

## 2018-07-21 NOTE — Assessment & Plan Note (Signed)
Labs drawn today. Await results. Call with any concerns.  

## 2018-07-22 ENCOUNTER — Encounter: Payer: Self-pay | Admitting: Family Medicine

## 2018-08-14 DIAGNOSIS — N401 Enlarged prostate with lower urinary tract symptoms: Secondary | ICD-10-CM | POA: Diagnosis not present

## 2018-08-14 DIAGNOSIS — R351 Nocturia: Secondary | ICD-10-CM | POA: Diagnosis not present

## 2018-08-14 DIAGNOSIS — Z125 Encounter for screening for malignant neoplasm of prostate: Secondary | ICD-10-CM | POA: Diagnosis not present

## 2018-11-12 DIAGNOSIS — Z23 Encounter for immunization: Secondary | ICD-10-CM | POA: Diagnosis not present

## 2018-11-27 ENCOUNTER — Other Ambulatory Visit: Payer: Self-pay

## 2019-04-05 IMAGING — CR DG ABDOMEN 1V
1 series · 2 of 2 positions shown · non-contrast
Comparison: Prior abdominal radiographs 11/07/2007

CLINICAL DATA: 71-year-old male with kidney stones

EXAM:
ABDOMEN - 1 VIEW

[Series 1: dg abd 1 view · 0.14mm/px · 2 of 2 slices shown]
[im 1/2]
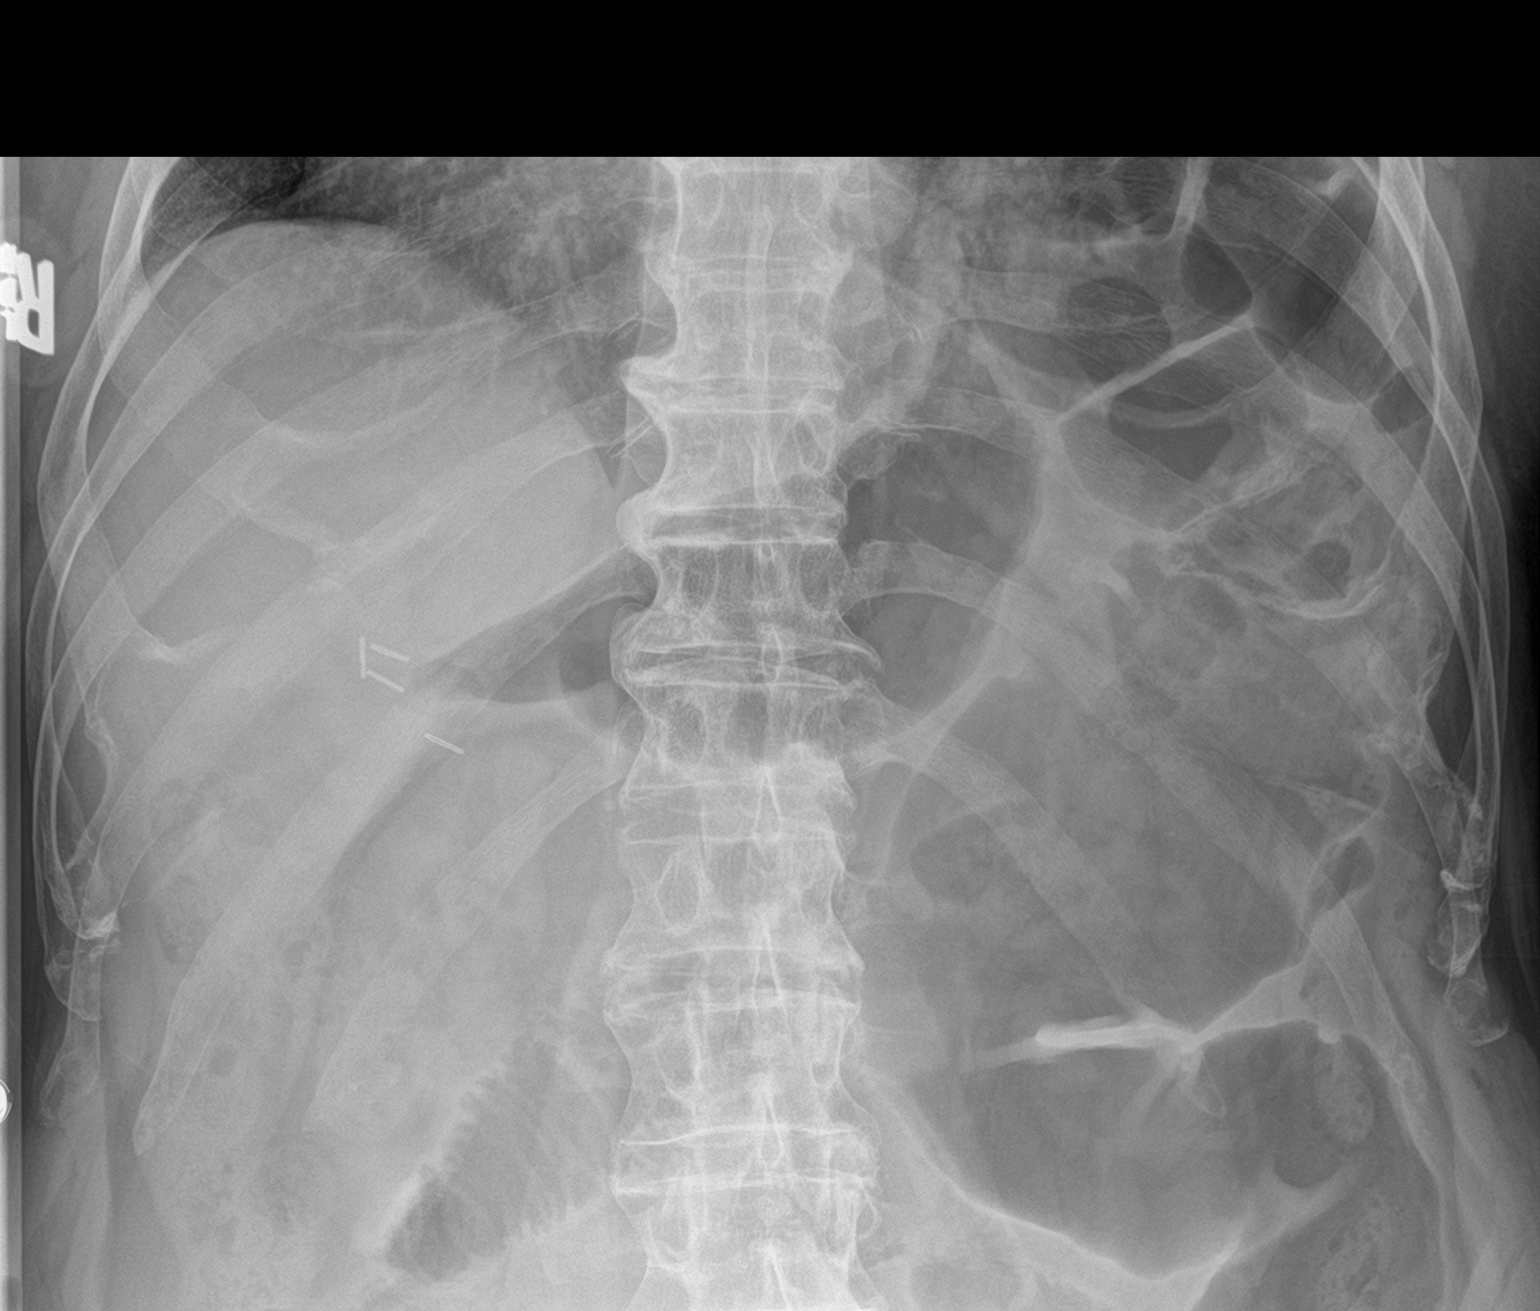
[im 2/2]
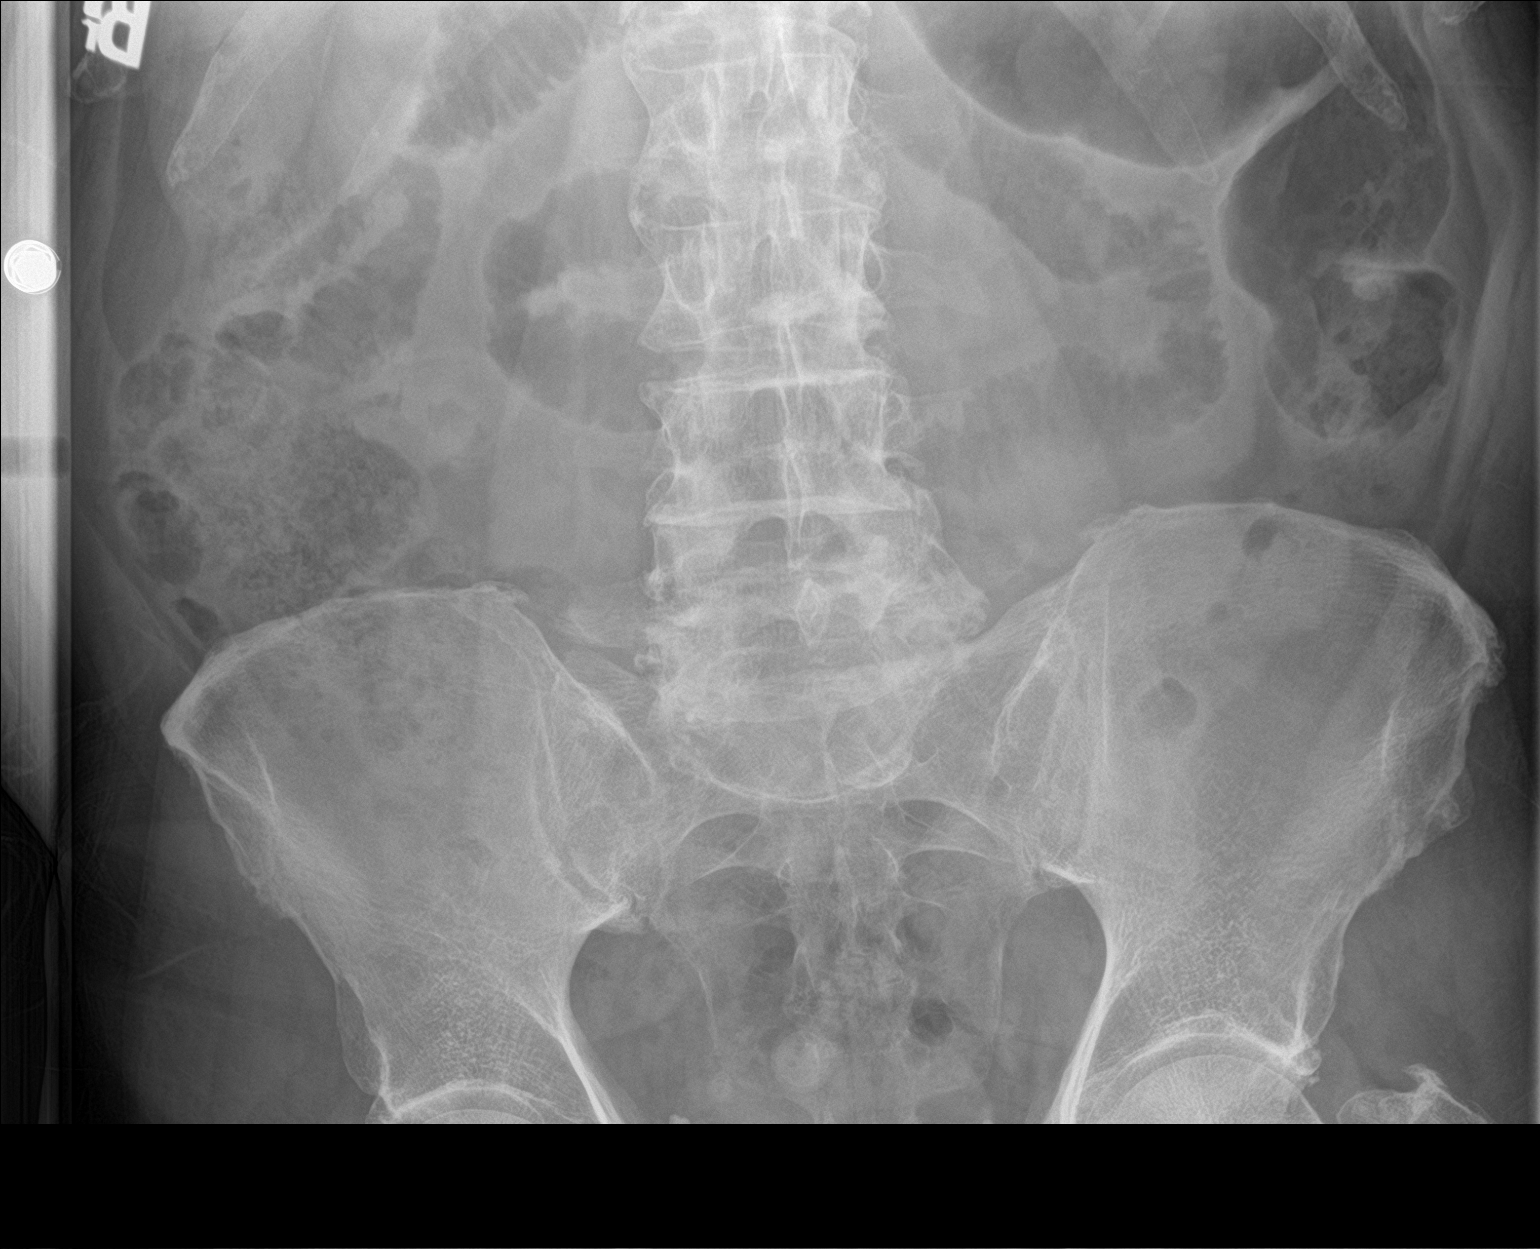

[2 of 2 positions shown; findings below may reference images not displayed]

FINDINGS: Gaseous distension of the colon without evidence of obstruction.
Rounded calcifications overlying the anatomic pelvis appears similar
compared to prior images consistent with vascular phleboliths. A
punctate radiopacity projects over the lower pole of the right renal
shadow. This may represent a small renal stone. No definite
nephrolithiasis on the left. Surgical clips in the right upper
quadrant suggest prior cholecystectomy. The lung bases are clear.
Mild multilevel degenerative disc disease. Mild bilateral hip joint
osteoarthritis.
IMPRESSION: 1. Possible punctate right lower pole nephrolithiasis.
2. Multilevel degenerative disc disease.
3. Mild bilateral hip joint osteoarthritis.
4. Vascular phleboliths noted overlying the anatomic pelvis.

## 2019-06-09 DIAGNOSIS — E7800 Pure hypercholesterolemia, unspecified: Secondary | ICD-10-CM | POA: Diagnosis not present

## 2019-06-09 DIAGNOSIS — R5383 Other fatigue: Secondary | ICD-10-CM | POA: Diagnosis not present

## 2019-06-09 DIAGNOSIS — I712 Thoracic aortic aneurysm, without rupture: Secondary | ICD-10-CM | POA: Diagnosis not present

## 2019-06-09 DIAGNOSIS — I77810 Thoracic aortic ectasia: Secondary | ICD-10-CM | POA: Diagnosis not present

## 2019-06-13 ENCOUNTER — Other Ambulatory Visit: Payer: Self-pay | Admitting: Student

## 2019-06-13 DIAGNOSIS — I77810 Thoracic aortic ectasia: Secondary | ICD-10-CM

## 2019-06-13 DIAGNOSIS — I712 Thoracic aortic aneurysm, without rupture, unspecified: Secondary | ICD-10-CM

## 2019-06-16 ENCOUNTER — Telehealth: Payer: Self-pay | Admitting: Radiology

## 2019-06-26 ENCOUNTER — Other Ambulatory Visit: Payer: Self-pay

## 2019-06-26 ENCOUNTER — Ambulatory Visit
Admission: RE | Admit: 2019-06-26 | Discharge: 2019-06-26 | Disposition: A | Discharge status: Home / Self Care | Payer: Medicare Other | Source: Ambulatory Visit | Attending: Student | Admitting: Student

## 2019-06-26 DIAGNOSIS — I77810 Thoracic aortic ectasia: Secondary | ICD-10-CM

## 2019-06-26 DIAGNOSIS — I712 Thoracic aortic aneurysm, without rupture, unspecified: Secondary | ICD-10-CM

## 2019-06-26 DIAGNOSIS — R918 Other nonspecific abnormal finding of lung field: Secondary | ICD-10-CM | POA: Diagnosis not present

## 2019-07-08 ENCOUNTER — Telehealth: Payer: Self-pay | Admitting: Family Medicine

## 2019-07-08 NOTE — Telephone Encounter (Signed)
Copied from [place] [id number]. Topic: Medicare AWV >> Jul 08, 2019 10:39 AM Cher Nakai R wrote: Reason for CRM:  Lmtcb to reschedule AWVS from 07/24/19 to 07/21/19 or 07/25/19 due to [facility] schedule changes.  Needs to be by phone and 45 minutes

## 2019-07-21 ENCOUNTER — Telehealth: Payer: Self-pay | Admitting: Family Medicine

## 2019-07-21 NOTE — Telephone Encounter (Signed)
Patient is calling back regarding his appt on 07/24/19. Patient is request a change of appt from AWV to CPE. Please advise Cb- [phone]

## 2019-07-22 NOTE — Telephone Encounter (Signed)
Called pt to advise him that because he has medicare his appointment will be scheduled as a annual follow up because medicare will not pay for a physical. No answer, will call back.

## 2019-07-24 ENCOUNTER — Ambulatory Visit: Payer: Medicare Other

## 2019-07-24 ENCOUNTER — Ambulatory Visit: Payer: Medicare Other | Admitting: Family Medicine

## 2019-08-18 DIAGNOSIS — N401 Enlarged prostate with lower urinary tract symptoms: Secondary | ICD-10-CM | POA: Diagnosis not present

## 2019-08-18 DIAGNOSIS — R350 Frequency of micturition: Secondary | ICD-10-CM | POA: Diagnosis not present

## 2019-08-18 DIAGNOSIS — N5201 Erectile dysfunction due to arterial insufficiency: Secondary | ICD-10-CM | POA: Diagnosis not present

## 2019-08-18 DIAGNOSIS — Z125 Encounter for screening for malignant neoplasm of prostate: Secondary | ICD-10-CM | POA: Diagnosis not present

## 2019-09-03 ENCOUNTER — Ambulatory Visit: Payer: Medicare Other | Admitting: Family Medicine

## 2019-09-04 ENCOUNTER — Ambulatory Visit: Payer: Medicare Other | Admitting: Family Medicine

## 2019-09-05 ENCOUNTER — Ambulatory Visit (INDEPENDENT_AMBULATORY_CARE_PROVIDER_SITE_OTHER): Payer: Medicare Other | Admitting: Family Medicine

## 2019-09-05 ENCOUNTER — Other Ambulatory Visit: Payer: Self-pay

## 2019-09-05 ENCOUNTER — Encounter: Payer: Self-pay | Admitting: Family Medicine

## 2019-09-05 VITALS — BP 123/75 | HR 67 | Temp 97.7°F | Ht 70.87 in | Wt 195.0 lb

## 2019-09-05 DIAGNOSIS — G629 Polyneuropathy, unspecified: Secondary | ICD-10-CM

## 2019-09-05 DIAGNOSIS — N400 Enlarged prostate without lower urinary tract symptoms: Secondary | ICD-10-CM | POA: Diagnosis not present

## 2019-09-05 DIAGNOSIS — Z23 Encounter for immunization: Secondary | ICD-10-CM

## 2019-09-05 DIAGNOSIS — R5383 Other fatigue: Secondary | ICD-10-CM

## 2019-09-05 DIAGNOSIS — Z8639 Personal history of other endocrine, nutritional and metabolic disease: Secondary | ICD-10-CM

## 2019-09-05 DIAGNOSIS — E7800 Pure hypercholesterolemia, unspecified: Secondary | ICD-10-CM | POA: Diagnosis not present

## 2019-09-05 DIAGNOSIS — Z0000 Encounter for general adult medical examination without abnormal findings: Secondary | ICD-10-CM

## 2019-09-05 DIAGNOSIS — Z87442 Personal history of urinary calculi: Secondary | ICD-10-CM

## 2019-09-05 DIAGNOSIS — R739 Hyperglycemia, unspecified: Secondary | ICD-10-CM | POA: Diagnosis not present

## 2019-09-05 LAB — URINALYSIS, ROUTINE W REFLEX MICROSCOPIC
Bilirubin, UA: NEGATIVE
Glucose, UA: NEGATIVE
Ketones, UA: NEGATIVE
Leukocytes,UA: NEGATIVE
Nitrite, UA: NEGATIVE
Protein,UA: NEGATIVE
Specific Gravity, UA: 1.025 (ref 1.005–1.030)
Urobilinogen, Ur: 1 mg/dL (ref 0.2–1.0)
pH, UA: 6 (ref 5.0–7.5)

## 2019-09-05 LAB — BAYER DCA HB A1C WAIVED: HB A1C (BAYER DCA - WAIVED): 6.1 % (ref <7.0)

## 2019-09-05 LAB — MICROSCOPIC EXAMINATION
Bacteria, UA: NONE SEEN
WBC, UA: NONE SEEN /hpf (ref 0–5)

## 2019-09-05 NOTE — Patient Instructions (Signed)
Health Maintenance After Age 74 After age 74, you are at a higher risk for certain long-term diseases and infections as well as injuries from falls. Falls are a major cause of broken bones and head injuries in people who are older than age 74. Getting regular preventive care can help to keep you healthy and well. Preventive care includes getting regular testing and making lifestyle changes as recommended by your health care provider. Talk with your health care provider about:  Which screenings and tests you should have. A screening is a test that checks for a disease when you have no symptoms.  A diet and exercise plan that is right for you. What should I know about screenings and tests to prevent falls? Screening and testing are the best ways to find a health problem early. Early diagnosis and treatment give you the best chance of managing medical conditions that are common after age 74. Certain conditions and lifestyle choices may make you more likely to have a fall. Your health care provider may recommend:  Regular vision checks. Poor vision and conditions such as cataracts can make you more likely to have a fall. If you wear glasses, make sure to get your prescription updated if your vision changes.  Medicine review. Work with your health care provider to regularly review all of the medicines you are taking, including over-the-counter medicines. Ask your health care provider about any side effects that may make you more likely to have a fall. Tell your health care provider if any medicines that you take make you feel dizzy or sleepy.  Osteoporosis screening. Osteoporosis is a condition that causes the bones to get weaker. This can make the bones weak and cause them to break more easily.  Blood pressure screening. Blood pressure changes and medicines to control blood pressure can make you feel dizzy.  Strength and balance checks. Your health care provider may recommend certain tests to check your  strength and balance while standing, walking, or changing positions.  Foot health exam. Foot pain and numbness, as well as not wearing proper footwear, can make you more likely to have a fall.  Depression screening. You may be more likely to have a fall if you have a fear of falling, feel emotionally low, or feel unable to do activities that you used to do.  Alcohol use screening. Using too much alcohol can affect your balance and may make you more likely to have a fall. What actions can I take to lower my risk of falls? General instructions  Talk with your health care provider about your risks for falling. Tell your health care provider if: ? You fall. Be sure to tell your health care provider about all falls, even ones that seem minor. ? You feel dizzy, sleepy, or off-balance.  Take over-the-counter and prescription medicines only as told by your health care provider. These include any supplements.  Eat a healthy diet and maintain a healthy weight. A healthy diet includes low-fat dairy products, low-fat (lean) meats, and fiber from whole grains, beans, and lots of fruits and vegetables. Home safety  Remove any tripping hazards, such as rugs, cords, and clutter.  Install safety equipment such as grab bars in bathrooms and safety rails on stairs.  Keep rooms and walkways well-lit. Activity   Follow a regular exercise program to stay fit. This will help you maintain your balance. Ask your health care provider what types of exercise are appropriate for you.  If you need a cane or   walker, use it as recommended by your health care provider.  Wear supportive shoes that have nonskid soles. Lifestyle  Do not drink alcohol if your health care provider tells you not to drink.  If you drink alcohol, limit how much you have: ? 0-1 drink a day for women. ? 0-2 drinks a day for men.  Be aware of how much alcohol is in your drink. In the [place] one drink equals one typical bottle of beer (12  oz), one-half glass of wine (5 oz), or one shot of hard liquor (1 oz).  Do not use any products that contain nicotine or tobacco, such as cigarettes and e-cigarettes. If you need help quitting, ask your health care provider. Summary  Having a healthy lifestyle and getting preventive care can help to protect your health and wellness after age 74.  Screening and testing are the best way to find a health problem early and help you avoid having a fall. Early diagnosis and treatment give you the best chance for managing medical conditions that are more common for people who are older than age 74.  Falls are a major cause of broken bones and head injuries in people who are older than age 74. Take precautions to prevent a fall at home.  Work with your health care provider to learn what changes you can make to improve your health and wellness and to prevent falls. This information is not intended to replace advice given to you by your health care provider. Make sure you discuss any questions you have with your health care provider. Document Revised: 04/11/2018 Document Reviewed: 11/01/2016 Elsevier Patient Education  2020 [employer]  

## 2019-09-05 NOTE — Progress Notes (Signed)
BP 123/75 (BP Location: Left Arm, Patient Position: Sitting, Cuff Size: Normal)   Pulse 67   Temp 97.7 F (36.5 C) (Oral)   Ht 5' 10.87" (1.8 m)   Wt 195 lb (88.5 kg)   SpO2 97%   BMI 27.30 kg/m    Subjective:    Patient ID: Darius Williams, male    DOB: 10-27-1945, 74 y.o.   MRN: [id number]  HPI: UNIQUE SEARFOSS is a 74 y.o. male presenting on 09/05/2019 for comprehensive medical examination. Current medical complaints include:  NEUROPATHY Neuropathy status: new onset about 2 weeks ago  Location: both feet, worse in the R Pain: no Severity: moderate  Quality:  Numb, "like it's asleep" Frequency: constant Bilateral: yes Symmetric: no Numbness: yes Decreased sensation: no Weakness: no Context: better/stable Alleviating factors: unknown Aggravating factors: unknown Treatments attempted: none  He currently lives with: wife Interim Problems from his last visit: no  Functional Status Survey: Is the patient deaf or have difficulty hearing?: No Does the patient have difficulty seeing, even when wearing glasses/contacts?: No Does the patient have difficulty concentrating, remembering, or making decisions?: No Does the patient have difficulty walking or climbing stairs?: No Does the patient have difficulty dressing or bathing?: Yes Does the patient have difficulty doing errands alone such as visiting a doctor's office or shopping?: No  FALL RISK: Fall Risk  09/05/2019 11/27/2018 07/19/2018 03/22/2017 03/21/2016  Falls in the past year? 1 0 0 Yes No  Comment - Emmi Telephone Survey: data to providers prior to load - - -  Number falls in past yr: 0 - 0 1 -  Injury with Fall? 0 - 1 No -  Follow up - - Falls evaluation completed Falls prevention discussed -    Depression Screen Depression screen [facility] 2/9 09/05/2019 07/19/2018 03/22/2017 03/21/2016 03/18/2015  Decreased Interest 0 0 0 0 1  Down, Depressed, Hopeless 0 0 0 0 0  PHQ - 2 Score 0 0 0 0 1  Altered sleeping - 0 - - -  Tired,  decreased energy - 0 - - -  Change in appetite - 0 - - -  Feeling bad or failure about yourself  - 0 - - -  Trouble concentrating - 0 - - -  Moving slowly or fidgety/restless - 0 - - -  Suicidal thoughts - 0 - - -  PHQ-9 Score - 0 - - -  Difficult doing work/chores - Not difficult at all - - -    Advanced Directives Doesn't have one  Past Medical History:  Past Medical History:  Diagnosis Date  . BPH (benign prostatic hyperplasia) 03/18/2015  . Cancer ([place])    SKIN CANCER FOREHEAD  . Fatigue 03/18/2015  . History of kidney stones    H/O  . Hypercholesteremia 03/18/2015  . Personal history of kidney stones 07/15/2014  . Tinnitus 06/13/2017    Surgical History:  Past Surgical History:  Procedure Laterality Date  . CHOLECYSTECTOMY    . CYSTOSCOPY WITH LITHOLAPAXY N/A 10/30/2017   Procedure: CYSTOSCOPY WITH LITHOLAPAXY;  Surgeon: Royston Cowper, MD;  Location: [facility] ORS;  Service: Urology;  Laterality: N/A;  . GREEN LIGHT LASER TURP (TRANSURETHRAL RESECTION OF PROSTATE N/A 10/30/2017   Procedure: GREEN LIGHT LASER TURP (TRANSURETHRAL RESECTION OF PROSTATE;  Surgeon: Royston Cowper, MD;  Location: [facility] ORS;  Service: Urology;  Laterality: N/A;  . HOLMIUM LASER APPLICATION N/A [id number]   Procedure: HOLMIUM LASER APPLICATION;  Surgeon: Royston Cowper, MD;  Location: [facility]  ORS;  Service: Urology;  Laterality: N/A;  . LITHOTRIPSY  09/12/2007  . LITHOTRIPSY  11/07/2007    Medications:  No current outpatient medications on file prior to visit.   No current facility-administered medications on file prior to visit.    Allergies:  Allergies  Allergen Reactions  . Other     TRISCUITS-RASH  . Penicillin G Benzathine Rash    Has patient had a PCN reaction causing immediate rash, facial/tongue/throat swelling, SOB or lightheadedness with hypotension: Yes Has patient had a PCN reaction causing severe rash involving mucus membranes or skin necrosis: No Has patient had a PCN  reaction that required hospitalization: No Has patient had a PCN reaction occurring within the last 10 years: No If all of the above answers are "NO", then may proceed with Cephalosporin use.     Social History:  Social History   Socioeconomic History  . Marital status: Married    Spouse name: Not on file  . Number of children: Not on file  . Years of education: Not on file  . Highest education level: Not on file  Occupational History  . Not on file  Tobacco Use  . Smoking status: Never Smoker  . Smokeless tobacco: Never Used  Vaping Use  . Vaping Use: Never used  Substance and Sexual Activity  . Alcohol use: No    Alcohol/week: 0.0 standard drinks  . Drug use: No  . Sexual activity: Never  Other Topics Concern  . Not on file  Social History Narrative  . Not on file   Social Determinants of Health   Financial Resource Strain:   . Difficulty of Paying Living Expenses: Not on file  Food Insecurity:   . Worried About Charity fundraiser in the Last Year: Not on file  . Ran Out of Food in the Last Year: Not on file  Transportation Needs:   . Lack of Transportation (Medical): Not on file  . Lack of Transportation (Non-Medical): Not on file  Physical Activity:   . Days of Exercise per Week: Not on file  . Minutes of Exercise per Session: Not on file  Stress:   . Feeling of Stress : Not on file  Social Connections:   . Frequency of Communication with Friends and Family: Not on file  . Frequency of Social Gatherings with Friends and Family: Not on file  . Attends Religious Services: Not on file  . Active Member of Clubs or Organizations: Not on file  . Attends Archivist Meetings: Not on file  . Marital Status: Not on file  Intimate Partner Violence:   . Fear of Current or Ex-Partner: Not on file  . Emotionally Abused: Not on file  . Physically Abused: Not on file  . Sexually Abused: Not on file   Social History   Tobacco Use  Smoking Status Never  Smoker  Smokeless Tobacco Never Used   Social History   Substance and Sexual Activity  Alcohol Use No  . Alcohol/week: 0.0 standard drinks    Family History:  Family History  Problem Relation Age of Onset  . Cancer Mother        lung  . Cancer Sister        breast  . Cancer Father        prostate and skin  . Varicose Veins Son   . Cancer Paternal Grandmother        brain    Past medical history, surgical history, medications, allergies, family  history and social history reviewed with patient today and changes made to appropriate areas of the chart.   Review of Systems  Constitutional: Negative.   HENT: Negative.   Eyes: Positive for blurred vision. Negative for double vision, photophobia, pain, discharge and redness.  Respiratory: Negative.   Cardiovascular: Negative.   Gastrointestinal: Negative.   Genitourinary: Negative.   Musculoskeletal: Negative.   Skin: Negative.   Neurological: Negative.   Endo/Heme/Allergies: Negative.   Psychiatric/Behavioral: Negative.     All other ROS negative except what is listed above and in the HPI.      Objective:    BP 123/75 (BP Location: Left Arm, Patient Position: Sitting, Cuff Size: Normal)   Pulse 67   Temp 97.7 F (36.5 C) (Oral)   Ht 5' 10.87" (1.8 m)   Wt 195 lb (88.5 kg)   SpO2 97%   BMI 27.30 kg/m   Wt Readings from Last 3 Encounters:  09/05/19 195 lb (88.5 kg)  07/19/18 193 lb (87.5 kg)  03/01/18 200 lb (90.7 kg)    Physical Exam Vitals and nursing note reviewed.  Constitutional:      General: He is not in acute distress.    Appearance: Normal appearance. He is obese. He is not ill-appearing, toxic-appearing or diaphoretic.  HENT:     Head: Normocephalic and atraumatic.     Right Ear: Tympanic membrane, ear canal and external ear normal. There is no impacted cerumen.     Left Ear: Tympanic membrane, ear canal and external ear normal. There is no impacted cerumen.     Nose: Nose normal. No congestion or  rhinorrhea.     Mouth/Throat:     Mouth: Mucous membranes are moist.     Pharynx: Oropharynx is clear. No oropharyngeal exudate or posterior oropharyngeal erythema.  Eyes:     General: No scleral icterus.       Right eye: No discharge.        Left eye: No discharge.     Extraocular Movements: Extraocular movements intact.     Conjunctiva/sclera: Conjunctivae normal.     Pupils: Pupils are equal, round, and reactive to light.  Neck:     Vascular: No carotid bruit.  Cardiovascular:     Rate and Rhythm: Normal rate and regular rhythm.     Pulses: Normal pulses.     Heart sounds: No murmur heard.  No friction rub. No gallop.   Pulmonary:     Effort: Pulmonary effort is normal. No respiratory distress.     Breath sounds: Normal breath sounds. No stridor. No wheezing, rhonchi or rales.  Chest:     Chest wall: No tenderness.  Abdominal:     General: Abdomen is flat. Bowel sounds are normal. There is no distension.     Palpations: Abdomen is soft. There is no mass.     Tenderness: There is no abdominal tenderness. There is no right CVA tenderness, left CVA tenderness, guarding or rebound.     Hernia: No hernia is present.  Genitourinary:    Comments: Genital exam deferred with shared decision making Musculoskeletal:        General: No swelling, tenderness, deformity or signs of injury.     Cervical back: Normal range of motion and neck supple. No rigidity. No muscular tenderness.     Right lower leg: No edema.     Left lower leg: No edema.  Lymphadenopathy:     Cervical: No cervical adenopathy.  Skin:    General: Skin is  warm and dry.     Capillary Refill: Capillary refill takes less than 2 seconds.     Coloration: Skin is not jaundiced or pale.     Findings: No bruising, erythema, lesion or rash.  Neurological:     General: No focal deficit present.     Mental Status: He is alert and oriented to person, place, and time.     Cranial Nerves: No cranial nerve deficit.     Sensory:  No sensory deficit.     Motor: No weakness.     Coordination: Coordination normal.     Gait: Gait normal.     Deep Tendon Reflexes: Reflexes normal.  Psychiatric:        Mood and Affect: Mood normal.        Behavior: Behavior normal.        Thought Content: Thought content normal.        Judgment: Judgment normal.     6CIT Screen 09/05/2019 07/19/2018 03/22/2017  What Year? 0 points 0 points 0 points  What month? 0 points 0 points 0 points  What time? 0 points 0 points 0 points  Count back from 20 0 points 0 points 0 points  Months in reverse 2 points 0 points 0 points  Repeat phrase 2 points 2 points 0 points  Total Score 4 2 0     Results for orders placed or performed in visit on 09/05/19  Microscopic Examination   Urine  Result Value Ref Range   WBC, UA None seen 0 - 5 /hpf   RBC 0-2 0 - 2 /hpf   Epithelial Cells (non renal) 0-10 0 - 10 /hpf   Bacteria, UA None seen None seen/Few  CBC with Differential/Platelet  Result Value Ref Range   WBC 4.9 3.4 - 10.8 x10E3/uL   RBC 4.15 4.14 - 5.80 x10E6/uL   Hemoglobin 13.6 13.0 - 17.7 g/dL   Hematocrit 40.3 37.5 - 51.0 %   MCV 97 79 - 97 fL   MCH 32.8 26.6 - 33.0 pg   MCHC 33.7 31 - 35 g/dL   RDW 13.1 11.6 - 15.4 %   Platelets 127 (L) 150 - 450 x10E3/uL   Neutrophils 66 Not Estab. %   Lymphs 19 Not Estab. %   Monocytes 11 Not Estab. %   Eos 3 Not Estab. %   Basos 1 Not Estab. %   Neutrophils Absolute 3.2 1 - 7 x10E3/uL   Lymphocytes Absolute 0.9 0 - 3 x10E3/uL   Monocytes Absolute 0.5 0 - 0 x10E3/uL   EOS (ABSOLUTE) 0.2 0.0 - 0.4 x10E3/uL   Basophils Absolute 0.0 0 - 0 x10E3/uL   Immature Granulocytes 0 Not Estab. %   Immature Grans (Abs) 0.0 0.0 - 0.1 x10E3/uL  Urinalysis, Routine w reflex microscopic  Result Value Ref Range   Specific Gravity, UA 1.025 1.005 - 1.030   pH, UA 6.0 5.0 - 7.5   Color, UA Yellow Yellow   Appearance Ur Clear Clear   Leukocytes,UA Negative Negative   Protein,UA Negative Negative/Trace     Glucose, UA Negative Negative   Ketones, UA Negative Negative   RBC, UA Trace (A) Negative   Bilirubin, UA Negative Negative   Urobilinogen, Ur 1.0 0.2 - 1.0 mg/dL   Nitrite, UA Negative Negative   Microscopic Examination See below:   Bayer DCA Hb A1c Waived  Result Value Ref Range   HB A1C (BAYER DCA - WAIVED) 6.1 <7.0 %  Comprehensive metabolic panel  Result Value Ref Range   Glucose 119 (H) 65 - 99 mg/dL   BUN 11 8 - 27 mg/dL   Creatinine, Ser 0.95 0.76 - 1.27 mg/dL   GFR calc non Af Amer 79 >59 mL/min/1.73   GFR calc Af Amer 91 >59 mL/min/1.73   BUN/Creatinine Ratio 12 10 - 24   Sodium 139 134 - 144 mmol/L   Potassium 4.7 3.5 - 5.2 mmol/L   Chloride 101 96 - 106 mmol/L   CO2 27 20 - 29 mmol/L   Calcium 9.0 8.6 - 10.2 mg/dL   Total Protein 6.8 6.0 - 8.5 g/dL   Albumin 4.3 3.7 - 4.7 g/dL   Globulin, Total 2.5 1.5 - 4.5 g/dL   Albumin/Globulin Ratio 1.7 1.2 - 2.2   Bilirubin Total 1.0 0.0 - 1.2 mg/dL   Alkaline Phosphatase 69 48 - 121 IU/L   AST 24 0 - 40 IU/L   ALT 22 0 - 44 IU/L  Lipid Panel w/o Chol/HDL Ratio  Result Value Ref Range   Cholesterol, Total 172 100 - 199 mg/dL   Triglycerides 100 0 - 149 mg/dL   HDL 41 >39 mg/dL   VLDL Cholesterol Cal 18 5 - 40 mg/dL   LDL Chol Calc (NIH) 113 (H) 0 - 99 mg/dL  Ehrlichia antibody panel  Result Value Ref Range   E.Chaffeensis (HME) IgG WILL FOLLOW    E. Chaffeensis (HME) IgM Titer WILL FOLLOW    HGE IgG Titer WILL FOLLOW    HGE IgM Titer WILL FOLLOW   Babesia microti Antibody Panel  Result Value Ref Range   Babesia microti IgM <1:10 Neg:<1:10   Babesia microti IgG <1:10 Neg:<1:10  Rocky mtn spotted fvr abs pnl(IgG+IgM)  Result Value Ref Range   RMSF IgG Negative Negative   RMSF IgM 0.28 0.00 - 0.89 index  Lyme Ab/Western Blot Reflex  Result Value Ref Range   Lyme IgG/IgM Ab <0.91 0.00 - 0.90 ISR   LYME DISEASE AB, QUANT, IGM <0.80 0.00 - 0.79 index  TSH  Result Value Ref Range   TSH 1.580 0.450 - 4.500  uIU/mL      Assessment & Plan:   Problem List Items Addressed This Visit      Genitourinary   BPH (benign prostatic hyperplasia)    Under good control on current regimen. Continue current regimen. Continue to monitor. Call with any concerns. Refills given. Labs drawn today.          Other   Personal history of kidney stones    UA checked today. Await results.       Relevant Orders   Urinalysis, Routine w reflex microscopic (Completed)   Fatigue    Checking labs today. Await results. Treat as needed.       Relevant Orders   TSH   Hypercholesteremia    Rechecking labs today. Await results. Treat as needed.       Relevant Orders   Comprehensive metabolic panel   Lipid Panel w/o Chol/HDL Ratio    Other Visit Diagnoses    Encounter for Medicare annual wellness exam    -  Primary   Preventative care discussed today as below.    Neuropathy       Will check labs today. Await results, if all normal, consider lumbar x-ray. Await results.    Relevant Orders   CBC with Differential/Platelet (Completed)   Comprehensive metabolic panel   TSH   Lyme Ab/Western Blot Reflex   Babesia microti Antibody Panel  Rocky mtn spotted fvr abs pnl(IgG+IgM)   Ehrlichia Antibody Panel   Bayer DCA Hb A1c Waived (Completed)   History of vitamin D deficiency       Labs drawn today. Await results. Treat as needed.    Need for influenza vaccination       Flu shot given today.    Relevant Orders   Flu Vaccine QUAD High Dose(Fluad) (Completed)       Preventative Services:  Health Risk Assessment and Personalized Prevention Plan: Done today Bone Mass Measurements: N/A CVD Screening: Done today Colon Cancer Screening: up to date Depression Screening: Done today Diabetes Screening: Done today Glaucoma Screening: See your eye doctor.  Hepatitis B vaccine:N/A Hepatitis C screening: up to date HIV Screening: up to ate Flu Vaccine: Done today Lung cancer Screening: N/A Obesity Screening:  Done today Pneumonia Vaccines (2): up to date STI Screening: N/A PSA screening: Done today.   LABORATORY TESTING:  Health maintenance labs ordered today as discussed above.   The natural history of prostate cancer and ongoing controversy regarding screening and potential treatment outcomes of prostate cancer has been discussed with the patient. The meaning of a false positive PSA and a false negative PSA has been discussed. He indicates understanding of the limitations of this screening test and wishes to proceed with screening PSA testing.   IMMUNIZATIONS:   - Tdap: Tetanus vaccination status reviewed: last tetanus booster within 10 years. - Influenza: Administered today - Pneumovax: Up to date - Prevnar: Up to date - COVID vaccine: Up to date  SCREENING: - Colonoscopy: Up to date  Discussed with patient purpose of the colonoscopy is to detect colon cancer at curable precancerous or early stages   PATIENT COUNSELING:    Sexuality: Discussed sexually transmitted diseases, partner selection, use of condoms, avoidance of unintended pregnancy  and contraceptive alternatives.   Advised to avoid cigarette smoking.  I discussed with the patient that most people either abstain from alcohol or drink within safe limits (<=14/week and <=4 drinks/occasion for males, <=7/weeks and <= 3 drinks/occasion for females) and that the risk for alcohol disorders and other health effects rises proportionally with the number of drinks per week and how often a drinker exceeds daily limits.  Discussed cessation/primary prevention of drug use and availability of treatment for abuse.   Diet: Encouraged to adjust caloric intake to maintain  or achieve ideal body weight, to reduce intake of dietary saturated fat and total fat, to limit sodium intake by avoiding high sodium foods and not adding table salt, and to maintain adequate dietary potassium and calcium preferably from fresh fruits, vegetables, and low-fat  dairy products.    stressed the importance of regular exercise  Injury prevention: Discussed safety belts, safety helmets, smoke detector, smoking near bedding or upholstery.   Dental health: Discussed importance of regular tooth brushing, flossing, and dental visits.   Follow up plan: NEXT PREVENTATIVE PHYSICAL DUE IN 1 YEAR. Return in about 3 months (around 12/05/2019).

## 2019-09-10 NOTE — Assessment & Plan Note (Signed)
UA checked today. Await results.

## 2019-09-10 NOTE — Assessment & Plan Note (Signed)
Checking labs today. Await results. Treat as needed.  

## 2019-09-10 NOTE — Assessment & Plan Note (Signed)
Under good control on current regimen. Continue current regimen. Continue to monitor. Call with any concerns. Refills given. Labs drawn today.   

## 2019-09-10 NOTE — Assessment & Plan Note (Signed)
Rechecking labs today. Await results. Treat as needed.  °

## 2019-09-11 LAB — CBC WITH DIFFERENTIAL/PLATELET
Basophils Absolute: 0 10*3/uL (ref 0.0–0.2)
Basos: 1 %
EOS (ABSOLUTE): 0.2 10*3/uL (ref 0.0–0.4)
Eos: 3 %
Hematocrit: 40.3 % (ref 37.5–51.0)
Hemoglobin: 13.6 g/dL (ref 13.0–17.7)
Immature Grans (Abs): 0 10*3/uL (ref 0.0–0.1)
Immature Granulocytes: 0 %
Lymphocytes Absolute: 0.9 10*3/uL (ref 0.7–3.1)
Lymphs: 19 %
MCH: 32.8 pg (ref 26.6–33.0)
MCHC: 33.7 g/dL (ref 31.5–35.7)
MCV: 97 fL (ref 79–97)
Monocytes Absolute: 0.5 10*3/uL (ref 0.1–0.9)
Monocytes: 11 %
Neutrophils Absolute: 3.2 10*3/uL (ref 1.4–7.0)
Neutrophils: 66 %
Platelets: 127 10*3/uL — ABNORMAL LOW (ref 150–450)
RBC: 4.15 x10E6/uL (ref 4.14–5.80)
RDW: 13.1 % (ref 11.6–15.4)
WBC: 4.9 10*3/uL (ref 3.4–10.8)

## 2019-09-11 LAB — COMPREHENSIVE METABOLIC PANEL
ALT: 22 IU/L (ref 0–44)
AST: 24 IU/L (ref 0–40)
Albumin/Globulin Ratio: 1.7 (ref 1.2–2.2)
Albumin: 4.3 g/dL (ref 3.7–4.7)
Alkaline Phosphatase: 69 IU/L (ref 48–121)
BUN/Creatinine Ratio: 12 (ref 10–24)
BUN: 11 mg/dL (ref 8–27)
Bilirubin Total: 1 mg/dL (ref 0.0–1.2)
CO2: 27 mmol/L (ref 20–29)
Calcium: 9 mg/dL (ref 8.6–10.2)
Chloride: 101 mmol/L (ref 96–106)
Creatinine, Ser: 0.95 mg/dL (ref 0.76–1.27)
GFR calc Af Amer: 91 mL/min/{1.73_m2} (ref >59)
GFR calc non Af Amer: 79 mL/min/{1.73_m2} (ref >59)
Globulin, Total: 2.5 g/dL (ref 1.5–4.5)
Glucose: 119 mg/dL — ABNORMAL HIGH (ref 65–99)
Potassium: 4.7 mmol/L (ref 3.5–5.2)
Sodium: 139 mmol/L (ref 134–144)
Total Protein: 6.8 g/dL (ref 6.0–8.5)

## 2019-09-11 LAB — EHRLICHIA ANTIBODY PANEL
E. Chaffeensis (HME) IgM Titer: NEGATIVE
E.Chaffeensis (HME) IgG: NEGATIVE
HGE IgG Titer: NEGATIVE
HGE IgM Titer: NEGATIVE

## 2019-09-11 LAB — LIPID PANEL W/O CHOL/HDL RATIO
Cholesterol, Total: 172 mg/dL (ref 100–199)
HDL: 41 mg/dL (ref >39)
LDL Chol Calc (NIH): 113 mg/dL — ABNORMAL HIGH (ref 0–99)
Triglycerides: 100 mg/dL (ref 0–149)
VLDL Cholesterol Cal: 18 mg/dL (ref 5–40)

## 2019-09-11 LAB — LYME AB/WESTERN BLOT REFLEX
LYME DISEASE AB, QUANT, IGM: <0.8 index (ref 0.00–0.79)
Lyme IgG/IgM Ab: <0.91 {ISR} (ref 0.00–0.90)

## 2019-09-11 LAB — BABESIA MICROTI ANTIBODY PANEL
Babesia microti IgG: <1:10 {titer}
Babesia microti IgM: <1:10 {titer}

## 2019-09-11 LAB — TSH: TSH: 1.58 u[IU]/mL (ref 0.450–4.500)

## 2019-09-11 LAB — ROCKY MTN SPOTTED FVR ABS PNL(IGG+IGM)
RMSF IgG: NEGATIVE
RMSF IgM: 0.28 index (ref 0.00–0.89)

## 2019-09-16 ENCOUNTER — Encounter: Payer: Self-pay | Admitting: Family Medicine

## 2019-10-06 ENCOUNTER — Ambulatory Visit: Payer: PRIVATE HEALTH INSURANCE | Admitting: Dermatology

## 2019-11-06 ENCOUNTER — Ambulatory Visit (INDEPENDENT_AMBULATORY_CARE_PROVIDER_SITE_OTHER): Payer: Medicare Other | Admitting: Dermatology

## 2019-11-06 ENCOUNTER — Other Ambulatory Visit: Payer: Self-pay

## 2019-11-06 DIAGNOSIS — Z1283 Encounter for screening for malignant neoplasm of skin: Secondary | ICD-10-CM

## 2019-11-06 DIAGNOSIS — D1800 Hemangioma unspecified site: Secondary | ICD-10-CM

## 2019-11-06 DIAGNOSIS — L720 Epidermal cyst: Secondary | ICD-10-CM

## 2019-11-06 DIAGNOSIS — D229 Melanocytic nevi, unspecified: Secondary | ICD-10-CM

## 2019-11-06 DIAGNOSIS — L814 Other melanin hyperpigmentation: Secondary | ICD-10-CM

## 2019-11-06 DIAGNOSIS — L821 Other seborrheic keratosis: Secondary | ICD-10-CM | POA: Diagnosis not present

## 2019-11-06 DIAGNOSIS — L820 Inflamed seborrheic keratosis: Secondary | ICD-10-CM | POA: Diagnosis not present

## 2019-11-06 DIAGNOSIS — L578 Other skin changes due to chronic exposure to nonionizing radiation: Secondary | ICD-10-CM | POA: Diagnosis not present

## 2019-11-06 NOTE — Patient Instructions (Signed)

## 2019-11-06 NOTE — Progress Notes (Signed)
   Follow-Up Visit   Subjective  Darius Williams is a 74 y.o. male who presents for the following: Annual Exam (History of bx proven AK of mid forehead (2015) - TBSE today). The patient presents for Total-Body Skin Exam (TBSE) for skin cancer screening and mole check.  The following portions of the chart were reviewed this encounter and updated as appropriate:  Tobacco  Allergies  Meds  Problems  Med Hx  Surg Hx  Fam Hx     Review of Systems:  No other skin or systemic complaints except as noted in HPI or Assessment and Plan.  Objective  Well appearing patient in no apparent distress; mood and affect are within normal limits.  A full examination was performed including scalp, head, eyes, ears, nose, lips, neck, chest, axillae, abdomen, back, buttocks, bilateral upper extremities, bilateral lower extremities, hands, feet, fingers, toes, fingernails, and toenails. All findings within normal limits unless otherwise noted below.  Objective  Mid Back: Subcutaneous nodule.   Objective  Sup forehead x 1, chest x 20 (21): Erythematous keratotic or waxy stuck-on papule or plaque.    Assessment & Plan    Lentigines - Scattered tan macules - Discussed due to sun exposure - Benign, observe - Call for any changes  Seborrheic Keratoses - Stuck-on, waxy, tan-brown papules and plaques  - Discussed benign etiology and prognosis. - Observe - Call for any changes  Melanocytic Nevi - Tan-brown and/or pink-flesh-colored symmetric macules and papules - Benign appearing on exam today - Observation - Call clinic for new or changing moles - Recommend daily use of broad spectrum spf 30+ sunscreen to sun-exposed areas.   Hemangiomas - Red papules - Discussed benign nature - Observe - Call for any changes  Actinic Damage - Chronic, secondary to cumulative UV/sun exposure - diffuse scaly erythematous macules with underlying dyspigmentation - Recommend daily broad spectrum sunscreen  SPF 30+ to sun-exposed areas, reapply every 2 hours as needed.  - Call for new or changing lesions.  Skin cancer screening performed today.   Epidermal inclusion cyst Mid Back  Benign, observe.    Inflamed seborrheic keratosis (21) Sup forehead x 1, chest x 20  Destruction of lesion - Sup forehead x 1, chest x 20 Complexity: simple   Destruction method: cryotherapy   Informed consent: discussed and consent obtained   Timeout:  patient name, date of birth, surgical site, and procedure verified Lesion destroyed using liquid nitrogen: Yes   Region frozen until ice ball extended beyond lesion: Yes   Outcome: patient tolerated procedure well with no complications   Post-procedure details: wound care instructions given    Skin cancer screening  Return in about 1 year (around 11/05/2020) for TBSE.  I, Ashok Cordia, CMA, am acting as scribe for Sarina Ser, MD .  Documentation: I have reviewed the above documentation for accuracy and completeness, and I agree with the above.  Sarina Ser, MD

## 2019-11-10 ENCOUNTER — Encounter: Payer: Self-pay | Admitting: Dermatology

## 2019-11-12 ENCOUNTER — Ambulatory Visit: Payer: Self-pay

## 2019-11-12 NOTE — Telephone Encounter (Signed)
Patient called and says he is just logging into his MyChart and reviewed his labs from September. He says his glucose is elevated and wants to know what he should be doing. He says when he was in the office in September and discussed about his foot numbness, it was thought it was a tick bite. He says he remembers the conversation about his labs that the labs about the tick bite are normal, but no one said anything about his glucose. He says he just wants to know if this is concerning and doesn't want to wait until his December appointment to discuss. He says his feet are still numb as well. I advised I will send this to Dr. Wynetta Emery and her CMA will call with the recommendation, he verbalized understanding.  Reason for Disposition . [1] Caller requesting NON-URGENT health information AND [2] PCP's office is the best resource  Answer Assessment - Initial Assessment Questions 1. REASON FOR CALL or QUESTION: "What is your reason for calling today?" or "How can I best help you?" or "What question do you have that I can help answer?"     I have concerns about high glucose on September labs  Protocols used: [place]

## 2019-11-13 NOTE — Telephone Encounter (Signed)
His sugars were very slightly elevated, most likely because he had eaten or drank before they were drawn. They were not at all at a dangerous level and we can recheck them when he comes in in December- just make sure he doesn't eat or drink anything other than water or black tea/coffee before he comes in.

## 2019-11-13 NOTE — Telephone Encounter (Signed)
Patient notified of Dr. Johnson's message.  °

## 2019-12-05 ENCOUNTER — Encounter: Payer: Self-pay | Admitting: Family Medicine

## 2019-12-05 ENCOUNTER — Ambulatory Visit (INDEPENDENT_AMBULATORY_CARE_PROVIDER_SITE_OTHER): Payer: Medicare Other | Admitting: Family Medicine

## 2019-12-05 ENCOUNTER — Other Ambulatory Visit: Payer: Self-pay

## 2019-12-05 VITALS — BP 99/62 | HR 56 | Temp 98.1°F | Ht 71.1 in | Wt 188.0 lb

## 2019-12-05 DIAGNOSIS — E1140 Type 2 diabetes mellitus with diabetic neuropathy, unspecified: Secondary | ICD-10-CM | POA: Insufficient documentation

## 2019-12-05 DIAGNOSIS — G629 Polyneuropathy, unspecified: Secondary | ICD-10-CM

## 2019-12-05 DIAGNOSIS — N5233 Erectile dysfunction following urethral surgery: Secondary | ICD-10-CM

## 2019-12-05 DIAGNOSIS — R7301 Impaired fasting glucose: Secondary | ICD-10-CM | POA: Insufficient documentation

## 2019-12-05 NOTE — Assessment & Plan Note (Signed)
Discussed causes of neuropathy. Last labs were normal. Given bilateral nature and no pain, will hold on x-ray. Offered medication, but he declined as it doesn't hurt. Discussed EMG. He would like to hold on that for now. He is very concerned about lack of blood flow and losing a toe. Pulses good. Will get him into vascular for evaluation/possible ABIs to reassure patient. Information about gabapentin, nortriptyline and neuropathy. Call with any concerns.

## 2019-12-05 NOTE — Patient Instructions (Signed)
Peripheral Neuropathy Peripheral neuropathy is a type of nerve damage. It affects nerves that carry signals between the spinal cord and the arms, legs, and the rest of the body (peripheral nerves). It does not affect nerves in the spinal cord or brain. In peripheral neuropathy, one nerve or a group of nerves may be damaged. Peripheral neuropathy is a broad category that includes many specific nerve disorders, like diabetic neuropathy, hereditary neuropathy, and carpal tunnel syndrome. What are the causes? This condition may be caused by:  Diabetes. This is the most common cause of peripheral neuropathy.  Nerve injury.  Pressure or stress on a nerve that lasts a long time.  Lack (deficiency) of B vitamins. This can result from alcoholism, poor diet, or a restricted diet.  Infections.  Autoimmune diseases, such as rheumatoid arthritis and systemic lupus erythematosus.  Nerve diseases that are passed from parent to child (inherited).  Some medicines, such as cancer medicines (chemotherapy).  Poisonous (toxic) substances, such as lead and mercury.  Too little blood flowing to the legs.  Kidney disease.  Thyroid disease. In some cases, the cause of this condition is not known. What are the signs or symptoms? Symptoms of this condition depend on which of your nerves is damaged. Common symptoms include:  Loss of feeling (numbness) in the feet, hands, or both.  Tingling in the feet, hands, or both.  Burning pain.  Very sensitive skin.  Weakness.  Not being able to move a part of the body (paralysis).  Muscle twitching.  Clumsiness or poor coordination.  Loss of balance.  Not being able to control your bladder.  Feeling dizzy.  Sexual problems. How is this diagnosed? Diagnosing and finding the cause of peripheral neuropathy can be difficult. Your health care provider will take your medical history and do a physical exam. A neurological exam will also be done. This  involves checking things that are affected by your brain, spinal cord, and nerves (nervous system). For example, your health care provider will check your reflexes, how you move, and what you can feel. You may have other tests, such as:  Blood tests.  Electromyogram (EMG) and nerve conduction tests. These tests check nerve function and how well the nerves are controlling the muscles.  Imaging tests, such as CT scans or MRI to rule out other causes of your symptoms.  Removing a small piece of nerve to be examined in a lab (nerve biopsy). This is rare.  Removing and examining a small amount of the fluid that surrounds the brain and spinal cord (lumbar puncture). This is rare. How is this treated? Treatment for this condition may involve:  Treating the underlying cause of the neuropathy, such as diabetes, kidney disease, or vitamin deficiencies.  Stopping medicines that can cause neuropathy, such as chemotherapy.  Medicine to relieve pain. Medicines may include: ? Prescription or over-the-counter pain medicine. ? Antiseizure medicine. ? Antidepressants. ? Pain-relieving patches that are applied to painful areas of skin.  Surgery to relieve pressure on a nerve or to destroy a nerve that is causing pain.  Physical therapy to help improve movement and balance.  Devices to help you move around (assistive devices). Follow these instructions at home: Medicines  Take over-the-counter and prescription medicines only as told by your health care provider. Do not take any other medicines without first asking your health care provider.  Do not drive or use heavy machinery while taking prescription pain medicine. Lifestyle   Do not use any products that contain nicotine   or tobacco, such as cigarettes and e-cigarettes. Smoking keeps blood from reaching damaged nerves. If you need help quitting, ask your health care provider.  Avoid or limit alcohol. Too much alcohol can cause a vitamin B  deficiency, and vitamin B is needed for healthy nerves.  Eat a healthy diet. This includes: ? Eating foods that are high in fiber, such as fresh fruits and vegetables, whole grains, and beans. ? Limiting foods that are high in fat and processed sugars, such as fried or sweet foods. General instructions   If you have diabetes, work closely with your health care provider to keep your blood sugar under control.  If you have numbness in your feet: ? Check every day for signs of injury or infection. Watch for redness, warmth, and swelling. ? Wear padded socks and comfortable shoes. These help protect your feet.  Develop a good support system. Living with peripheral neuropathy can be stressful. Consider talking with a mental health specialist or joining a support group.  Use assistive devices and attend physical therapy as told by your health care provider. This may include using a walker or a cane.  Keep all follow-up visits as told by your health care provider. This is important. Contact a health care provider if:  You have new signs or symptoms of peripheral neuropathy.  You are struggling emotionally from dealing with peripheral neuropathy.  Your pain is not well-controlled. Get help right away if:  You have an injury or infection that is not healing normally.  You develop new weakness in an arm or leg.  You fall frequently. Summary  Peripheral neuropathy is when the nerves in the arms, or legs are damaged, resulting in numbness, weakness, or pain.  There are many causes of peripheral neuropathy, including diabetes, pinched nerves, vitamin deficiencies, autoimmune disease, and hereditary conditions.  Diagnosing and finding the cause of peripheral neuropathy can be difficult. Your health care provider will take your medical history, do a physical exam, and do tests, including blood tests and nerve function tests.  Treatment involves treating the underlying cause of the  neuropathy and taking medicines to help control pain. Physical therapy and assistive devices may also help. This information is not intended to replace advice given to you by your health care provider. Make sure you discuss any questions you have with your health care provider. Document Revised: 12/01/2016 Document Reviewed: 02/28/2016 Elsevier Patient Education  [place]. Nortriptyline capsules What is this medicine? NORTRIPTYLINE (nor TRIP ti leen) is used to treat depression. This medicine may be used for other purposes; ask your health care provider or pharmacist if you have questions. COMMON BRAND NAME(S): Aventyl, Pamelor What should I tell my health care provider before I take this medicine? They need to know if you have any of these conditions:  bipolar disorder  Brugada syndrome  difficulty passing urine  glaucoma  heart disease  if you drink alcohol  liver disease  schizophrenia  seizures  suicidal thoughts, plans or attempt; a previous suicide attempt by you or a family member  thyroid disease  an unusual or allergic reaction to nortriptyline, other tricyclic antidepressants, other medicines, foods, dyes, or preservatives  pregnant or trying to get pregnant  breast-feeding How should I use this medicine? Take this medicine by mouth with a glass of water. Follow the directions on the prescription label. Take your doses at regular intervals. Do not take it more often than directed. Do not stop taking this medicine suddenly except upon the  advice of your doctor. Stopping this medicine too quickly may cause serious side effects or your condition may worsen. A special MedGuide will be given to you by the pharmacist with each prescription and refill. Be sure to read this information carefully each time. Talk to your pediatrician regarding the use of this medicine in children. Special care may be needed. Overdosage: If you think you have taken too much of this  medicine contact a poison control center or emergency room at once. NOTE: This medicine is only for you. Do not share this medicine with others. What if I miss a dose? If you miss a dose, take it as soon as you can. If it is almost time for your next dose, take only that dose. Do not take double or extra doses. What may interact with this medicine? Do not take this medicine with any of the following medications:  cisapride  dronedarone  linezolid  MAOIs like Carbex, Eldepryl, Marplan, Nardil, and Parnate  methylene blue (injected into a vein)  pimozide  thioridazine This medicine may also interact with the following medications:  alcohol  antihistamines for allergy, cough, and cold  atropine  certain medicines for bladder problems like oxybutynin, tolterodine  certain medicines for depression like amitriptyline, fluoxetine, sertraline  certain medicines for Parkinson's disease like benztropine, trihexyphenidyl  certain medicines for stomach problems like dicyclomine, hyoscyamine  certain medicines for travel sickness like scopolamine  chlorpropamide  cimetidine  ipratropium  other medicines that prolong the QT interval (an abnormal heart rhythm) like dofetilide  other medicines that can cause serotonin syndrome like St. John's Wort, fentanyl, lithium, tramadol, tryptophan, buspirone, and some medicines for headaches like sumatriptan or rizatriptan  quinidine  reserpine  thyroid medicine This list may not describe all possible interactions. Give your health care provider a list of all the medicines, herbs, non-prescription drugs, or dietary supplements you use. Also tell them if you smoke, drink alcohol, or use illegal drugs. Some items may interact with your medicine. What should I watch for while using this medicine? Tell your doctor if your symptoms do not get better or if they get worse. Visit your doctor or health care professional for regular checks on your  progress. Because it may take several weeks to see the full effects of this medicine, it is important to continue your treatment as prescribed by your doctor. Patients and their families should watch out for new or worsening thoughts of suicide or depression. Also watch out for sudden changes in feelings such as feeling anxious, agitated, panicky, irritable, hostile, aggressive, impulsive, severely restless, overly excited and hyperactive, or not being able to sleep. If this happens, especially at the beginning of treatment or after a change in dose, call your health care professional. Dennis Bast may get drowsy or dizzy. Do not drive, use machinery, or do anything that needs mental alertness until you know how this medicine affects you. Do not stand or sit up quickly, especially if you are an older patient. This reduces the risk of dizzy or fainting spells. Alcohol may interfere with the effect of this medicine. Avoid alcoholic drinks. Do not treat yourself for coughs, colds, or allergies without asking your doctor or health care professional for advice. Some ingredients can increase possible side effects. Your mouth may get dry. Chewing sugarless gum or sucking hard candy, and drinking plenty of water may help. Contact your doctor if the problem does not go away or is severe. This medicine may cause dry eyes and blurred vision.  If you wear contact lenses you may feel some discomfort. Lubricating drops may help. See your eye doctor if the problem does not go away or is severe. This medicine can cause constipation. Try to have a bowel movement at least every 2 to 3 days. If you do not have a bowel movement for 3 days, call your doctor or health care professional. This medicine can make you more sensitive to the sun. Keep out of the sun. If you cannot avoid being in the sun, wear protective clothing and use sunscreen. Do not use sun lamps or tanning beds/booths. What side effects may I notice from receiving this  medicine? Side effects that you should report to your doctor or health care professional as soon as possible:  allergic reactions like skin rash, itching or hives, swelling of the face, lips, or tongue  anxious  breathing problems  changes in vision  confusion  elevated mood, decreased need for sleep, racing thoughts, impulsive behavior  eye pain  fast, irregular heartbeat  feeling faint or lightheaded, falls  feeling agitated, angry, or irritable  fever with increased sweating  hallucination, loss of contact with reality  seizures  stiff muscles  suicidal thoughts or other mood changes  tingling, pain, or numbness in the feet or hands  trouble passing urine or change in the amount of urine  trouble sleeping  unusually weak or tired  vomiting  yellowing of the eyes or skin Side effects that usually do not require medical attention (report to your doctor or health care professional if they continue or are bothersome):  change in sex drive or performance  change in appetite or weight  constipation  dizziness  dry mouth  nausea  tired  tremors  upset stomach This list may not describe all possible side effects. Call your doctor for medical advice about side effects. You may report side effects to FDA at 1-800-FDA-1088. Where should I keep my medicine? Keep out of the reach of children. Store at room temperature between 15 and 30 degrees C (59 and 86 degrees F). Keep container tightly closed. Throw away any unused medicine after the expiration date. NOTE: This sheet is a summary. It may not cover all possible information. If you have questions about this medicine, talk to your doctor, pharmacist, or health care provider.  2020 Elsevier/Gold Standard (2017-12-11 13:24:58) Gabapentin capsules or tablets What is this medicine? GABAPENTIN (GA ba pen tin) is used to control seizures in certain types of epilepsy. It is also used to treat certain types of  nerve pain. This medicine may be used for other purposes; ask your health care provider or pharmacist if you have questions. COMMON BRAND NAME(S): Active-PAC with Gabapentin, Gabarone, Neurontin What should I tell my health care provider before I take this medicine? They need to know if you have any of these conditions:  history of drug abuse or alcohol abuse problem  kidney disease  lung or breathing disease  suicidal thoughts, plans, or attempt; a previous suicide attempt by you or a family member  an unusual or allergic reaction to gabapentin, other medicines, foods, dyes, or preservatives  pregnant or trying to get pregnant  breast-feeding How should I use this medicine? Take this medicine by mouth with a glass of water. Follow the directions on the prescription label. You can take it with or without food. If it upsets your stomach, take it with food. Take your medicine at regular intervals. Do not take it more often than directed. Do  not stop taking except on your doctor's advice. If you are directed to break the 600 or 800 mg tablets in half as part of your dose, the extra half tablet should be used for the next dose. If you have not used the extra half tablet within 28 days, it should be thrown away. A special MedGuide will be given to you by the pharmacist with each prescription and refill. Be sure to read this information carefully each time. Talk to your pediatrician regarding the use of this medicine in children. While this drug may be prescribed for children as young as 3 years for selected conditions, precautions do apply. Overdosage: If you think you have taken too much of this medicine contact a poison control center or emergency room at once. NOTE: This medicine is only for you. Do not share this medicine with others. What if I miss a dose? If you miss a dose, take it as soon as you can. If it is almost time for your next dose, take only that dose. Do not take double or  extra doses. What may interact with this medicine? This medicine may interact with the following medications:  alcohol  antihistamines for allergy, cough, and cold  certain medicines for anxiety or sleep  certain medicines for depression like amitriptyline, fluoxetine, sertraline  certain medicines for seizures like phenobarbital, primidone  certain medicines for stomach problems  general anesthetics like halothane, isoflurane, methoxyflurane, propofol  local anesthetics like lidocaine, pramoxine, tetracaine  medicines that relax muscles for surgery  narcotic medicines for pain  phenothiazines like chlorpromazine, mesoridazine, prochlorperazine, thioridazine This list may not describe all possible interactions. Give your health care provider a list of all the medicines, herbs, non-prescription drugs, or dietary supplements you use. Also tell them if you smoke, drink alcohol, or use illegal drugs. Some items may interact with your medicine. What should I watch for while using this medicine? Visit your doctor or health care provider for regular checks on your progress. You may want to keep a record at home of how you feel your condition is responding to treatment. You may want to share this information with your doctor or health care provider at each visit. You should contact your doctor or health care provider if your seizures get worse or if you have any new types of seizures. Do not stop taking this medicine or any of your seizure medicines unless instructed by your doctor or health care provider. Stopping your medicine suddenly can increase your seizures or their severity. This medicine may cause serious skin reactions. They can happen weeks to months after starting the medicine. Contact your health care provider right away if you notice fevers or flu-like symptoms with a rash. The rash may be red or purple and then turn into blisters or peeling of the skin. Or, you might notice a red  rash with swelling of the face, lips or lymph nodes in your neck or under your arms. Wear a medical identification bracelet or chain if you are taking this medicine for seizures, and carry a card that lists all your medications. You may get drowsy, dizzy, or have blurred vision. Do not drive, use machinery, or do anything that needs mental alertness until you know how this medicine affects you. To reduce dizzy or fainting spells, do not sit or stand up quickly, especially if you are an older patient. Alcohol can increase drowsiness and dizziness. Avoid alcoholic drinks. Your mouth may get dry. Chewing sugarless gum or sucking hard  candy, and drinking plenty of water will help. The use of this medicine may increase the chance of suicidal thoughts or actions. Pay special attention to how you are responding while on this medicine. Any worsening of mood, or thoughts of suicide or dying should be reported to your health care provider right away. Women who become pregnant while using this medicine may enroll in the [place] Pregnancy Registry by calling [phone]. This registry collects information about the safety of antiepileptic drug use during pregnancy. What side effects may I notice from receiving this medicine? Side effects that you should report to your doctor or health care professional as soon as possible:  allergic reactions like skin rash, itching or hives, swelling of the face, lips, or tongue  breathing problems  rash, fever, and swollen lymph nodes  redness, blistering, peeling or loosening of the skin, including inside the mouth  suicidal thoughts, mood changes Side effects that usually do not require medical attention (report to your doctor or health care professional if they continue or are bothersome):  dizziness  drowsiness  headache  nausea, vomiting  swelling of ankles, feet, hands  tiredness This list may not describe all possible side  effects. Call your doctor for medical advice about side effects. You may report side effects to FDA at 1-800-FDA-1088. Where should I keep my medicine? Keep out of reach of children. This medicine may cause accidental overdose and death if it taken by other adults, children, or pets. Mix any unused medicine with a substance like cat litter or coffee grounds. Then throw the medicine away in a sealed container like a sealed bag or a coffee can with a lid. Do not use the medicine after the expiration date. Store at room temperature between 15 and 30 degrees C (59 and 86 degrees F). NOTE: This sheet is a summary. It may not cover all possible information. If you have questions about this medicine, talk to your doctor, pharmacist, or health care provider.  2020 Elsevier/Gold Standard (2018-03-22 14:16:43)

## 2019-12-05 NOTE — Progress Notes (Signed)
BP 99/62   Pulse (!) 56   Temp 98.1 F (36.7 C) (Oral)   Ht 5' 11.1" (1.806 m)   Wt 188 lb (85.3 kg)   SpO2 100%   BMI 26.15 kg/m    Subjective:    Patient ID: Darius Williams, male    DOB: 09/08/1945, 74 y.o.   MRN: [id number]  HPI: Darius Williams is a 74 y.o. male  Chief Complaint  Patient presents with  . Numbness    Bottom of B/L foot and get cold at night   NEUROPATHY- he doesn't really want to take anything for it. He is very worried about his blood flow and that he is going to lose a toe because of this.  Neuropathy status: stable  Satisfied with current treatment?: no Medication side effects: not on anything Location: bilateral bottoms of his feet Pain: no Severity: mild  Quality:  numb Frequency: constant Bilateral: yes Symmetric: yes Numbness: yes Decreased sensation: yes Weakness: no Context:stable  Relevant past medical, surgical, family and social history reviewed and updated as indicated. Interim medical history since our last visit reviewed. Allergies and medications reviewed and updated.  Review of Systems  Constitutional: Negative.   Respiratory: Negative.   Cardiovascular: Negative.   Gastrointestinal: Negative.   Musculoskeletal: Negative.   Skin: Negative.   Neurological: Positive for numbness. Negative for dizziness, tremors, seizures, syncope, facial asymmetry, speech difficulty, weakness, light-headedness and headaches.  Psychiatric/Behavioral: Negative.     Per HPI unless specifically indicated above     Objective:    BP 99/62   Pulse (!) 56   Temp 98.1 F (36.7 C) (Oral)   Ht 5' 11.1" (1.806 m)   Wt 188 lb (85.3 kg)   SpO2 100%   BMI 26.15 kg/m   Wt Readings from Last 3 Encounters:  12/05/19 188 lb (85.3 kg)  09/05/19 195 lb (88.5 kg)  07/19/18 193 lb (87.5 kg)    Physical Exam Vitals and nursing note reviewed.  Constitutional:      General: He is not in acute distress.    Appearance: Normal appearance. He is not  ill-appearing, toxic-appearing or diaphoretic.  HENT:     Head: Normocephalic and atraumatic.     Right Ear: External ear normal.     Left Ear: External ear normal.     Nose: Nose normal.     Mouth/Throat:     Mouth: Mucous membranes are moist.     Pharynx: Oropharynx is clear.  Eyes:     General: No scleral icterus.       Right eye: No discharge.        Left eye: No discharge.     Extraocular Movements: Extraocular movements intact.     Conjunctiva/sclera: Conjunctivae normal.     Pupils: Pupils are equal, round, and reactive to light.  Cardiovascular:     Rate and Rhythm: Normal rate and regular rhythm.     Pulses: Normal pulses.     Heart sounds: Normal heart sounds. No murmur heard.  No friction rub. No gallop.   Pulmonary:     Effort: Pulmonary effort is normal. No respiratory distress.     Breath sounds: Normal breath sounds. No stridor. No wheezing, rhonchi or rales.  Chest:     Chest wall: No tenderness.  Musculoskeletal:        General: Normal range of motion.     Cervical back: Normal range of motion and neck supple.  Skin:    General: Skin  is warm and dry.     Capillary Refill: Capillary refill takes less than 2 seconds.     Coloration: Skin is not jaundiced or pale.     Findings: No bruising, erythema, lesion or rash.     Comments: Normal pulses on foot, no evidence of decreased blood flow  Neurological:     General: No focal deficit present.     Mental Status: He is alert and oriented to person, place, and time. Mental status is at baseline.  Psychiatric:        Mood and Affect: Mood normal.        Behavior: Behavior normal.        Thought Content: Thought content normal.        Judgment: Judgment normal.     Results for orders placed or performed in visit on 09/05/19  Microscopic Examination   Urine  Result Value Ref Range   WBC, UA None seen 0 - 5 /hpf   RBC 0-2 0 - 2 /hpf   Epithelial Cells (non renal) 0-10 0 - 10 /hpf   Bacteria, UA None seen None  seen/Few  CBC with Differential/Platelet  Result Value Ref Range   WBC 4.9 3.4 - 10.8 x10E3/uL   RBC 4.15 4.14 - 5.80 x10E6/uL   Hemoglobin 13.6 13.0 - 17.7 g/dL   Hematocrit 40.3 37.5 - 51.0 %   MCV 97 79 - 97 fL   MCH 32.8 26.6 - 33.0 pg   MCHC 33.7 31 - 35 g/dL   RDW 13.1 11.6 - 15.4 %   Platelets 127 (L) 150 - 450 x10E3/uL   Neutrophils 66 Not Estab. %   Lymphs 19 Not Estab. %   Monocytes 11 Not Estab. %   Eos 3 Not Estab. %   Basos 1 Not Estab. %   Neutrophils Absolute 3.2 1.40 - 7.00 x10E3/uL   Lymphocytes Absolute 0.9 0 - 3 x10E3/uL   Monocytes Absolute 0.5 0 - 0 x10E3/uL   EOS (ABSOLUTE) 0.2 0.0 - 0.4 x10E3/uL   Basophils Absolute 0.0 0 - 0 x10E3/uL   Immature Granulocytes 0 Not Estab. %   Immature Grans (Abs) 0.0 0.0 - 0.1 x10E3/uL  Urinalysis, Routine w reflex microscopic  Result Value Ref Range   Specific Gravity, UA 1.025 1.005 - 1.030   pH, UA 6.0 5.0 - 7.5   Color, UA Yellow Yellow   Appearance Ur Clear Clear   Leukocytes,UA Negative Negative   Protein,UA Negative Negative/Trace   Glucose, UA Negative Negative   Ketones, UA Negative Negative   RBC, UA Trace (A) Negative   Bilirubin, UA Negative Negative   Urobilinogen, Ur 1.0 0.2 - 1.0 mg/dL   Nitrite, UA Negative Negative   Microscopic Examination See below:   Bayer DCA Hb A1c Waived  Result Value Ref Range   HB A1C (BAYER DCA - WAIVED) 6.1 <7.0 %  Comprehensive metabolic panel  Result Value Ref Range   Glucose 119 (H) 65 - 99 mg/dL   BUN 11 8 - 27 mg/dL   Creatinine, Ser 0.95 0.76 - 1.27 mg/dL   GFR calc non Af Amer 79 >59 mL/min/1.73   GFR calc Af Amer 91 >59 mL/min/1.73   BUN/Creatinine Ratio 12 10 - 24   Sodium 139 134 - 144 mmol/L   Potassium 4.7 3.5 - 5.2 mmol/L   Chloride 101 96 - 106 mmol/L   CO2 27 20 - 29 mmol/L   Calcium 9.0 8.6 - 10.2 mg/dL   Total Protein  6.8 6.0 - 8.5 g/dL   Albumin 4.3 3.7 - 4.7 g/dL   Globulin, Total 2.5 1.5 - 4.5 g/dL   Albumin/Globulin Ratio 1.7 1.2 - 2.2    Bilirubin Total 1.0 0.0 - 1.2 mg/dL   Alkaline Phosphatase 69 48 - 121 IU/L   AST 24 0 - 40 IU/L   ALT 22 0 - 44 IU/L  Lipid Panel w/o Chol/HDL Ratio  Result Value Ref Range   Cholesterol, Total 172 100 - 199 mg/dL   Triglycerides 100 0 - 149 mg/dL   HDL 41 >39 mg/dL   VLDL Cholesterol Cal 18 5 - 40 mg/dL   LDL Chol Calc (NIH) 113 (H) 0 - 99 mg/dL  Ehrlichia antibody panel  Result Value Ref Range   E.Chaffeensis (HME) IgG Negative Neg:<1:64   E. Chaffeensis (HME) IgM Titer Negative Neg:<1:20   HGE IgG Titer Negative Neg:<1:64   HGE IgM Titer Negative Neg:<1:20  Babesia microti Antibody Panel  Result Value Ref Range   Babesia microti IgM <1:10 Neg:<1:10   Babesia microti IgG <1:10 Neg:<1:10  Rocky mtn spotted fvr abs pnl(IgG+IgM)  Result Value Ref Range   RMSF IgG Negative Negative   RMSF IgM 0.28 0.00 - 0.89 index  Lyme Ab/Western Blot Reflex  Result Value Ref Range   Lyme IgG/IgM Ab <0.91 0.00 - 0.90 ISR   LYME DISEASE AB, QUANT, IGM <0.80 0.00 - 0.79 index  TSH  Result Value Ref Range   TSH 1.580 0.450 - 4.500 uIU/mL      Assessment & Plan:   Problem List Items Addressed This Visit      Nervous and Auditory   Neuropathy - Primary    Discussed causes of neuropathy. Last labs were normal. Given bilateral nature and no pain, will hold on x-ray. Offered medication, but he declined as it doesn't hurt. Discussed EMG. He would like to hold on that for now. He is very concerned about lack of blood flow and losing a toe. Pulses good. Will get him into vascular for evaluation/possible ABIs to reassure patient. Information about gabapentin, nortriptyline and neuropathy. Call with any concerns.       Relevant Orders   Ambulatory referral to Vascular Surgery    Other Visit Diagnoses    Erectile dysfunction following urethral surgery       Does not want anything for it right now. Will call if he changes his mind.        Follow up plan: Return in about 9 months (around  09/04/2020) for physical.

## 2019-12-08 ENCOUNTER — Other Ambulatory Visit (INDEPENDENT_AMBULATORY_CARE_PROVIDER_SITE_OTHER): Payer: Self-pay | Admitting: Vascular Surgery

## 2019-12-08 DIAGNOSIS — R200 Anesthesia of skin: Secondary | ICD-10-CM

## 2019-12-09 ENCOUNTER — Other Ambulatory Visit: Payer: Self-pay

## 2019-12-09 ENCOUNTER — Ambulatory Visit (INDEPENDENT_AMBULATORY_CARE_PROVIDER_SITE_OTHER): Payer: Medicare Other | Admitting: Nurse Practitioner

## 2019-12-09 ENCOUNTER — Encounter (INDEPENDENT_AMBULATORY_CARE_PROVIDER_SITE_OTHER): Payer: Self-pay | Admitting: Vascular Surgery

## 2019-12-09 ENCOUNTER — Ambulatory Visit (INDEPENDENT_AMBULATORY_CARE_PROVIDER_SITE_OTHER): Payer: Medicare Other

## 2019-12-09 VITALS — BP 124/69 | HR 61 | Resp 16 | Ht 71.0 in | Wt 191.0 lb

## 2019-12-09 DIAGNOSIS — R200 Anesthesia of skin: Secondary | ICD-10-CM

## 2019-12-09 DIAGNOSIS — E7800 Pure hypercholesterolemia, unspecified: Secondary | ICD-10-CM

## 2019-12-14 ENCOUNTER — Encounter (INDEPENDENT_AMBULATORY_CARE_PROVIDER_SITE_OTHER): Payer: Self-pay | Admitting: Nurse Practitioner

## 2019-12-14 NOTE — Progress Notes (Signed)
Subjective:    Patient ID: Darius Williams, male    DOB: 1945-04-07, 74 y.o.   MRN: [id number] Chief Complaint  Patient presents with  . New Patient (Initial Visit)    ref [employer] possible vascular disease    The patient presents today on referral from his primary care provider related to numbness in the bottom of his feet.  He notes that there is not a sharp pain but just a constantly numb feeling units in both of his feet.  He denies any claudication-like symptoms.  He denies any fever, chills, nausea, vomiting or diarrhea.  He denies any wounds or ulcerations.  He denies any rest pain like symptoms.  Today, the patient has noninvasive studies which show an ABI of 1.20 on the right and 1.23 on the left.  The patient has a TBI of 1.06 on the right and 1.09 on the left.  The patient has triphasic tibial artery waveforms bilaterally with strong toe waveforms bilaterally.   Review of Systems  Neurological: Positive for numbness.  All other systems reviewed and are negative.      Objective:   Physical Exam HENT:     Head: Normocephalic.  Cardiovascular:     Rate and Rhythm: Normal rate.     Pulses: Normal pulses.  Pulmonary:     Effort: Pulmonary effort is normal.  Skin:    General: Skin is warm and dry.     Capillary Refill: Capillary refill takes less than 2 seconds.  Neurological:     Mental Status: He is alert and oriented to person, place, and time.  Psychiatric:        Mood and Affect: Mood normal.        Behavior: Behavior normal.        Thought Content: Thought content normal.        Judgment: Judgment normal.     BP 124/69 (BP Location: Right Arm)   Pulse 61   Resp 16   Ht 5\' 11"  (1.803 m)   Wt 191 lb (86.6 kg)   BMI 26.64 kg/m   Past Medical History:  Diagnosis Date  . 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 BPH (benign prostatic hyperplasia) 03/18/2015  . Cancer ([place])    SKIN CANCER FOREHEAD  . Fatigue  03/18/2015  . History of kidney stones    H/O  . Hypercholesteremia 03/18/2015  . Personal history of kidney stones 07/15/2014  . Tinnitus 06/13/2017    Social History   Socioeconomic History  . Marital status: Married    Spouse name: Not on file  . Number of children: Not on file  . Years of education: Not on file  . Highest education level: Not on file  Occupational History  . Not on file  Tobacco Use  . Smoking status: Never Smoker  . Smokeless tobacco: Never Used  Vaping Use  . Vaping Use: Never used  Substance and Sexual Activity  . Alcohol use: No    Alcohol/week: 0.0 standard drinks  . Drug use: No  . Sexual activity: Never  Other Topics Concern  . Not on file  Social History Narrative  . Not on file   Social Determinants of Health   Financial Resource Strain: Not on file  Food Insecurity: Not on file  Transportation Needs: Not on file  Physical Activity: Not on file  Stress: Not on file  Social Connections: Not on file  Intimate Partner  Violence: Not on file    Past Surgical History:  Procedure Laterality Date  . CHOLECYSTECTOMY    . CYSTOSCOPY WITH LITHOLAPAXY N/A 10/30/2017   Procedure: CYSTOSCOPY WITH LITHOLAPAXY;  Surgeon: Royston Cowper, MD;  Location: [facility];  Service: Urology;  Laterality: N/A;  . GREEN LIGHT LASER TURP (TRANSURETHRAL RESECTION OF PROSTATE N/A 10/30/2017   Procedure: GREEN LIGHT LASER TURP (TRANSURETHRAL RESECTION OF PROSTATE;  Surgeon: Royston Cowper, MD;  Location: [facility];  Service: Urology;  Laterality: N/A;  . HOLMIUM LASER APPLICATION N/A 95/09/3265   Procedure: HOLMIUM LASER APPLICATION;  Surgeon: Royston Cowper, MD;  Location: [facility];  Service: Urology;  Laterality: N/A;  . LITHOTRIPSY  09/12/2007  . LITHOTRIPSY  11/07/2007    Family History  Problem Relation Age of Onset  . Cancer Mother        lung  . Cancer Sister        breast  . Cancer Father        prostate and skin  . Varicose Veins Son   . Cancer  Paternal Grandmother        brain    Allergies  Allergen Reactions  . Other     TRISCUITS-RASH  . Penicillin G Benzathine Rash    Has patient had a PCN reaction causing immediate rash, facial/tongue/throat swelling, SOB or lightheadedness with hypotension: Yes Has patient had a PCN reaction causing severe rash involving mucus membranes or skin necrosis: No Has patient had a PCN reaction that required hospitalization: No Has patient had a PCN reaction occurring within the last 10 years: No If all of the above answers are "NO", then may proceed with Cephalosporin use.     CBC Latest Ref Rng & Units 09/05/2019 07/19/2018 10/30/2017  WBC 3.4 - 10.8 x10E3/uL 4.9 5.0 -  Hemoglobin 13.0 - 17.7 g/dL 13.6 14.2 -  Hematocrit 37.5 - 51.0 % 40.3 41.7 -  Platelets 150 - 450 x10E3/uL 127(L) 148(L) 141(L)      CMP     Component Value Date/Time   NA 139 09/05/2019 0923   K 4.7 09/05/2019 0923   CL 101 09/05/2019 0923   CO2 27 09/05/2019 0923   GLUCOSE 119 (H) 09/05/2019 0923   BUN 11 09/05/2019 0923   CREATININE 0.95 09/05/2019 0923   CALCIUM 9.0 09/05/2019 0923   PROT 6.8 09/05/2019 0923   ALBUMIN 4.3 09/05/2019 0923   AST 24 09/05/2019 0923   ALT 22 09/05/2019 0923   ALKPHOS 69 09/05/2019 0923   BILITOT 1.0 09/05/2019 0923   GFRNONAA 79 09/05/2019 0923   GFRAA 91 09/05/2019 0923     VAS [place] ABI WITH/WO TBI  Result Date: 12/12/2019 LOWER EXTREMITY DOPPLER STUDY Indications: Rest pain. High Risk Factors: Diabetes.  Performing Technologist: Charlane Ferretti RT (R)(VS)  Examination Guidelines: A complete evaluation includes at minimum, Doppler waveform signals and systolic blood pressure reading at the level of bilateral brachial, anterior tibial, and posterior tibial arteries, when vessel segments are accessible. Bilateral testing is considered an integral part of a complete examination. Photoelectric Plethysmograph (PPG) waveforms and toe systolic pressure readings are included as  required and additional duplex testing as needed. Limited examinations for reoccurring indications may be performed as noted.  ABI Findings: +---------+------------------+-----+---------+--------+ Right    Rt Pressure (mmHg)IndexWaveform Comment  +---------+------------------+-----+---------+--------+ Brachial 132                                      +---------+------------------+-----+---------+--------+  ATA      161               1.20 triphasic         +---------+------------------+-----+---------+--------+ PTA      160               1.19 triphasic         +---------+------------------+-----+---------+--------+ Great Toe142               1.06 Normal            +---------+------------------+-----+---------+--------+ +---------+------------------+-----+---------+-------+ Left     Lt Pressure (mmHg)IndexWaveform Comment +---------+------------------+-----+---------+-------+ Brachial 134                                     +---------+------------------+-----+---------+-------+ ATA      156               1.16 triphasic        +---------+------------------+-----+---------+-------+ PTA      165               1.23 triphasic        +---------+------------------+-----+---------+-------+ Great Toe146               1.09 Normal           +---------+------------------+-----+---------+-------+ Summary: Right: Resting right ankle-brachial index is within normal range. No evidence of significant right lower extremity arterial disease. The right toe-brachial index is normal. Left: Resting left ankle-brachial index is within normal range. No evidence of significant left lower extremity arterial disease. The left toe-brachial index is normal. *See table(s) above for measurements and observations.  Electronically signed by Leotis Pain MD on 12/12/2019 at 10:58:47 AM.   Final        Assessment & Plan:   1. Numbness in feet Based on noninvasive studies today the patient  has excellent perfusion to his lower extremities which would not account for numbness in his lower extremities.  Based upon his description of symptoms, it is likely that it is related to neuropathy.  Patient will continue to follow with primary care provider for further work-up.  2. Hypercholesteremia Continue statin as ordered and reviewed, no changes at this time    No current outpatient medications on file prior to visit.   No current facility-administered medications on file prior to visit.    There are no Patient Instructions on file for this visit. No follow-ups on file.   Kris Hartmann, NP

## 2020-03-02 ENCOUNTER — Ambulatory Visit: Payer: Self-pay | Admitting: *Deleted

## 2020-03-02 NOTE — Telephone Encounter (Signed)
C/o cold symptoms since 02/26/20 Thursday.  Patient was tested for covid and results negative. Denies fever, sore throat, body aches, difficulty breathing. C/o continued cough and runny nose not getting better. Patient reports clear to yellow sputum after blowing nose. Denies pain in ears. Has been out of work and only working from home if he does work. Patient requesting any medication that may decrease symptoms . Patient is scheduled to go out of town to see family on Friday and a member has had recent surgery and he does not want to be around family if still contagious. Virtual My Chart visit scheduled 03/04/20. Patient requesting if any medication can be called in tomorrow. Care advise given. Patient verbalized understanding of care advise and to call back or go to ED if symptoms worsen.  Reason for Disposition . Common cold with no complications  Answer Assessment - Initial Assessment Questions 1. ONSET: "When did the nasal discharge start?"      02/26/20 2. AMOUNT: "How much discharge is there?"      Blowing nose often , no specific amount reported 3. COUGH: "Do you have a cough?" If yes, ask: "Describe the color of your sputum" (clear, white, yellow, green)     Yes , clear- yellow sputum 4. RESPIRATORY DISTRESS: "Describe your breathing."      Better now no difficulty breathing  5. FEVER: "Do you have a fever?" If Yes, ask: "What is your temperature, how was it measured, and when did it start?"     denies 6. SEVERITY: "Overall, how bad are you feeling right now?" (e.g., doesn't interfere with normal activities, staying home from school/work, staying in bed)      Staying home from work. Better but can not get over symptoms  7. OTHER SYMPTOMS: "Do you have any other symptoms?" (e.g., sore throat, earache, wheezing, vomiting)     No cough and runny nose  8. PREGNANCY: "Is there any chance you are pregnant?" "When was your last menstrual period?"     na  Protocols used: COMMON COLD-A-AH

## 2020-03-04 ENCOUNTER — Telehealth (INDEPENDENT_AMBULATORY_CARE_PROVIDER_SITE_OTHER): Payer: Medicare Other | Admitting: Family Medicine

## 2020-03-04 ENCOUNTER — Encounter: Payer: Self-pay | Admitting: Family Medicine

## 2020-03-04 DIAGNOSIS — J0100 Acute maxillary sinusitis, unspecified: Secondary | ICD-10-CM

## 2020-03-04 DIAGNOSIS — G629 Polyneuropathy, unspecified: Secondary | ICD-10-CM

## 2020-03-04 MED ORDER — DOXYCYCLINE HYCLATE 100 MG PO TABS: 100.0000 mg | ORAL_TABLET | Freq: Two times a day (BID) | ORAL | 0 refills | Status: DC

## 2020-03-04 MED ORDER — PREDNISONE 50 MG PO TABS: 50.0000 mg | ORAL_TABLET | Freq: Every day | ORAL | 0 refills | Status: DC

## 2020-03-04 MED ORDER — HYDROCOD POLST-CPM POLST ER 10-8 MG/5ML PO SUER: 5.0000 mL | Freq: Two times a day (BID) | ORAL | 0 refills | Status: DC | PRN

## 2020-03-04 NOTE — Patient Instructions (Signed)
Bradley and Daroff's neurology in clinical practice (8th ed., pp. 1853- 1929). Elsevier."> Goldman-Cecil medicine (26th ed., pp. 2489- 2501). Elsevier.">  Peripheral Neuropathy Peripheral neuropathy is a type of nerve damage. It affects nerves that carry signals between the spinal cord and the arms, legs, and the rest of the body (peripheral nerves). It does not affect nerves in the spinal cord or brain. In peripheral neuropathy, one nerve or a group of nerves may be damaged. Peripheral neuropathy is a broad category that includes many specific nerve disorders, like diabetic neuropathy, hereditary neuropathy, and carpal tunnel syndrome. What are the causes? This condition may be caused by:  Diabetes. This is the most common cause of peripheral neuropathy.  Nerve injury.  Pressure or stress on a nerve that lasts a long time.  Lack (deficiency) of B vitamins. This can result from alcoholism, poor diet, or a restricted diet.  Infections.  Autoimmune diseases, such as rheumatoid arthritis and systemic lupus erythematosus.  Nerve diseases that are passed from parent to child (inherited).  Some medicines, such as cancer medicines (chemotherapy).  Poisonous (toxic) substances, such as lead and mercury.  Too little blood flowing to the legs.  Kidney disease.  Thyroid disease. In some cases, the cause of this condition is not known. What are the signs or symptoms? Symptoms of this condition depend on which of your nerves is damaged. Common symptoms include:  Loss of feeling (numbness) in the feet, hands, or both.  Tingling in the feet, hands, or both.  Burning pain.  Very sensitive skin.  Weakness.  Not being able to move a part of the body (paralysis).  Muscle twitching.  Clumsiness or poor coordination.  Loss of balance.  Not being able to control your bladder.  Feeling dizzy.  Sexual problems. How is this diagnosed? Diagnosing and finding the cause of peripheral  neuropathy can be difficult. Your health care provider will take your medical history and do a physical exam. A neurological exam will also be done. This involves checking things that are affected by your brain, spinal cord, and nerves (nervous system). For example, your health care provider will check your reflexes, how you move, and what you can feel. You may have other tests, such as:  Blood tests.  Electromyogram (EMG) and nerve conduction tests. These tests check nerve function and how well the nerves are controlling the muscles.  Imaging tests, such as CT scans or MRI to rule out other causes of your symptoms.  Removing a small piece of nerve to be examined in a lab (nerve biopsy).  Removing and examining a small amount of the fluid that surrounds the brain and spinal cord (lumbar puncture). How is this treated? Treatment for this condition may involve:  Treating the underlying cause of the neuropathy, such as diabetes, kidney disease, or vitamin deficiencies.  Stopping medicines that can cause neuropathy, such as chemotherapy.  Medicine to help relieve pain. Medicines may include: ? Prescription or over-the-counter pain medicine. ? Antiseizure medicine. ? Antidepressants. ? Pain-relieving patches that are applied to painful areas of skin.  Surgery to relieve pressure on a nerve or to destroy a nerve that is causing pain.  Physical therapy to help improve movement and balance.  Devices to help you move around (assistive devices). Follow these instructions at home: Medicines  Take over-the-counter and prescription medicines only as told by your health care provider. Do not take any other medicines without first asking your health care provider.  Do not drive or use heavy   machinery while taking prescription pain medicine. Lifestyle  Do not use any products that contain nicotine or tobacco, such as cigarettes and e-cigarettes. Smoking keeps blood from reaching damaged nerves.  If you need help quitting, ask your health care provider.  Avoid or limit alcohol. Too much alcohol can cause a vitamin B deficiency, and vitamin B is needed for healthy nerves.  Eat a healthy diet. This includes: ? Eating foods that are high in fiber, such as fresh fruits and vegetables, whole grains, and beans. ? Limiting foods that are high in fat and processed sugars, such as fried or sweet foods.   General instructions  If you have diabetes, work closely with your health care provider to keep your blood sugar under control.  If you have numbness in your feet: ? Check every day for signs of injury or infection. Watch for redness, warmth, and swelling. ? Wear padded socks and comfortable shoes. These help protect your feet.  Develop a good support system. Living with peripheral neuropathy can be stressful. Consider talking with a mental health specialist or joining a support group.  Use assistive devices and attend physical therapy as told by your health care provider. This may include using a walker or a cane.  Keep all follow-up visits as told by your health care provider. This is important.   Contact a health care provider if:  You have new signs or symptoms of peripheral neuropathy.  You are struggling emotionally from dealing with peripheral neuropathy.  Your pain is not well-controlled. Get help right away if:  You have an injury or infection that is not healing normally.  You develop new weakness in an arm or leg.  You have fallen or do so frequently. Summary  Peripheral neuropathy is when the nerves in the arms, or legs are damaged, resulting in numbness, weakness, or pain.  There are many causes of peripheral neuropathy, including diabetes, pinched nerves, vitamin deficiencies, autoimmune disease, and hereditary conditions.  Diagnosing and finding the cause of peripheral neuropathy can be difficult. Your health care provider will take your medical history, do a  physical exam, and do tests, including blood tests and nerve function tests.  Treatment involves treating the underlying cause of the neuropathy and taking medicines to help control pain. Physical therapy and assistive devices may also help. This information is not intended to replace advice given to you by your health care provider. Make sure you discuss any questions you have with your health care provider. Document Revised: 09/30/2019 Document Reviewed: 09/30/2019 Elsevier Patient Education  2021 Elsevier Inc.  

## 2020-03-04 NOTE — Progress Notes (Addendum)
There were no vitals taken for this visit.   Subjective:    Patient ID: Darius Williams, male    DOB: 09-05-1945, 75 y.o.   MRN: [id number]  HPI: Darius Williams is a 75 y.o. male  Chief Complaint  Patient presents with  . Cough    Patient states he has been coughing since last Thursday, and head congestion. Coughing up yellow mucus.    UPPER RESPIRATORY TRACT INFECTION Duration: 1 week Worst symptom: cough, congestion Fever: no Cough: yes Shortness of breath: yes Wheezing: no Chest pain: no Chest tightness: no Chest congestion: no Nasal congestion: yes Runny nose: yes Post nasal drip: yes Sneezing: no Sore throat: no Swollen glands: no Sinus pressure: yes Headache: yes Face pain: yes Toothache: no Ear pain: no  Ear pressure: no  Eyes red/itching:no Eye drainage/crusting: no  Vomiting: no Rash: no Fatigue: yes Sick contacts: no Strep contacts: no  Context: better Recurrent sinusitis: no Relief with OTC cold/cough medications: no  Treatments attempted: cold/sinus, mucinex and anti-histamine   Neuropathy has not changed. It is not hurting He is not interested in using medicine, but he is concerned about blood flow. Has had a vascular evaluation. No other concerns.   Relevant past medical, surgical, family and social history reviewed and updated as indicated. Interim medical history since our last visit reviewed. Allergies and medications reviewed and updated.  Review of Systems  Constitutional: Positive for fatigue. Negative for activity change, appetite change, chills, diaphoresis, fever and unexpected weight change.  HENT: Positive for congestion, postnasal drip, rhinorrhea, sinus pressure and sinus pain. Negative for dental problem, drooling, ear discharge, ear pain, facial swelling, hearing loss, mouth sores, nosebleeds, sneezing, sore throat, tinnitus, trouble swallowing and voice change.   Eyes: Negative.   Respiratory: Positive for cough. Negative for  apnea, choking, chest tightness, shortness of breath, wheezing and stridor.   Cardiovascular: Negative.   Gastrointestinal: Negative.   Neurological: Negative.   Psychiatric/Behavioral: Negative.     Per HPI unless specifically indicated above     Objective:    There were no vitals taken for this visit.  Wt Readings from Last 3 Encounters:  12/09/19 191 lb (86.6 kg)  12/05/19 188 lb (85.3 kg)  09/05/19 195 lb (88.5 kg)    Physical Exam Vitals and nursing note reviewed.  Constitutional:      General: He is not in acute distress.    Appearance: Normal appearance. He is not ill-appearing, toxic-appearing or diaphoretic.  HENT:     Head: Normocephalic and atraumatic.     Right Ear: External ear normal.     Left Ear: External ear normal.     Nose: Nose normal.     Mouth/Throat:     Mouth: Mucous membranes are moist.     Pharynx: Oropharynx is clear.  Eyes:     General: No scleral icterus.       Right eye: No discharge.        Left eye: No discharge.     Conjunctiva/sclera: Conjunctivae normal.     Pupils: Pupils are equal, round, and reactive to light.  Pulmonary:     Effort: Pulmonary effort is normal. No respiratory distress.     Comments: Speaking in full sentences Musculoskeletal:        General: Normal range of motion.     Cervical back: Normal range of motion.  Skin:    Coloration: Skin is not jaundiced or pale.     Findings: No bruising, erythema, lesion  or rash.  Neurological:     Mental Status: He is alert and oriented to person, place, and time. Mental status is at baseline.  Psychiatric:        Mood and Affect: Mood normal.        Behavior: Behavior normal.        Thought Content: Thought content normal.        Judgment: Judgment normal.     Results for orders placed or performed in visit on 09/05/19  Microscopic Examination   Urine  Result Value Ref Range   WBC, UA None seen 0 - 5 /hpf   RBC 0-2 0 - 2 /hpf   Epithelial Cells (non renal) 0-10 0 - 10  /hpf   Bacteria, UA None seen None seen/Few  CBC with Differential/Platelet  Result Value Ref Range   WBC 4.9 3.4 - 10.8 x10E3/uL   RBC 4.15 4.14 - 5.80 x10E6/uL   Hemoglobin 13.6 13.0 - 17.7 g/dL   Hematocrit 40.3 37.5 - 51.0 %   MCV 97 79 - 97 fL   MCH 32.8 26.6 - 33.0 pg   MCHC 33.7 31.5 - 35.7 g/dL   RDW 13.1 11.6 - 15.4 %   Platelets 127 (L) 150 - 450 x10E3/uL   Neutrophils 66 Not Estab. %   Lymphs 19 Not Estab. %   Monocytes 11 Not Estab. %   Eos 3 Not Estab. %   Basos 1 Not Estab. %   Neutrophils Absolute 3.2 1.4 - 7.0 x10E3/uL   Lymphocytes Absolute 0.9 0.7 - 3.1 x10E3/uL   Monocytes Absolute 0.5 0.1 - 0.9 x10E3/uL   EOS (ABSOLUTE) 0.2 0.0 - 0.4 x10E3/uL   Basophils Absolute 0.0 0.0 - 0.2 x10E3/uL   Immature Granulocytes 0 Not Estab. %   Immature Grans (Abs) 0.0 0.0 - 0.1 x10E3/uL  Urinalysis, Routine w reflex microscopic  Result Value Ref Range   Specific Gravity, UA 1.025 1.005 - 1.030   pH, UA 6.0 5.0 - 7.5   Color, UA Yellow Yellow   Appearance Ur Clear Clear   Leukocytes,UA Negative Negative   Protein,UA Negative Negative/Trace   Glucose, UA Negative Negative   Ketones, UA Negative Negative   RBC, UA Trace (A) Negative   Bilirubin, UA Negative Negative   Urobilinogen, Ur 1.0 0.2 - 1.0 mg/dL   Nitrite, UA Negative Negative   Microscopic Examination See below:   Bayer DCA Hb A1c Waived  Result Value Ref Range   HB A1C (BAYER DCA - WAIVED) 6.1 <7.0 %  Comprehensive metabolic panel  Result Value Ref Range   Glucose 119 (H) 65 - 99 mg/dL   BUN 11 8 - 27 mg/dL   Creatinine, Ser 0.95 0.76 - 1.27 mg/dL   GFR calc non Af Amer 79 >59 mL/min/1.73   GFR calc Af Amer 91 >59 mL/min/1.73   BUN/Creatinine Ratio 12 10 - 24   Sodium 139 134 - 144 mmol/L   Potassium 4.7 3.5 - 5.2 mmol/L   Chloride 101 96 - 106 mmol/L   CO2 27 20 - 29 mmol/L   Calcium 9.0 8.6 - 10.2 mg/dL   Total Protein 6.8 6.0 - 8.5 g/dL   Albumin 4.3 3.7 - 4.7 g/dL   Globulin, Total 2.5 1.5 - 4.5  g/dL   Albumin/Globulin Ratio 1.7 1.2 - 2.2   Bilirubin Total 1.0 0.0 - 1.2 mg/dL   Alkaline Phosphatase 69 48 - 121 IU/L   AST 24 0 - 40 IU/L   ALT 22  0 - 44 IU/L  Lipid Panel w/o Chol/HDL Ratio  Result Value Ref Range   Cholesterol, Total 172 100 - 199 mg/dL   Triglycerides 100 0 - 149 mg/dL   HDL 41 >39 mg/dL   VLDL Cholesterol Cal 18 5 - 40 mg/dL   LDL Chol Calc (NIH) 113 (H) 0 - 99 mg/dL  Ehrlichia antibody panel  Result Value Ref Range   E.Chaffeensis (HME) IgG Negative Neg:<1:64   E. Chaffeensis ([facility]ME) IgM Titer Negative Neg:<1:20   HGE IgG Titer Negative Neg:<1:64   HGE IgM Titer Negative Neg:<1:20  Babesia microti Antibody Panel  Result Value Ref Range   Babesia microti IgM <1:10 Neg:<1:10   Babesia microti IgG <1:10 Neg:<1:10  Rocky mtn spotted fvr abs pnl(IgG+IgM)  Result Value Ref Range   RMSF IgG Negative Negative   RMSF IgM 0.28 0.00 - 0.89 index  Lyme Ab/Western Blot Reflex  Result Value Ref Range   Lyme IgG/IgM Ab <0.91 0.00 - 0.90 ISR   LYME DISEASE AB, QUANT, IGM <0.80 0.00 - 0.79 index  TSH  Result Value Ref Range   TSH 1.580 0.450 - 4.500 uIU/mL      Assessment & Plan:   Problem List Items Addressed This Visit      Nervous and Auditory   Neuropathy    Discussed referral to neurology vs medication. He would like to avoid meds at this time. Continue to monitor.        Other Visit Diagnoses    Acute non-recurrent maxillary sinusitis    -  Primary   WIll treat with prednisone, tussionex and doxycycline. Call if not getting better or getting worse. Call with any concerns.    Relevant Medications   predniSONE (DELTASONE) 50 MG tablet   doxycycline (VIBRA-TABS) 100 MG tablet   chlorpheniramine-HYDROcodone (TUSSIONEX PENNKINETIC ER) 10-8 MG/5ML SUER       Follow up plan: Return if symptoms worsen or fail to improve.    . This visit was completed via MyChart due to the restrictions of the COVID-19 pandemic. All issues as above were discussed and  addressed. Physical exam was done as above through visual confirmation on MyChart. If it was felt that the patient should be evaluated in the office, they were directed there. The patient verbally consented to this visit. . Location of the patient: home . Location of the provider: work . Those involved with this call:  . Provider: Park Liter, DO . CMA: Louanna Raw, [place] . Front Desk/Registration: Jill Side  . Time spent on call: 15 minutes with patient face to face via video conference. More than 50% of this time was spent in counseling and coordination of care. 23 minutes total spent in review of patient's record and preparation of their chart.

## 2020-03-05 ENCOUNTER — Encounter: Payer: Self-pay | Admitting: Family Medicine

## 2020-03-05 NOTE — Assessment & Plan Note (Signed)
Discussed referral to neurology vs medication. He would like to avoid meds at this time. Continue to monitor.

## 2020-07-15 DIAGNOSIS — I2510 Atherosclerotic heart disease of native coronary artery without angina pectoris: Secondary | ICD-10-CM | POA: Diagnosis not present

## 2020-07-15 DIAGNOSIS — R5383 Other fatigue: Secondary | ICD-10-CM | POA: Diagnosis not present

## 2020-07-15 DIAGNOSIS — R42 Dizziness and giddiness: Secondary | ICD-10-CM | POA: Diagnosis not present

## 2020-07-15 DIAGNOSIS — E7800 Pure hypercholesterolemia, unspecified: Secondary | ICD-10-CM | POA: Diagnosis not present

## 2020-07-15 DIAGNOSIS — I77810 Thoracic aortic ectasia: Secondary | ICD-10-CM | POA: Diagnosis not present

## 2020-08-30 DIAGNOSIS — N401 Enlarged prostate with lower urinary tract symptoms: Secondary | ICD-10-CM | POA: Diagnosis not present

## 2020-08-30 DIAGNOSIS — N5201 Erectile dysfunction due to arterial insufficiency: Secondary | ICD-10-CM | POA: Diagnosis not present

## 2020-09-07 ENCOUNTER — Encounter: Payer: Medicare Other | Admitting: Family Medicine

## 2020-10-04 ENCOUNTER — Ambulatory Visit (INDEPENDENT_AMBULATORY_CARE_PROVIDER_SITE_OTHER): Payer: Medicare Other | Admitting: Family Medicine

## 2020-10-04 ENCOUNTER — Other Ambulatory Visit: Payer: Self-pay

## 2020-10-04 ENCOUNTER — Encounter: Payer: Self-pay | Admitting: Family Medicine

## 2020-10-04 VITALS — BP 96/64 | HR 60 | Temp 97.6°F | Resp 16 | Ht 71.5 in | Wt 187.0 lb

## 2020-10-04 DIAGNOSIS — Z0000 Encounter for general adult medical examination without abnormal findings: Secondary | ICD-10-CM

## 2020-10-04 DIAGNOSIS — Z23 Encounter for immunization: Secondary | ICD-10-CM | POA: Diagnosis not present

## 2020-10-04 DIAGNOSIS — G629 Polyneuropathy, unspecified: Secondary | ICD-10-CM

## 2020-10-04 DIAGNOSIS — I77810 Thoracic aortic ectasia: Secondary | ICD-10-CM | POA: Diagnosis not present

## 2020-10-04 DIAGNOSIS — R7301 Impaired fasting glucose: Secondary | ICD-10-CM | POA: Diagnosis not present

## 2020-10-04 DIAGNOSIS — R5382 Chronic fatigue, unspecified: Secondary | ICD-10-CM

## 2020-10-04 DIAGNOSIS — M5431 Sciatica, right side: Secondary | ICD-10-CM | POA: Diagnosis not present

## 2020-10-04 DIAGNOSIS — H00016 Hordeolum externum left eye, unspecified eyelid: Secondary | ICD-10-CM | POA: Diagnosis not present

## 2020-10-04 DIAGNOSIS — E7800 Pure hypercholesterolemia, unspecified: Secondary | ICD-10-CM | POA: Diagnosis not present

## 2020-10-04 DIAGNOSIS — H6123 Impacted cerumen, bilateral: Secondary | ICD-10-CM

## 2020-10-04 DIAGNOSIS — N400 Enlarged prostate without lower urinary tract symptoms: Secondary | ICD-10-CM

## 2020-10-04 LAB — URINALYSIS, ROUTINE W REFLEX MICROSCOPIC
Bilirubin, UA: NEGATIVE
Glucose, UA: NEGATIVE
Ketones, UA: NEGATIVE
Leukocytes,UA: NEGATIVE
Nitrite, UA: NEGATIVE
Protein,UA: NEGATIVE
Specific Gravity, UA: 1.02 (ref 1.005–1.030)
Urobilinogen, Ur: 0.2 mg/dL (ref 0.2–1.0)
pH, UA: 5 (ref 5.0–7.5)

## 2020-10-04 LAB — MICROSCOPIC EXAMINATION: Bacteria, UA: NONE SEEN

## 2020-10-04 LAB — BAYER DCA HB A1C WAIVED: HB A1C (BAYER DCA - WAIVED): 6 % — ABNORMAL HIGH (ref 4.8–5.6)

## 2020-10-04 MED ORDER — ERYTHROMYCIN 5 MG/GM OP OINT: 1.0000 "application " | TOPICAL_OINTMENT | Freq: Three times a day (TID) | OPHTHALMIC | 0 refills | Status: DC

## 2020-10-04 NOTE — Assessment & Plan Note (Signed)
Following with cardiology resistant to statin. Checking labs today. Encouraged starting a statin- he will consider it.

## 2020-10-04 NOTE — Patient Instructions (Signed)
Preventative Services:  Health Risk Assessment and Personalized Prevention Plan: Done today Bone Mass Measurements: N/A CVD Screening: Done today Colon Cancer Screening: up to date Depression Screening: Done today Diabetes Screening: Done today Glaucoma Screening: See your eye doctor Hepatitis B vaccine: N/A Hepatitis C screening: Up to date HIV Screening: Up to date Flu Vaccine: Done today Lung cancer Screening: N/A Obesity Screening: Done today Pneumonia Vaccines (2): up to date STI Screening: N/A PSA screening: up to date

## 2020-10-04 NOTE — Assessment & Plan Note (Signed)
Checking labs today. Await results. Treat as needed.  

## 2020-10-04 NOTE — Progress Notes (Signed)
BP 96/64 (BP Location: Left Arm, Patient Position: Sitting, Cuff Size: Large)   Pulse 60   Temp 97.6 F (36.4 C) (Oral)   Resp 16   Ht 5' 11.5" (1.816 m)   Wt 187 lb (84.8 kg)   SpO2 97%   BMI 25.72 kg/m    Subjective:    Patient ID: Darius Williams, male    DOB: August 26, 1945, 75 y.o.   MRN: [id number]  HPI: Darius Williams is a 75 y.o. male presenting on 10/04/2020 for comprehensive medical examination. Current medical complaints include:  Bottom if his feet are still numb. Has been having some sciatica off and on for the past 2-3 months. Has been having some styes in his eyes. He has been using some warm compresses and it doesn't seem to be helping   He currently lives with: wife Interim Problems from his last visit: no  Functional Status Survey: Is the patient deaf or have difficulty hearing?: No Does the patient have difficulty seeing, even when wearing glasses/contacts?: No Does the patient have difficulty concentrating, remembering, or making decisions?: No Does the patient have difficulty walking or climbing stairs?: No Does the patient have difficulty dressing or bathing?: No Does the patient have difficulty doing errands alone such as visiting a doctor's office or shopping?: No  FALL RISK: Fall Risk  10/04/2020 12/05/2019 09/05/2019 11/27/2018 07/19/2018  Falls in the past year? 1 0 1 0 0  Comment - - - Emmi Telephone Survey: data to providers prior to load -  Number falls in past yr: 0 0 0 - 0  Injury with Fall? 0 0 0 - 1  Risk for fall due to : No Fall Risks - - - -  Follow up Falls evaluation completed Falls evaluation completed - - Falls evaluation completed    Depression Screen Depression screen [facility] 2/9 10/04/2020 12/05/2019 09/05/2019 07/19/2018 03/22/2017  Decreased Interest 0 0 0 0 0  Down, Depressed, Hopeless 0 0 0 0 0  PHQ - 2 Score 0 0 0 0 0  Altered sleeping 1 - - 0 -  Tired, decreased energy 1 - - 0 -  Change in appetite 0 - - 0 -  Feeling bad or failure about  yourself  0 - - 0 -  Trouble concentrating 0 - - 0 -  Moving slowly or fidgety/restless 0 - - 0 -  Suicidal thoughts 0 - - 0 -  PHQ-9 Score 2 - - 0 -  Difficult doing work/chores Not difficult at all - - Not difficult at all -    Advanced Directives Does not have one  Past Medical History:  Past Medical History:  Diagnosis Date   Actinic keratosis 05/01/2013   mid forehead - bx proven, with HPV related changes   BPH (benign prostatic hyperplasia) 03/18/2015   Cancer ([facility])    SKIN CANCER FOREHEAD   Fatigue 03/18/2015   History of kidney stones    H/O   Hypercholesteremia 03/18/2015   Personal history of kidney stones 07/15/2014   Tinnitus 06/13/2017    Surgical History:  Past Surgical History:  Procedure Laterality Date   CHOLECYSTECTOMY     CYSTOSCOPY WITH LITHOLAPAXY N/A 10/30/2017   Procedure: CYSTOSCOPY WITH LITHOLAPAXY;  Surgeon: Royston Cowper, MD;  Location: [facility];  Service: Urology;  Laterality: N/A;   GREEN LIGHT LASER TURP (TRANSURETHRAL RESECTION OF PROSTATE N/A 10/30/2017   Procedure: GREEN LIGHT LASER TURP (TRANSURETHRAL RESECTION OF PROSTATE;  Surgeon: Royston Cowper, MD;  Location: [facility];  Service: Urology;  Laterality: N/A;   HOLMIUM LASER APPLICATION N/A 51/76/1607   Procedure: HOLMIUM LASER APPLICATION;  Surgeon: Royston Cowper, MD;  Location: [facility];  Service: Urology;  Laterality: N/A;   LITHOTRIPSY  09/12/2007   LITHOTRIPSY  11/07/2007    Medications:  Current Outpatient Medications on File Prior to Visit  Medication Sig   pyridoxine (B-6) 250 MG tablet Take 250 mg by mouth daily.   No current facility-administered medications on file prior to visit.    Allergies:  Allergies  Allergen Reactions   Other     TRISCUITS-RASH   Penicillin G Benzathine Rash    Has patient had a PCN reaction causing immediate rash, facial/tongue/throat swelling, SOB or lightheadedness with hypotension: Yes Has patient had a PCN reaction causing severe rash  involving mucus membranes or skin necrosis: No Has patient had a PCN reaction that required hospitalization: No Has patient had a PCN reaction occurring within the last 10 years: No If all of the above answers are "NO", then may proceed with Cephalosporin use.     Social History:  Social History   Socioeconomic History   Marital status: Married    Spouse name: Not on file   Number of children: Not on file   Years of education: Not on file   Highest education level: Not on file  Occupational History   Not on file  Tobacco Use   Smoking status: Never   Smokeless tobacco: Never  Vaping Use   Vaping Use: Never used  Substance and Sexual Activity   Alcohol use: No    Alcohol/week: 0.0 standard drinks   Drug use: No   Sexual activity: Never  Other Topics Concern   Not on file  Social History Narrative   Not on file   Social Determinants of Health   Financial Resource Strain: Not on file  Food Insecurity: Not on file  Transportation Needs: Not on file  Physical Activity: Not on file  Stress: Not on file  Social Connections: Not on file  Intimate Partner Violence: Not on file   Social History   Tobacco Use  Smoking Status Never  Smokeless Tobacco Never   Social History   Substance and Sexual Activity  Alcohol Use No   Alcohol/week: 0.0 standard drinks    Family History:  Family History  Problem Relation Age of Onset   Cancer Mother        lung   Cancer Sister        breast   Cancer Father        prostate and skin   Varicose Veins Son    Cancer Paternal Grandmother        brain    Past medical history, surgical history, medications, allergies, family history and social history reviewed with patient today and changes made to appropriate areas of the chart.   Review of Systems  Constitutional: Negative.   HENT: Negative.    Eyes: Negative.   Respiratory: Negative.    Cardiovascular: Negative.   Gastrointestinal: Negative.   Genitourinary: Negative.    Musculoskeletal: Negative.   Skin: Negative.   Neurological: Negative.   Endo/Heme/Allergies: Negative.   Psychiatric/Behavioral: Negative.    All other ROS negative except what is listed above and in the HPI.      Objective:    BP 96/64 (BP Location: Left Arm, Patient Position: Sitting, Cuff Size: Large)   Pulse 60   Temp 97.6 F (36.4 C) (Oral)  Resp 16   Ht 5' 11.5" (1.816 m)   Wt 187 lb (84.8 kg)   SpO2 97%   BMI 25.72 kg/m   Wt Readings from Last 3 Encounters:  10/04/20 187 lb (84.8 kg)  12/09/19 191 lb (86.6 kg)  12/05/19 188 lb (85.3 kg)    Physical Exam Vitals and nursing note reviewed.  Constitutional:      General: He is not in acute distress.    Appearance: Normal appearance. He is normal weight. He is not ill-appearing, toxic-appearing or diaphoretic.  HENT:     Head: Normocephalic and atraumatic.     Right Ear: Tympanic membrane, ear canal and external ear normal. There is impacted cerumen.     Left Ear: Tympanic membrane, ear canal and external ear normal. There is impacted cerumen.     Nose: Nose normal. No congestion or rhinorrhea.     Mouth/Throat:     Mouth: Mucous membranes are moist.     Pharynx: Oropharynx is clear. No oropharyngeal exudate or posterior oropharyngeal erythema.  Eyes:     General: No scleral icterus.       Right eye: No discharge.        Left eye: No discharge.     Extraocular Movements: Extraocular movements intact.     Conjunctiva/sclera: Conjunctivae normal.     Pupils: Pupils are equal, round, and reactive to light.  Neck:     Vascular: No carotid bruit.  Cardiovascular:     Rate and Rhythm: Normal rate and regular rhythm.     Pulses: Normal pulses.     Heart sounds: No murmur heard.   No friction rub. No gallop.  Pulmonary:     Effort: Pulmonary effort is normal. No respiratory distress.     Breath sounds: Normal breath sounds. No stridor. No wheezing, rhonchi or rales.  Chest:     Chest wall: No tenderness.   Abdominal:     General: Abdomen is flat. Bowel sounds are normal. There is no distension.     Palpations: Abdomen is soft. There is no mass.     Tenderness: There is no abdominal tenderness. There is no right CVA tenderness, left CVA tenderness, guarding or rebound.     Hernia: No hernia is present.  Genitourinary:    Comments: Genital exam deferred with shared decision making Musculoskeletal:        General: No swelling, tenderness, deformity or signs of injury.     Cervical back: Normal range of motion and neck supple. No rigidity. No muscular tenderness.     Right lower leg: No edema.     Left lower leg: No edema.  Lymphadenopathy:     Cervical: No cervical adenopathy.  Skin:    General: Skin is warm and dry.     Capillary Refill: Capillary refill takes less than 2 seconds.     Coloration: Skin is not jaundiced or pale.     Findings: No bruising, erythema, lesion or rash.  Neurological:     General: No focal deficit present.     Mental Status: He is alert and oriented to person, place, and time.     Cranial Nerves: No cranial nerve deficit.     Sensory: No sensory deficit.     Motor: No weakness.     Coordination: Coordination normal.     Gait: Gait normal.     Deep Tendon Reflexes: Reflexes normal.  Psychiatric:        Mood and Affect: Mood normal.  Behavior: Behavior normal.        Thought Content: Thought content normal.        Judgment: Judgment normal.    6CIT Screen 10/04/2020 09/05/2019 07/19/2018 03/22/2017  What Year? 0 points 0 points 0 points 0 points  What month? 0 points 0 points 0 points 0 points  What time? 0 points 0 points 0 points 0 points  Count back from 20 0 points 0 points 0 points 0 points  Months in reverse 2 points 2 points 0 points 0 points  Repeat phrase 0 points 2 points 2 points 0 points  Total Score 2 4 2  0    Results for orders placed or performed in visit on 09/05/19  Microscopic Examination   Urine  Result Value Ref Range   WBC,  UA None seen 0 - 5 /hpf   RBC 0-2 0 - 2 /hpf   Epithelial Cells (non renal) 0-10 0 - 10 /hpf   Bacteria, UA None seen None seen/Few  CBC with Differential/Platelet  Result Value Ref Range   WBC 4.9 3.4 - 10.8 x10E3/uL   RBC 4.15 4.14 - 5.80 x10E6/uL   Hemoglobin 13.6 13.0 - 17.7 g/dL   Hematocrit 40.3 37.5 - 51.0 %   MCV 97 79 - 97 fL   MCH 32.8 26.6 - 33.0 pg   MCHC 33.7 31.5 - 35.7 g/dL   RDW 13.1 11.6 - 15.4 %   Platelets 127 (L) 150 - 450 x10E3/uL   Neutrophils 66 Not Estab. %   Lymphs 19 Not Estab. %   Monocytes 11 Not Estab. %   Eos 3 Not Estab. %   Basos 1 Not Estab. %   Neutrophils Absolute 3.2 1.4 - 7.0 x10E3/uL   Lymphocytes Absolute 0.9 0.7 - 3.1 x10E3/uL   Monocytes Absolute 0.5 0.1 - 0.9 x10E3/uL   EOS (ABSOLUTE) 0.2 0.0 - 0.4 x10E3/uL   Basophils Absolute 0.0 0.0 - 0.2 x10E3/uL   Immature Granulocytes 0 Not Estab. %   Immature Grans (Abs) 0.0 0.0 - 0.1 x10E3/uL  Urinalysis, Routine w reflex microscopic  Result Value Ref Range   Specific Gravity, UA 1.025 1.005 - 1.030   pH, UA 6.0 5.0 - 7.5   Color, UA Yellow Yellow   Appearance Ur Clear Clear   Leukocytes,UA Negative Negative   Protein,UA Negative Negative/Trace   Glucose, UA Negative Negative   Ketones, UA Negative Negative   RBC, UA Trace (A) Negative   Bilirubin, UA Negative Negative   Urobilinogen, Ur 1.0 0.2 - 1.0 mg/dL   Nitrite, UA Negative Negative   Microscopic Examination See below:   Bayer DCA Hb A1c Waived  Result Value Ref Range   HB A1C (BAYER DCA - WAIVED) 6.1 <7.0 %  Comprehensive metabolic panel  Result Value Ref Range   Glucose 119 (H) 65 - 99 mg/dL   BUN 11 8 - 27 mg/dL   Creatinine, Ser 0.95 0.76 - 1.27 mg/dL   GFR calc non Af Amer 79 >59 mL/min/1.73   GFR calc Af Amer 91 >59 mL/min/1.73   BUN/Creatinine Ratio 12 10 - 24   Sodium 139 134 - 144 mmol/L   Potassium 4.7 3.5 - 5.2 mmol/L   Chloride 101 96 - 106 mmol/L   CO2 27 20 - 29 mmol/L   Calcium 9.0 8.6 - 10.2 mg/dL   Total  Protein 6.8 6.0 - 8.5 g/dL   Albumin 4.3 3.7 - 4.7 g/dL   Globulin, Total 2.5 1.5 - 4.5 g/dL  Albumin/Globulin Ratio 1.7 1.2 - 2.2   Bilirubin Total 1.0 0.0 - 1.2 mg/dL   Alkaline Phosphatase 69 48 - 121 IU/L   AST 24 0 - 40 IU/L   ALT 22 0 - 44 IU/L  Lipid Panel w/o Chol/HDL Ratio  Result Value Ref Range   Cholesterol, Total 172 100 - 199 mg/dL   Triglycerides 100 0 - 149 mg/dL   HDL 41 >39 mg/dL   VLDL Cholesterol Cal 18 5 - 40 mg/dL   LDL Chol Calc (NIH) 113 (H) 0 - 99 mg/dL  Ehrlichia antibody panel  Result Value Ref Range   E.Chaffeensis (HME) IgG Negative Neg:<1:64   E. Chaffeensis (HME) IgM Titer Negative Neg:<1:20   HGE IgG Titer Negative Neg:<1:64   HGE IgM Titer Negative Neg:<1:20  Babesia microti Antibody Panel  Result Value Ref Range   Babesia microti IgM <1:10 Neg:<1:10   Babesia microti IgG <1:10 Neg:<1:10  Rocky mtn spotted fvr abs pnl(IgG+IgM)  Result Value Ref Range   RMSF IgG Negative Negative   RMSF IgM 0.28 0.00 - 0.89 index  Lyme Ab/Western Blot Reflex  Result Value Ref Range   Lyme IgG/IgM Ab <0.91 0.00 - 0.90 ISR   LYME DISEASE AB, QUANT, IGM <0.80 0.00 - 0.79 index  TSH  Result Value Ref Range   TSH 1.580 0.450 - 4.500 uIU/mL      Assessment & Plan:   Problem List Items Addressed This Visit       Cardiovascular and Mediastinum   Dilated aortic root (HCC)    Following with cardiology resistant to statin. Checking labs today. Encouraged starting a statin- he will consider it.         Endocrine   IFG (impaired fasting glucose)    Rechecking labs today. Await results. Treat as needed.       Relevant Orders   CBC with Differential/Platelet   Bayer DCA Hb A1c Waived     Nervous and Auditory   Neuropathy    Declines medication. Would like to see neurology. Referral generated today. Call with any concerns.       Relevant Orders   CBC with Differential/Platelet   Ambulatory referral to Neurology     Genitourinary   BPH (benign  prostatic hyperplasia)    Follows with urology. Stable. Continue to monitor.       Relevant Orders   CBC with Differential/Platelet   Urinalysis, Routine w reflex microscopic     Other   Fatigue    Checking labs today. Await results. Treat as needed.       Relevant Orders   TSH   Hypercholesteremia    Declines statin. Discussed starting one- he will consider. Labs drawn today. Await results.       Relevant Orders   Comprehensive metabolic panel   Lipid Panel w/o Chol/HDL Ratio   Other Visit Diagnoses     Encounter for Medicare annual wellness exam    -  Primary   Preventative care discussed today as below. Call with any concerns.    Hordeolum externum of left eye, unspecified eyelid       Will treat with erythromycin. Continue warm compresses. Call with any concerns.    Sciatica of right side       Will start stretches. Will check in in 2 weeks, if not improving consider x-ray and PT.    Bilateral impacted cerumen       Ears flushed today with good results.    Need for influenza  vaccination       Flu shot given today.   Relevant Orders   Flu Vaccine QUAD High Dose(Fluad) (Completed)        Preventative Services:  Health Risk Assessment and Personalized Prevention Plan: Done today Bone Mass Measurements: N/A CVD Screening: Done today Colon Cancer Screening: up to date Depression Screening: Done today Diabetes Screening: Done today Glaucoma Screening: See your eye doctor Hepatitis B vaccine: N/A Hepatitis C screening: Up to date HIV Screening: Up to date Flu Vaccine: Done today Lung cancer Screening: N/A Obesity Screening: Done today Pneumonia Vaccines (2): up to date STI Screening: N/A PSA screening: up to date  Discussed aspirin prophylaxis for myocardial infarction prevention and decision was made to continue ASA  LABORATORY TESTING:  Health maintenance labs ordered today as discussed above.   IMMUNIZATIONS:   - Tdap: Tetanus vaccination status  reviewed: last tetanus booster within 10 years. - Influenza: Administered today - Pneumovax: Up to date - Prevnar: Up to date - Shingrix vaccine: Given elsewhere  SCREENING: - Colonoscopy: Up to date  Discussed with patient purpose of the colonoscopy is to detect colon cancer at curable precancerous or early stages   PATIENT COUNSELING:    Sexuality: Discussed sexually transmitted diseases, partner selection, use of condoms, avoidance of unintended pregnancy  and contraceptive alternatives.   Advised to avoid cigarette smoking.  I discussed with the patient that most people either abstain from alcohol or drink within safe limits (<=14/week and <=4 drinks/occasion for males, <=7/weeks and <= 3 drinks/occasion for females) and that the risk for alcohol disorders and other health effects rises proportionally with the number of drinks per week and how often a drinker exceeds daily limits.  Discussed cessation/primary prevention of drug use and availability of treatment for abuse.   Diet: Encouraged to adjust caloric intake to maintain  or achieve ideal body weight, to reduce intake of dietary saturated fat and total fat, to limit sodium intake by avoiding high sodium foods and not adding table salt, and to maintain adequate dietary potassium and calcium preferably from fresh fruits, vegetables, and low-fat dairy products.    stressed the importance of regular exercise  Injury prevention: Discussed safety belts, safety helmets, smoke detector, smoking near bedding or upholstery.   Dental health: Discussed importance of regular tooth brushing, flossing, and dental visits.   Follow up plan: NEXT PREVENTATIVE PHYSICAL DUE IN 1 YEAR. Return in about 1 year (around 10/04/2021), or wellness.

## 2020-10-04 NOTE — Assessment & Plan Note (Signed)
Follows with urology. Stable. Continue to monitor.

## 2020-10-04 NOTE — Assessment & Plan Note (Signed)
Declines statin. Discussed starting one- he will consider. Labs drawn today. Await results.

## 2020-10-04 NOTE — Assessment & Plan Note (Signed)
Declines medication. Would like to see neurology. Referral generated today. Call with any concerns.

## 2020-10-04 NOTE — Assessment & Plan Note (Signed)
Rechecking labs today. Await results. Treat as needed.  °

## 2020-10-05 LAB — CBC WITH DIFFERENTIAL/PLATELET
Basophils Absolute: 0 10*3/uL (ref 0.0–0.2)
Basos: 1 %
EOS (ABSOLUTE): 0.1 10*3/uL (ref 0.0–0.4)
Eos: 3 %
Hematocrit: 42.4 % (ref 37.5–51.0)
Hemoglobin: 14.3 g/dL (ref 13.0–17.7)
Immature Grans (Abs): 0 10*3/uL (ref 0.0–0.1)
Immature Granulocytes: 0 %
Lymphocytes Absolute: 1.4 10*3/uL (ref 0.7–3.1)
Lymphs: 29 %
MCH: 32.3 pg (ref 26.6–33.0)
MCHC: 33.7 g/dL (ref 31.5–35.7)
MCV: 96 fL (ref 79–97)
Monocytes Absolute: 0.5 10*3/uL (ref 0.1–0.9)
Monocytes: 10 %
Neutrophils Absolute: 2.8 10*3/uL (ref 1.4–7.0)
Neutrophils: 57 %
Platelets: 123 10*3/uL — ABNORMAL LOW (ref 150–450)
RBC: 4.43 x10E6/uL (ref 4.14–5.80)
RDW: 12.4 % (ref 11.6–15.4)
WBC: 4.8 10*3/uL (ref 3.4–10.8)

## 2020-10-05 LAB — COMPREHENSIVE METABOLIC PANEL
ALT: 25 IU/L (ref 0–44)
AST: 26 IU/L (ref 0–40)
Albumin/Globulin Ratio: 2.2 (ref 1.2–2.2)
Albumin: 4.8 g/dL — ABNORMAL HIGH (ref 3.7–4.7)
Alkaline Phosphatase: 79 IU/L (ref 44–121)
BUN/Creatinine Ratio: 14 (ref 10–24)
BUN: 12 mg/dL (ref 8–27)
Bilirubin Total: 0.8 mg/dL (ref 0.0–1.2)
CO2: 25 mmol/L (ref 20–29)
Calcium: 9.2 mg/dL (ref 8.6–10.2)
Chloride: 99 mmol/L (ref 96–106)
Creatinine, Ser: 0.84 mg/dL (ref 0.76–1.27)
Globulin, Total: 2.2 g/dL (ref 1.5–4.5)
Glucose: 92 mg/dL (ref 70–99)
Potassium: 4 mmol/L (ref 3.5–5.2)
Sodium: 138 mmol/L (ref 134–144)
Total Protein: 7 g/dL (ref 6.0–8.5)
eGFR: 91 mL/min/{1.73_m2} (ref >59)

## 2020-10-05 LAB — TSH: TSH: 1.6 u[IU]/mL (ref 0.450–4.500)

## 2020-10-05 LAB — LIPID PANEL W/O CHOL/HDL RATIO
Cholesterol, Total: 185 mg/dL (ref 100–199)
HDL: 48 mg/dL (ref >39)
LDL Chol Calc (NIH): 117 mg/dL — ABNORMAL HIGH (ref 0–99)
Triglycerides: 110 mg/dL (ref 0–149)
VLDL Cholesterol Cal: 20 mg/dL (ref 5–40)

## 2020-10-18 ENCOUNTER — Telehealth: Payer: Self-pay | Admitting: Family Medicine

## 2020-10-18 NOTE — Telephone Encounter (Signed)
Can we please check to see if his sciatica is better? If not we can get an x-ray and start some PT if he'd like .

## 2020-10-18 NOTE — Telephone Encounter (Signed)
-----   Message from Valerie Roys, DO sent at 10/04/2020  1:23 PM EDT ----- Call to check in on his sciatica

## 2020-10-18 NOTE — Telephone Encounter (Signed)
Patient says he is doing better and that he loves Dr Wynetta Emery.

## 2020-11-04 ENCOUNTER — Encounter: Payer: PRIVATE HEALTH INSURANCE | Admitting: Dermatology

## 2021-01-05 DIAGNOSIS — E559 Vitamin D deficiency, unspecified: Secondary | ICD-10-CM | POA: Diagnosis not present

## 2021-01-05 DIAGNOSIS — R200 Anesthesia of skin: Secondary | ICD-10-CM | POA: Diagnosis not present

## 2021-01-13 DIAGNOSIS — H2513 Age-related nuclear cataract, bilateral: Secondary | ICD-10-CM | POA: Diagnosis not present

## 2021-01-20 DIAGNOSIS — I77810 Thoracic aortic ectasia: Secondary | ICD-10-CM | POA: Diagnosis not present

## 2021-01-20 DIAGNOSIS — E7800 Pure hypercholesterolemia, unspecified: Secondary | ICD-10-CM | POA: Diagnosis not present

## 2021-01-20 DIAGNOSIS — I2510 Atherosclerotic heart disease of native coronary artery without angina pectoris: Secondary | ICD-10-CM | POA: Diagnosis not present

## 2021-01-27 DIAGNOSIS — Q2549 Other congenital malformations of aorta: Secondary | ICD-10-CM | POA: Diagnosis not present

## 2021-01-27 DIAGNOSIS — I77810 Thoracic aortic ectasia: Secondary | ICD-10-CM | POA: Diagnosis not present

## 2021-02-10 ENCOUNTER — Ambulatory Visit: Payer: BC Managed Care – PPO | Admitting: Dermatology

## 2021-02-10 ENCOUNTER — Other Ambulatory Visit: Payer: Self-pay

## 2021-02-10 DIAGNOSIS — L821 Other seborrheic keratosis: Secondary | ICD-10-CM

## 2021-02-10 DIAGNOSIS — L820 Inflamed seborrheic keratosis: Secondary | ICD-10-CM | POA: Diagnosis not present

## 2021-02-10 DIAGNOSIS — L309 Dermatitis, unspecified: Secondary | ICD-10-CM

## 2021-02-10 DIAGNOSIS — L570 Actinic keratosis: Secondary | ICD-10-CM

## 2021-02-10 DIAGNOSIS — L578 Other skin changes due to chronic exposure to nonionizing radiation: Secondary | ICD-10-CM | POA: Diagnosis not present

## 2021-02-10 DIAGNOSIS — D229 Melanocytic nevi, unspecified: Secondary | ICD-10-CM

## 2021-02-10 DIAGNOSIS — Z1283 Encounter for screening for malignant neoplasm of skin: Secondary | ICD-10-CM | POA: Diagnosis not present

## 2021-02-10 DIAGNOSIS — D1800 Hemangioma unspecified site: Secondary | ICD-10-CM

## 2021-02-10 DIAGNOSIS — Z85828 Personal history of other malignant neoplasm of skin: Secondary | ICD-10-CM

## 2021-02-10 DIAGNOSIS — L814 Other melanin hyperpigmentation: Secondary | ICD-10-CM

## 2021-02-10 MED ORDER — MOMETASONE FUROATE 0.1 % EX CREA: 1.0000 "application " | TOPICAL_CREAM | Freq: Every day | CUTANEOUS | 3 refills | Status: DC | PRN

## 2021-02-10 NOTE — Progress Notes (Signed)
Follow-Up Visit   Subjective  Darius Williams is a 76 y.o. male who presents for the following: Annual Exam (History of skin cancer of forehead - TBSE today). The patient presents for Total-Body Skin Exam (TBSE) for skin cancer screening and mole check.  The patient has spots, moles and lesions to be evaluated, some may be new or changing and the patient has concerns that these could be cancer.  The following portions of the chart were reviewed this encounter and updated as appropriate:   Tobacco   Allergies   Meds   Problems   Med Hx   Surg Hx   Fam Hx      Review of Systems:  No other skin or systemic complaints except as noted in HPI or Assessment and Plan.  Objective  Well appearing patient in no apparent distress; mood and affect are within normal limits.  A full examination was performed including scalp, head, eyes, ears, nose, lips, neck, chest, axillae, abdomen, back, buttocks, bilateral upper extremities, bilateral lower extremities, hands, feet, fingers, toes, fingernails, and toenails. All findings within normal limits unless otherwise noted below.  Rigth forehead x 1 Erythematous thin papules/macules with gritty scale.   Abdomen Pink patch   Assessment & Plan   Lentigines - Scattered tan macules - Due to sun exposure - Benign-appearing, observe - Recommend daily broad spectrum sunscreen SPF 30+ to sun-exposed areas, reapply every 2 hours as needed. - Call for any changes  Seborrheic Keratoses - Stuck-on, waxy, tan-brown papules and/or plaques  - Benign-appearing - Discussed benign etiology and prognosis. - Observe - Call for any changes  Melanocytic Nevi - Tan-brown and/or pink-flesh-colored symmetric macules and papules - Benign appearing on exam today - Observation - Call clinic for new or changing moles - Recommend daily use of broad spectrum spf 30+ sunscreen to sun-exposed areas.   Hemangiomas - Red papules - Discussed benign nature - Observe -  Call for any changes  Actinic Damage - Chronic condition, secondary to cumulative UV/sun exposure - diffuse scaly erythematous macules with underlying dyspigmentation - Recommend daily broad spectrum sunscreen SPF 30+ to sun-exposed areas, reapply every 2 hours as needed.  - Staying in the shade or wearing long sleeves, sun glasses (UVA+UVB protection) and wide brim hats (4-inch brim around the entire circumference of the hat) are also recommended for sun protection.  - Call for new or changing lesions.  Skin cancer screening performed today.  Inflamed seborrheic keratosis (21) Forehead x 4, right arm x 5, back x 12  Destruction of lesion - Forehead x 4, right arm x 5, back x 12 Complexity: simple   Destruction method: cryotherapy   Informed consent: discussed and consent obtained   Timeout:  patient name, date of birth, surgical site, and procedure verified Lesion destroyed using liquid nitrogen: Yes   Region frozen until ice ball extended beyond lesion: Yes   Outcome: patient tolerated procedure well with no complications   Post-procedure details: wound care instructions given    AK (actinic keratosis) Rigth forehead x 1  Destruction of lesion - Rigth forehead x 1 Complexity: simple   Destruction method: cryotherapy   Informed consent: discussed and consent obtained   Timeout:  patient name, date of birth, surgical site, and procedure verified Lesion destroyed using liquid nitrogen: Yes   Region frozen until ice ball extended beyond lesion: Yes   Outcome: patient tolerated procedure well with no complications   Post-procedure details: wound care instructions given    Eczema  Abdomen mometasone (ELOCON) 0.1 % cream - Abdomen Apply 1 application topically daily as needed (Rash). For eczema of abdomen Atopic dermatitis (eczema) is a chronic, relapsing, pruritic condition that can significantly affect quality of life. It is often associated with allergic rhinitis and/or asthma  and can require treatment with topical medications, phototherapy, or in severe cases biologic injectable medication (Dupixent; Adbry) or Oral JAK inhibitors.  Return in about 1 year (around 02/10/2022) for TBSE.  I, Ashok Cordia, CMA, am acting as scribe for Sarina Ser, MD . Documentation: I have reviewed the above documentation for accuracy and completeness, and I agree with the above.  Sarina Ser, MD

## 2021-02-10 NOTE — Patient Instructions (Signed)

## 2021-02-15 ENCOUNTER — Encounter: Payer: Self-pay | Admitting: Dermatology

## 2021-02-21 ENCOUNTER — Ambulatory Visit: Payer: Self-pay | Admitting: *Deleted

## 2021-02-21 ENCOUNTER — Encounter: Payer: Self-pay | Admitting: Unknown Physician Specialty

## 2021-02-21 NOTE — Telephone Encounter (Signed)
°  Chief Complaint: tick bite- red area at bite Symptoms: ring like area at bite site- bit Saturday- removed Sunday Frequency: redness does seem to be improving Pertinent Negatives: Patient denies fever,rash, headache Disposition: [] ED /[] Urgent Care (no appt availability in office) / [x] Appointment(In office/virtual)/ []  [place] [facility]/ [] Home Care/ [] Refused Recommended Disposition /[] [place] [facility]/ []  Follow-up with PCP Additional Notes: offered appointment today- patient unable to come- appointment scheduled for tomorrow am- patient wants to know if he needs medication now- within 24-48 hours

## 2021-02-21 NOTE — Telephone Encounter (Signed)
Summary: Tick bite with red mark   Patient called in to inform Dr Wynetta Emery that he had a tick bite from Saturday being in the woods but he did not notice it till Sunday and when he pulled it off it had a red mark which is still there on his arm  and he just need to know if he should be concerned Please call patient at Ph# [phone]      Reason for Disposition  Red ring or bull's-eye rash occurs at tick bite  Answer Assessment - Initial Assessment Questions 1. TYPE of TICK: "Is it a wood tick or a deer tick?" (e.g., deer tick, wood tick; unsure)     Small- unsure type 2. SIZE of TICK: "How big is the tick?" (e.g., size of poppy seed, apple seed, watermelon seed; unsure) Note: Deer ticks can be the size of a poppy seed (nymph) or an apple seed (adult).       small 3. ENGORGED: "Did the tick look flat or engorged (full, swollen)?" (e.g., flat, engorged; unsure)     Little engorged 4. LOCATION: "Where is the tick bite located?"      R arm- above joint 5. ONSET: "How long do you think the tick was attached before you removed it?" (e.g., 5 hours, 2 days)      Sat- discovered Sun 6. APPEARANCE of BITE or RASH: "What does the site look like?"     Red area at bite  7. PREGNANCY: "Is there any chance you are pregnant?" "When was your last menstrual period?"     *No Answer*  Protocols used: Tick Bite-A-AH

## 2021-02-22 ENCOUNTER — Ambulatory Visit: Payer: BC Managed Care – PPO | Admitting: Unknown Physician Specialty

## 2021-02-22 ENCOUNTER — Encounter: Payer: Self-pay | Admitting: Unknown Physician Specialty

## 2021-02-22 ENCOUNTER — Other Ambulatory Visit: Payer: Self-pay

## 2021-02-22 VITALS — BP 94/60 | HR 64 | Temp 98.0°F | Wt 195.0 lb

## 2021-02-22 DIAGNOSIS — S50861A Insect bite (nonvenomous) of right forearm, initial encounter: Secondary | ICD-10-CM | POA: Diagnosis not present

## 2021-02-22 DIAGNOSIS — F341 Dysthymic disorder: Secondary | ICD-10-CM | POA: Diagnosis not present

## 2021-02-22 DIAGNOSIS — W57XXXA Bitten or stung by nonvenomous insect and other nonvenomous arthropods, initial encounter: Secondary | ICD-10-CM | POA: Diagnosis not present

## 2021-02-22 MED ORDER — DOXYCYCLINE HYCLATE 100 MG PO TABS: 200.0000 mg | ORAL_TABLET | Freq: Once | ORAL | 0 refills | Status: AC

## 2021-02-22 NOTE — Progress Notes (Addendum)
BP 94/60    Pulse 64    Temp 98 F (36.7 C) (Oral)    Wt 195 lb (88.5 kg)    SpO2 98%    BMI 26.82 kg/m    Subjective:    Patient ID: Darius Williams, male    DOB: 12-21-1945, 76 y.o.   MRN: [id number]  HPI: Darius Williams is a 76 y.o. male  Chief Complaint  Patient presents with   Insect Bite    Pt states he was bit by a tick this past Saturday. States he removed the tick Sunday morning form his R arm. States the area has a red ring around it now. States the area does not itch.    Got the bite Saturday afternoon and found on Sunday morning.  This was a small deer tick.  Since pulling it out, found some redness around bite.  No symptoms of fever, headache, rash, GI complaints  Depression screen [facility] 2/9 02/22/2021 10/04/2020 12/05/2019 09/05/2019 07/19/2018  Decreased Interest 0 0 0 0 0  Down, Depressed, Hopeless 1 0 0 0 0  PHQ - 2 Score 1 0 0 0 0  Altered sleeping 2 1 - - 0  Tired, decreased energy 3 1 - - 0  Change in appetite 2 0 - - 0  Feeling bad or failure about yourself  1 0 - - 0  Trouble concentrating 0 0 - - 0  Moving slowly or fidgety/restless 1 0 - - 0  Suicidal thoughts 0 0 - - 0  PHQ-9 Score 10 2 - - 0  Difficult doing work/chores Not difficult at all Not difficult at all - - Not difficult at all   Discussed results.  Pt not interested in treatment at this time  Relevant past medical, surgical, family and social history reviewed and updated as indicated. Interim medical history since our last visit reviewed. Allergies and medications reviewed and updated.  Review of Systems  Constitutional: Negative.   Respiratory: Negative.    Gastrointestinal: Negative.    Per HPI unless specifically indicated above     Objective:    BP 94/60    Pulse 64    Temp 98 F (36.7 C) (Oral)    Wt 195 lb (88.5 kg)    SpO2 98%    BMI 26.82 kg/m   Wt Readings from Last 3 Encounters:  02/22/21 195 lb (88.5 kg)  10/04/20 187 lb (84.8 kg)  12/09/19 191 lb (86.6 kg)    Physical  Exam Constitutional:      General: He is not in acute distress.    Appearance: Normal appearance. He is well-developed.  HENT:     Head: Normocephalic and atraumatic.  Eyes:     General: Lids are normal. No scleral icterus.       Right eye: No discharge.        Left eye: No discharge.     Conjunctiva/sclera: Conjunctivae normal.  Neck:     Vascular: No carotid bruit or JVD.  Cardiovascular:     Rate and Rhythm: Normal rate and regular rhythm.     Heart sounds: Normal heart sounds.  Pulmonary:     Effort: Pulmonary effort is normal. No respiratory distress.     Breath sounds: Normal breath sounds.  Abdominal:     Palpations: There is no hepatomegaly or splenomegaly.  Musculoskeletal:        General: Normal range of motion.     Cervical back: Normal range of motion and neck  supple.  Skin:    General: Skin is warm and dry.     Coloration: Skin is not pale.     Findings: No rash.     Comments: Right forearm with 1/2 cm irritated area  Neurological:     Mental Status: He is alert and oriented to person, place, and time.  Psychiatric:        Behavior: Behavior normal.        Thought Content: Thought content normal.        Judgment: Judgment normal.    Results for orders placed or performed in visit on 10/04/20  Microscopic Examination   Urine  Result Value Ref Range   WBC, UA 0-5 0 - 5 /hpf   RBC 3-10 (A) 0 - 2 /hpf   Epithelial Cells (non renal) 0-10 0 - 10 /hpf   Bacteria, UA None seen None seen/Few  Comprehensive metabolic panel  Result Value Ref Range   Glucose 92 70 - 99 mg/dL   BUN 12 8 - 27 mg/dL   Creatinine, Ser 0.84 0.76 - 1.27 mg/dL   eGFR 91 >59 mL/min/1.73   BUN/Creatinine Ratio 14 10 - 24   Sodium 138 134 - 144 mmol/L   Potassium 4.0 3.5 - 5.2 mmol/L   Chloride 99 96 - 106 mmol/L   CO2 25 20 - 29 mmol/L   Calcium 9.2 8.6 - 10.2 mg/dL   Total Protein 7.0 6.0 - 8.5 g/dL   Albumin 4.8 (H) 3.7 - 4.7 g/dL   Globulin, Total 2.2 1.5 - 4.5 g/dL    Albumin/Globulin Ratio 2.2 1.2 - 2.2   Bilirubin Total 0.8 0.0 - 1.2 mg/dL   Alkaline Phosphatase 79 44 - 121 IU/L   AST 26 0 - 40 IU/L   ALT 25 0 - 44 IU/L  CBC with Differential/Platelet  Result Value Ref Range   WBC 4.8 3.4 - 10.8 x10E3/uL   RBC 4.43 4.14 - 5.80 x10E6/uL   Hemoglobin 14.3 13.0 - 17.7 g/dL   Hematocrit 42.4 37.5 - 51.0 %   MCV 96 79 - 97 fL   MCH 32.3 26.6 - 33.0 pg   MCHC 33.7 31.5 - 35.7 g/dL   RDW 12.4 11.6 - 15.4 %   Platelets 123 (L) 150 - 450 x10E3/uL   Neutrophils 57 Not Estab. %   Lymphs 29 Not Estab. %   Monocytes 10 Not Estab. %   Eos 3 Not Estab. %   Basos 1 Not Estab. %   Neutrophils Absolute 2.8 1.4 - 7.0 x10E3/uL   Lymphocytes Absolute 1.4 0.7 - 3.1 x10E3/uL   Monocytes Absolute 0.5 0.1 - 0.9 x10E3/uL   EOS (ABSOLUTE) 0.1 0.0 - 0.4 x10E3/uL   Basophils Absolute 0.0 0.0 - 0.2 x10E3/uL   Immature Granulocytes 0 Not Estab. %   Immature Grans (Abs) 0.0 0.0 - 0.1 x10E3/uL  Lipid Panel w/o Chol/HDL Ratio  Result Value Ref Range   Cholesterol, Total 185 100 - 199 mg/dL   Triglycerides 110 0 - 149 mg/dL   HDL 48 >39 mg/dL   VLDL Cholesterol Cal 20 5 - 40 mg/dL   LDL Chol Calc (NIH) 117 (H) 0 - 99 mg/dL  TSH  Result Value Ref Range   TSH 1.600 0.450 - 4.500 uIU/mL  Urinalysis, Routine w reflex microscopic  Result Value Ref Range   Specific Gravity, UA 1.020 1.005 - 1.030   pH, UA 5.0 5.0 - 7.5   Color, UA Yellow Yellow   Appearance Ur  Clear Clear   Leukocytes,UA Negative Negative   Protein,UA Negative Negative/Trace   Glucose, UA Negative Negative   Ketones, UA Negative Negative   RBC, UA 2+ (A) Negative   Bilirubin, UA Negative Negative   Urobilinogen, Ur 0.2 0.2 - 1.0 mg/dL   Nitrite, UA Negative Negative   Microscopic Examination See below:   Bayer DCA Hb A1c Waived  Result Value Ref Range   HB A1C (BAYER DCA - WAIVED) 6.0 (H) 4.8 - 5.6 %      Assessment & Plan:   Problem List Items Addressed This Visit   None Visit Diagnoses      Tick bite of right forearm, initial encounter    -  Primary   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  Will rx Doxycycline prophylaxis 200 mg x 1 dose.  Symptoms of tick illness discussed   Dysthymia       discussed PHQ 9 scores.  Pt not interested in treatment at this time.  Offered appointment if needed        Follow up plan: Return if symptoms worsen or fail to improve.

## 2021-02-28 IMAGING — CT CT CHEST W/O CM
2 of 3 series · 15 of 36 positions shown, 18 images · non-contrast
Comparison: None.

CLINICAL DATA: Dilated aortic root.

EXAM:
CT CHEST WITHOUT CONTRAST
TECHNIQUE: Multidetector CT imaging of the chest was performed following the
standard protocol without IV contrast.

[Series 2: thorax · axial · 0.77mm/px · z∈[-397,-117]mm · 12 of 166 slices shown, 15 images]
[im 13/166  mediastinal]
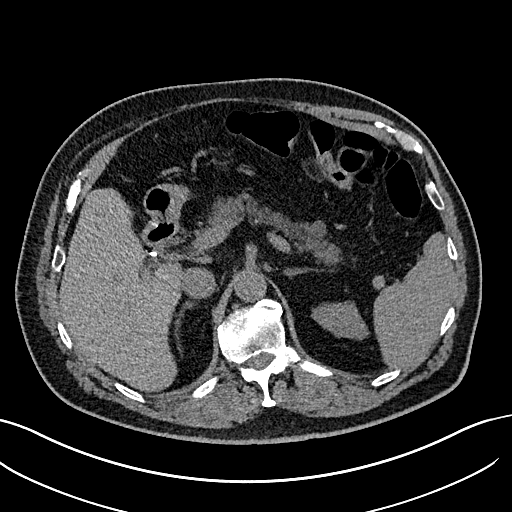
[im 13/166  lung]
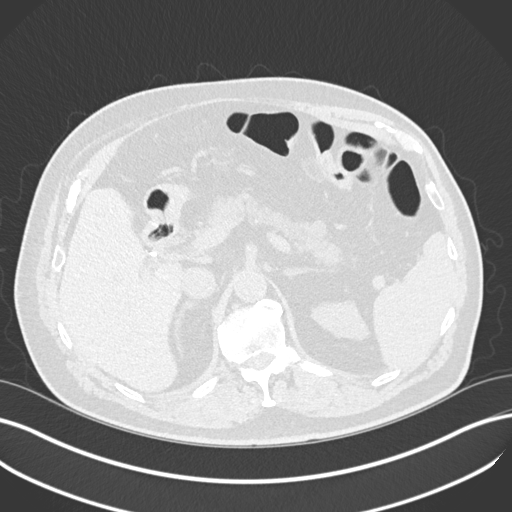
[im 25/166  lung]
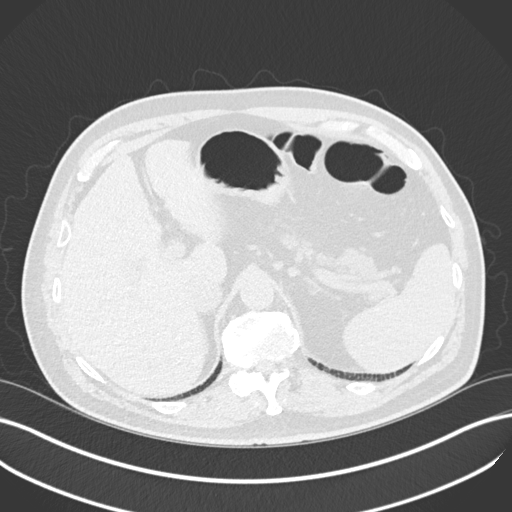
[im 37/166  lung]
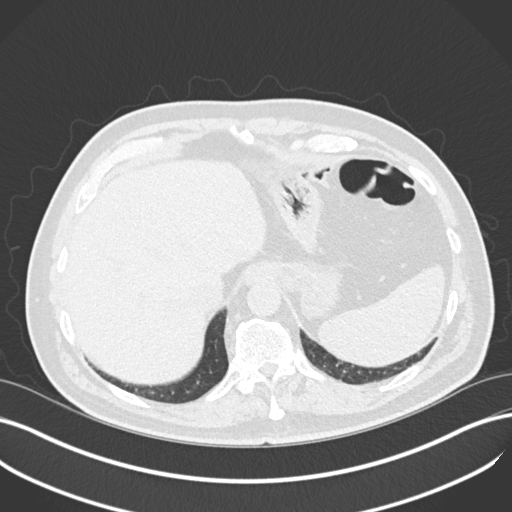
[im 49/166  lung]
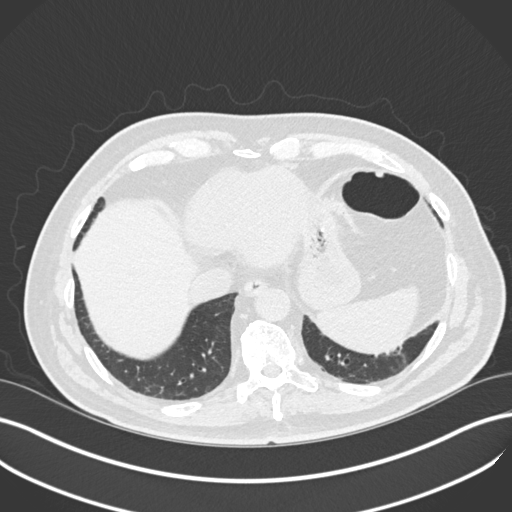
[im 62/166  mediastinal]
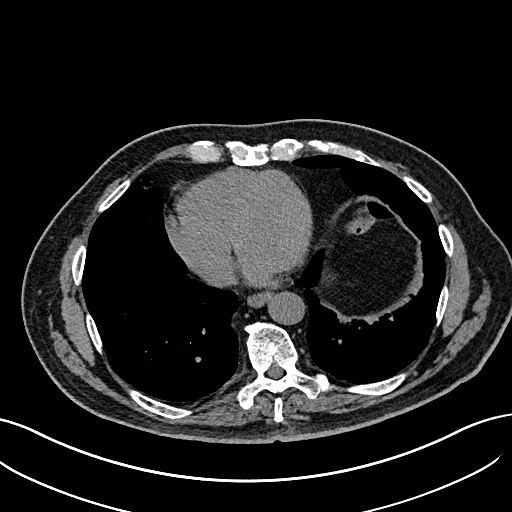
[im 62/166  lung]
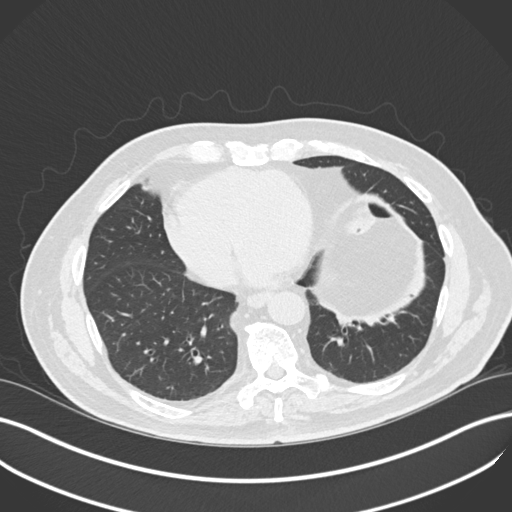
[im 74/166  lung]
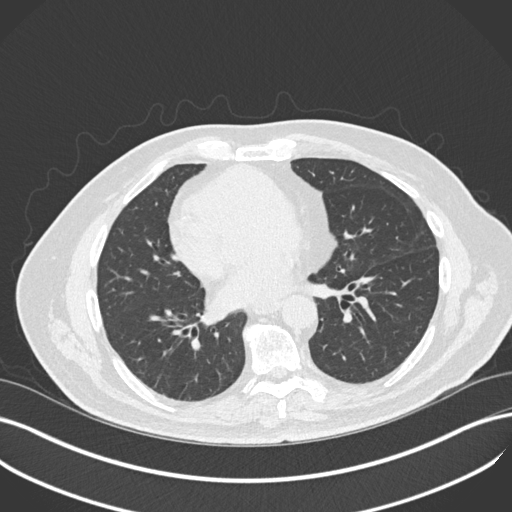
[im 92/166  lung]
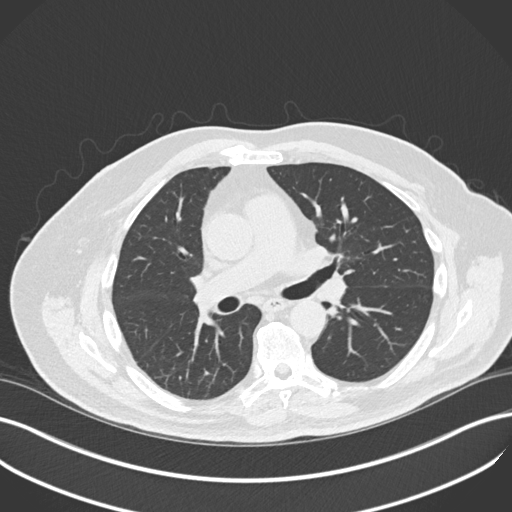
[im 104/166  lung]
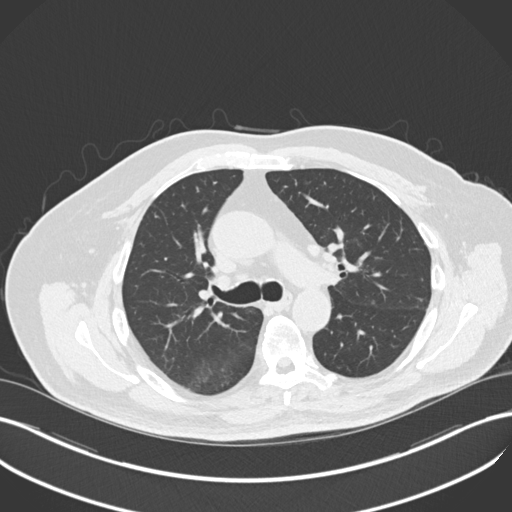
[im 117/166  mediastinal]
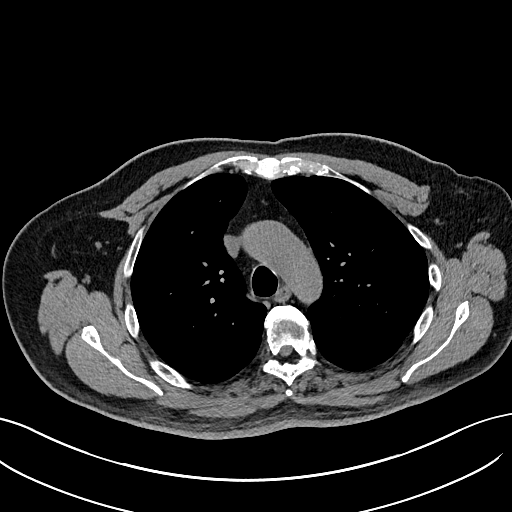
[im 117/166  lung]
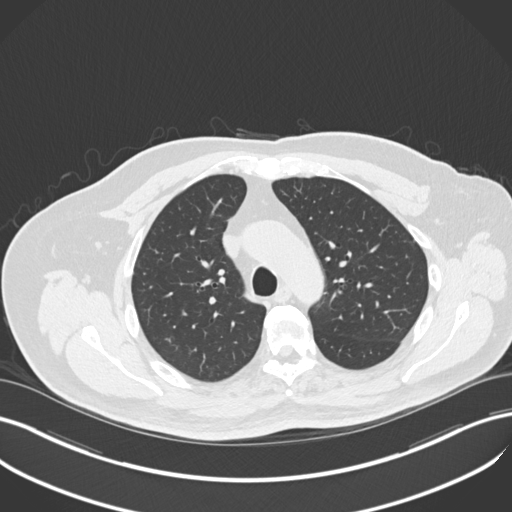
[im 129/166  lung]
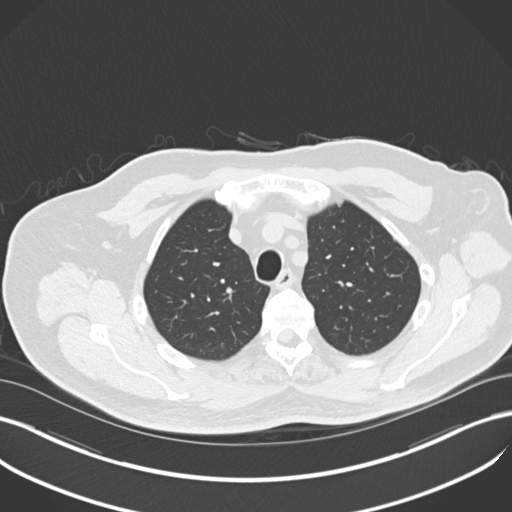
[im 141/166  lung]
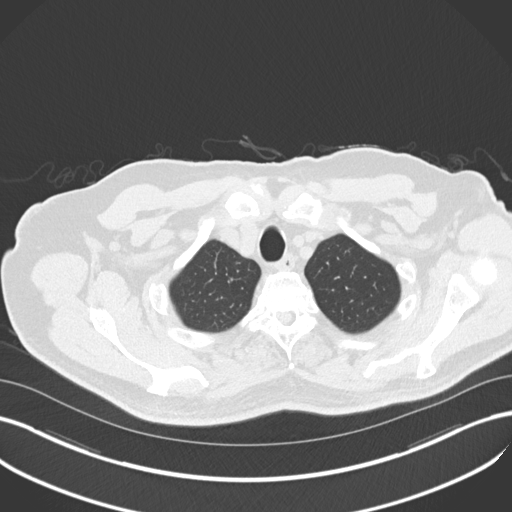
[im 153/166  lung]
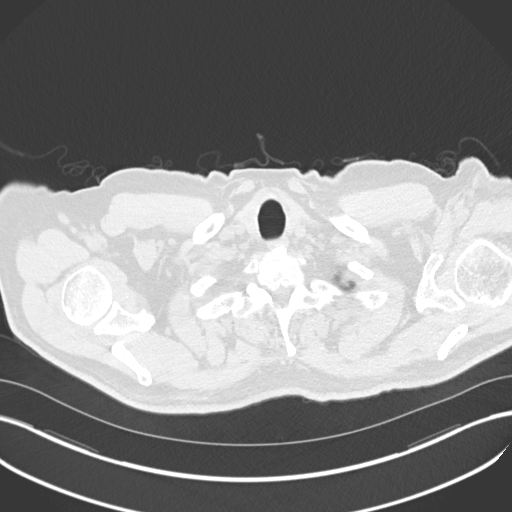

[Series 5: coronal · coronal · 0.70mm/px · 3 of 146 slices shown]
[im 30/146  lung]
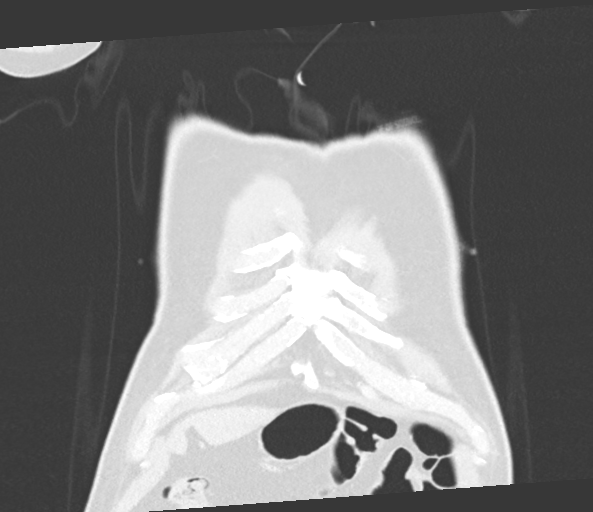
[im 59/146  lung]
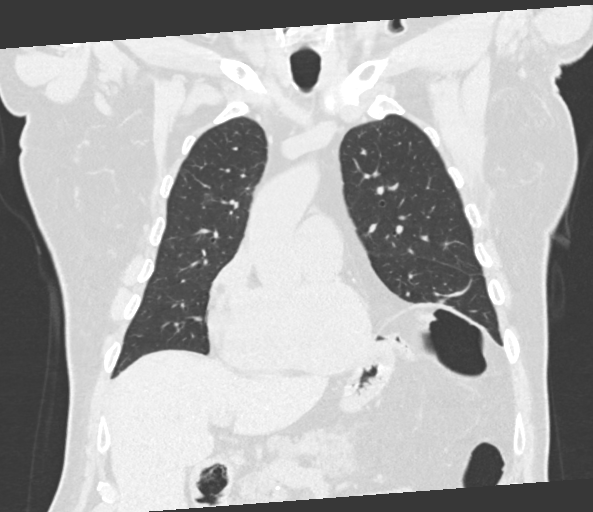
[im 88/146  lung]
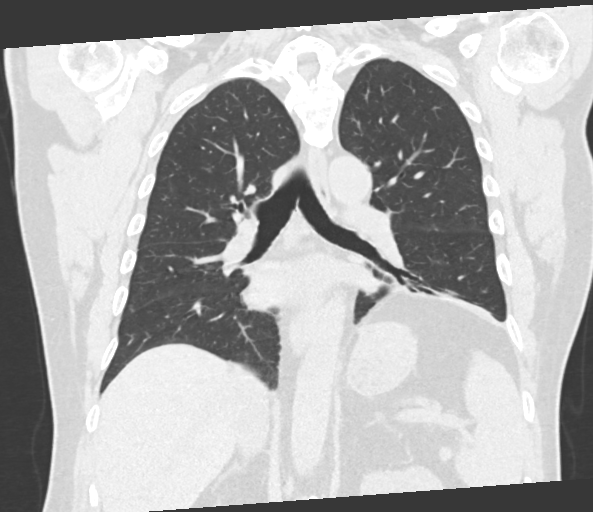

[15 of 36 positions shown; findings below may reference images not displayed]

FINDINGS: Cardiovascular: On the unenhanced scan, the aortic root at the level
of the sinuses of Valsalva is likely mildly dilated measuring
between 3.8-4.1 cm. Correlation with echocardiography measurements
suggested as unenhanced CT measurements may not be entirely
accurate. The ascending thoracic aorta is of normal caliber and
measures 3.5 cm in greatest diameter. The proximal arch measures
cm and the distal arch 2.9 cm. The descending thoracic aorta
measures 2.6 cm. There is minimal calcified plaque at the level of
the distal arch. Proximal great vessels demonstrate bovine branching
anatomy.

Calcified plaque present involving the coronary arteries in a 3
vessel distribution. The heart size is normal. No pericardial fluid
identified. Central pulmonary arteries are normal in caliber.

Mediastinum/Nodes: Tiny scattered mediastinal lymph nodes. No
enlarged mediastinal or axillary lymph nodes. Thyroid gland,
trachea, and esophagus demonstrate no significant findings.

Lungs/Pleura: Oval-shaped nodule in the posterior left lower lobe on
image 95/3 measures approximately 4 x 6 mm. More inferiorly in the
posterior basilar left lower lobe, small nodule on image 106/3
measures approximately 3 x 5 mm.

Upper Abdomen: No acute abnormality.

Musculoskeletal: No chest wall mass or suspicious bone lesions
identified.
IMPRESSION: 1. Probable mild dilatation of the aortic root at the level of the
sinuses of Valsalva measuring between 3.8-4.1 cm. Correlation with
echocardiography measurements suggested as unenhanced CT
measurements may not be entirely accurate. There is no significant
dilatation of the ascending thoracic aorta.
2. Two separate small left lower lobe pulmonary nodules identified
measuring 4 x 6 mm and 3 x 5 mm. Non-contrast chest CT at 6-12
months is recommended. If the nodule is stable at time of repeat CT,
then future CT at 18-24 months (from today's scan) is considered
optional for low-risk patients, but is recommended for high-risk
patients. This recommendation follows the consensus statement:
Guidelines for Management of Incidental Pulmonary Nodules Detected
3. Coronary atherosclerosis with calcified plaque in a 3 vessel
distribution.

## 2021-05-02 ENCOUNTER — Ambulatory Visit: Payer: BC Managed Care – PPO | Admitting: Nurse Practitioner

## 2021-05-02 ENCOUNTER — Encounter: Payer: Self-pay | Admitting: Nurse Practitioner

## 2021-05-02 ENCOUNTER — Ambulatory Visit: Payer: Self-pay | Admitting: *Deleted

## 2021-05-02 VITALS — BP 117/68 | HR 63 | Temp 97.7°F | Wt 194.6 lb

## 2021-05-02 DIAGNOSIS — U071 COVID-19: Secondary | ICD-10-CM | POA: Insufficient documentation

## 2021-05-02 MED ORDER — MOLNUPIRAVIR EUA 200MG CAPSULE: 4.0000 | ORAL_CAPSULE | Freq: Two times a day (BID) | ORAL | 0 refills | Status: AC

## 2021-05-02 NOTE — Telephone Encounter (Signed)
Patient being seen today @ 11:20 with Darius Williams ?

## 2021-05-02 NOTE — Progress Notes (Signed)
? ?BP 117/68   Pulse 63   Temp 97.7 ?F (36.5 ?C) (Oral)   Wt 194 lb 9.6 oz (88.3 kg)   SpO2 98%   BMI 26.76 kg/m?   ? ?Subjective:  ? ? Patient ID: Darius Williams, male    DOB: 09-29-1945, 76 y.o.   MRN: [id number] ? ?HPI: ?Darius Williams is a 76 y.o. male ? ?Chief Complaint  ?Patient presents with  ? Cough  ?  Ongoing x 3 days fever at home of 101 last night. OTC tylenol. Afebrile in office today.  ?2 positive covid test at home.   ? ?UPPER RESPIRATORY TRACT INFECTION ?Worst symptom: Symptoms started on Saturday.  Positive COVID test was last night.  ?Fever: yes -101 ?Cough: yes ?Shortness of breath: no ?Wheezing:  a little ?Chest pain: no ?Chest tightness: no ?Chest congestion: yes ?Nasal congestion: yes ?Runny nose: yes ?Post nasal drip: yes ?Sneezing: no ?Sore throat: no ?Swollen glands: no ?Sinus pressure: no ?Headache: no ?Face pain: no ?Toothache: no ?Ear pain: no bilateral ?Ear pressure: no bilateral ?Eyes red/itching:no ?Eye drainage/crusting: no  ?Vomiting: no ?Rash: no ?Fatigue: yes ?Sick contacts: no ?Strep contacts: no  ?Context: stable ?Recurrent sinusitis: no ?Relief with OTC cold/cough medications: no  ?Treatments attempted: mucinex  ? ?Relevant past medical, surgical, family and social history reviewed and updated as indicated. Interim medical history since our last visit reviewed. ?Allergies and medications reviewed and updated. ? ?Review of Systems  ?Constitutional:  Positive for fatigue. Negative for fever.  ?HENT:  Positive for congestion, postnasal drip and rhinorrhea. Negative for ear pain, sinus pressure, sinus pain, sneezing and sore throat.   ?Respiratory:  Positive for cough and wheezing. Negative for chest tightness and shortness of breath.   ?Gastrointestinal:  Negative for vomiting.  ?Skin:  Negative for rash.  ?Neurological:  Negative for headaches.  ? ?Per HPI unless specifically indicated above ? ?   ?Objective:  ?  ?BP 117/68   Pulse 63   Temp 97.7 ?F (36.5 ?C) (Oral)   Wt 194  lb 9.6 oz (88.3 kg)   SpO2 98%   BMI 26.76 kg/m?   ?Wt Readings from Last 3 Encounters:  ?05/02/21 194 lb 9.6 oz (88.3 kg)  ?02/22/21 195 lb (88.5 kg)  ?10/04/20 187 lb (84.8 kg)  ?  ?Physical Exam ?Vitals and nursing note reviewed.  ?Constitutional:   ?   General: He is not in acute distress. ?   Appearance: Normal appearance. He is not ill-appearing, toxic-appearing or diaphoretic.  ?HENT:  ?   Head: Normocephalic.  ?   Right Ear: Tympanic membrane and external ear normal.  ?   Left Ear: Tympanic membrane and external ear normal.  ?   Nose: Congestion and rhinorrhea present.  ?   Mouth/Throat:  ?   Mouth: Mucous membranes are moist.  ?   Pharynx: Posterior oropharyngeal erythema present. No oropharyngeal exudate.  ?Eyes:  ?   General:     ?   Right eye: No discharge.     ?   Left eye: No discharge.  ?   Extraocular Movements: Extraocular movements intact.  ?   Conjunctiva/sclera: Conjunctivae normal.  ?   Pupils: Pupils are equal, round, and reactive to light.  ?Cardiovascular:  ?   Rate and Rhythm: Normal rate and regular rhythm.  ?   Heart sounds: No murmur heard. ?Pulmonary:  ?   Effort: Pulmonary effort is normal. No respiratory distress.  ?   Breath sounds: Normal breath  sounds. No wheezing, rhonchi or rales.  ?Abdominal:  ?   General: Abdomen is flat. Bowel sounds are normal.  ?Musculoskeletal:  ?   Cervical back: Normal range of motion and neck supple.  ?Skin: ?   General: Skin is warm and dry.  ?   Capillary Refill: Capillary refill takes less than 2 seconds.  ?Neurological:  ?   General: No focal deficit present.  ?   Mental Status: He is alert and oriented to person, place, and time.  ?Psychiatric:     ?   Mood and Affect: Mood normal.     ?   Behavior: Behavior normal.     ?   Thought Content: Thought content normal.     ?   Judgment: Judgment normal.  ? ? ?Results for orders placed or performed in visit on 10/04/20  ?Microscopic Examination  ? Urine  ?Result Value Ref Range  ? WBC, UA 0-5 0 - 5 /hpf   ? RBC 3-10 (A) 0 - 2 /hpf  ? Epithelial Cells (non renal) 0-10 0 - 10 /hpf  ? Bacteria, UA None seen None seen/Few  ?Comprehensive metabolic panel  ?Result Value Ref Range  ? Glucose 92 70 - 99 mg/dL  ? BUN 12 8 - 27 mg/dL  ? Creatinine, Ser 0.84 0.76 - 1.27 mg/dL  ? eGFR 91 >59 mL/min/1.73  ? BUN/Creatinine Ratio 14 10 - 24  ? Sodium 138 134 - 144 mmol/L  ? Potassium 4.0 3.5 - 5.2 mmol/L  ? Chloride 99 96 - 106 mmol/L  ? CO2 25 20 - 29 mmol/L  ? Calcium 9.2 8.6 - 10.2 mg/dL  ? Total Protein 7.0 6.0 - 8.5 g/dL  ? Albumin 4.8 (H) 3.7 - 4.7 g/dL  ? Globulin, Total 2.2 1.5 - 4.5 g/dL  ? Albumin/Globulin Ratio 2.2 1.2 - 2.2  ? Bilirubin Total 0.8 0.0 - 1.2 mg/dL  ? Alkaline Phosphatase 79 44 - 121 IU/L  ? AST 26 0 - 40 IU/L  ? ALT 25 0 - 44 IU/L  ?CBC with Differential/Platelet  ?Result Value Ref Range  ? WBC 4.8 3.4 - 10.8 x10E3/uL  ? RBC 4.43 4.14 - 5.80 x10E6/uL  ? Hemoglobin 14.3 13.0 - 17.7 g/dL  ? Hematocrit 42.4 37.5 - 51.0 %  ? MCV 96 79 - 97 fL  ? MCH 32.3 26.6 - 33.0 pg  ? MCHC 33.7 31.5 - 35.7 g/dL  ? RDW 12.4 11.6 - 15.4 %  ? Platelets 123 (L) 150 - 450 x10E3/uL  ? Neutrophils 57 Not Estab. %  ? Lymphs 29 Not Estab. %  ? Monocytes 10 Not Estab. %  ? Eos 3 Not Estab. %  ? Basos 1 Not Estab. %  ? Neutrophils Absolute 2.8 1.4 - 7.0 x10E3/uL  ? Lymphocytes Absolute 1.4 0.7 - 3.1 x10E3/uL  ? Monocytes Absolute 0.5 0.1 - 0.9 x10E3/uL  ? EOS (ABSOLUTE) 0.1 0.0 - 0.4 x10E3/uL  ? Basophils Absolute 0.0 0.0 - 0.2 x10E3/uL  ? Immature Granulocytes 0 Not Estab. %  ? Immature Grans (Abs) 0.0 0.0 - 0.1 x10E3/uL  ?Lipid Panel w/o Chol/HDL Ratio  ?Result Value Ref Range  ? Cholesterol, Total 185 100 - 199 mg/dL  ? Triglycerides 110 0 - 149 mg/dL  ? HDL 48 >39 mg/dL  ? VLDL Cholesterol Cal 20 5 - 40 mg/dL  ? LDL Chol Calc (NIH) 117 (H) 0 - 99 mg/dL  ?TSH  ?Result Value Ref Range  ? TSH 1.600 0.450 -  4.500 uIU/mL  ?Urinalysis, Routine w reflex microscopic  ?Result Value Ref Range  ? Specific Gravity, UA 1.020 1.005 - 1.030   ? pH, UA 5.0 5.0 - 7.5  ? Color, UA Yellow Yellow  ? Appearance Ur Clear Clear  ? Leukocytes,UA Negative Negative  ? Protein,UA Negative Negative/Trace  ? Glucose, UA Negative Negative  ? Ketones, UA Negative Negative  ? RBC, UA 2+ (A) Negative  ? Bilirubin, UA Negative Negative  ? Urobilinogen, Ur 0.2 0.2 - 1.0 mg/dL  ? Nitrite, UA Negative Negative  ? Microscopic Examination See below:   ?Bayer DCA Hb A1c Waived  ?Result Value Ref Range  ? HB A1C (BAYER DCA - WAIVED) 6.0 (H) 4.8 - 5.6 %  ? ?   ?Assessment & Plan:  ? ?Problem List Items Addressed This Visit   ? ?  ? Other  ? COVID-19 - Primary  ?  Molnupiravir sent to the pharmacy. Side effects and benefits discussed. Recommend staying well hydrated. Use prn tylenol/motrin for fever and body aches. Discussed signs and symptoms to monitor for and when to seek higher level of care. Follow up if symptoms worsen or fail to improve. ? ?  ?  ? Relevant Medications  ? molnupiravir EUA (LAGEVRIO) 200 mg CAPS capsule  ?  ? ?Follow up plan: ?Return if symptoms worsen or fail to improve. ? ? ? ? ? ?

## 2021-05-02 NOTE — Assessment & Plan Note (Signed)
Molnupiravir sent to the pharmacy. Side effects and benefits discussed. Recommend staying well hydrated. Use prn tylenol/motrin for fever and body aches. Discussed signs and symptoms to monitor for and when to seek higher level of care. Follow up if symptoms worsen or fail to improve. ?

## 2021-05-02 NOTE — Telephone Encounter (Signed)
?  Chief Complaint: Covid positive ?Symptoms:Cough, mostly dry, fatigue, headache, Fever 101.0 yesterday, unsure if febrile this AM  ?Frequency: Symptoms onset Saturday ?Pertinent Negatives: Patient denies SOB, wheezing, CP ?Disposition: []ED /[]Urgent Care (no appt availability in office) / [x]Appointment(In office/virtual)/ [] [facility]/ []Home Care/ []Refused Recommended Disposition /[][facility]/ [] Follow-up with PCP ?Additional Notes: Spoke with wife on [place]. Pt was holding class, online. 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 He has so much to do." Advised would need virtual appt, none available today. Requested to let Dr. Wynetta Emery know and advise. Assured NT would route to practice for PCPs review. States please call her cell if needed. [phone]. Care advise provided as well as guidelines for self isolation. Verbalizes understanding.  ?Please advise. ?Reason for Disposition ? [1] COVID-19 diagnosed by positive lab test (e.g., PCR, rapid self-test kit) AND [2] mild symptoms (e.g., cough, fever, others) AND [3] no complications or SOB ? ?Answer Assessment - Initial Assessment Questions ?1. COVID-19 DIAGNOSIS: "Who made your COVID-19 diagnosis?" "Was it confirmed by a positive lab test or self-test?" If not diagnosed by a doctor (or NP/PA), ask "Are there lots of cases (community spread) where you live?" Note: See public health department website, if unsure. ?    Home test ?2. COVID-19 EXPOSURE: "Was there any known exposure to COVID before the symptoms began?" CDC Definition of close contact: within 6 feet (2 meters) for a total of 15 minutes or more over a 24-hour period.  ?    Yes, a student ?3. ONSET: "When did the COVID-19 symptoms start?"  ?    Yesterday (Sunday) ?4. WORST SYMPTOM: "What is your worst symptom?" (e.g., cough, fever, shortness of breath, muscle aches) ?    Cough,  headache, faatigue ?5. COUGH: "Do you have a cough?" If Yes, ask: "How bad is the cough?"   ?    Yes. Little bit, unsure of color ?6. FEVER: "Do you have a fever?" If Yes, ask: "What is your temperature, how was it measured, and when did it start?" ?    101.0 yesterday, unsure today ?7. RESPIRATORY STATUS: "Describe your breathing?" (e.g., shortness of breath, wheezing, unable to speak)  ?    no ?8. BETTER-SAME-WORSE: "Are you getting better, staying the same or getting worse compared to yesterday?"  If getting worse, ask, "In what way?" ?    Worse ?9. HIGH RISK DISEASE: "Do you have any chronic medical problems?" (e.g., asthma, heart or lung disease, weak immune system, obesity, etc.) ?     ?10. VACCINE: "Have you had the COVID-19 vaccine?" If Yes, ask: "Which one, how many shots, when did you get it?" ?      Kathryne Eriksson Pfizer ?11. BOOSTER: "Have you received your COVID-19 booster?" If Yes, ask: "Which one and when did you get it?" ?      2, pfizer  last 10/14/19 ? ?Protocols used: Coronavirus (BCWUG-89) Diagnosed or Suspected-A-AH ? ?

## 2021-05-05 ENCOUNTER — Ambulatory Visit: Payer: Self-pay

## 2021-05-05 NOTE — Telephone Encounter (Signed)
?  Chief Complaint: rash ?Symptoms: rash under L arm, in between legs, red and possibly blistered  ?Frequency: yesterday evening ?Pertinent Negatives: Patient denies itching that's severe ?Disposition: '[]'$ ED /'[]'$ Urgent Care (no appt availability in office) / '[x]'$ Appointment(In office/virtual)/ '[]'$  [facility]/ '[]'$ Home Care/ '[]'$ Refused Recommended Disposition /'[]'$ [facility]/ '[]'$  Follow-up with PCP ?Additional Notes: pt is unsure if rash from COVID or the medication but wanted some advice. I advised him to not the take medication until he is seen tomorrow and scheduled appt for 1300 with Dr. Neomia Dear. Pt states he would be there.  ? ?Summary: Covid med/rash  ? Pls fu with pt as came in on Monday and was diagnosed with Covid and has been taking the red pills he was given molnupiravir EUA (LAGEVRIO) 200 mg CAPS?  states they are red. He states he has a rash all over, has done some research/ is rash from covid or pill what does he do [phone]   ?  ? ?Reason for Disposition ? Medicine patch causing local rash or itching ? ?Answer Assessment - Initial Assessment Questions ?1. NAME of MEDICATION: "What medicine are you calling about?" ?    molnupiravir ?2. QUESTION: "What is your question?" (e.g., double dose of medicine, side effect) ?    Developed a rash  ?3. PRESCRIBING HCP: "Who prescribed it?" Reason: if prescribed by specialist, call should be referred to that group. ?    Dr. Wynetta Emery ?4. SYMPTOMS: "Do you have any symptoms?" ?    Rash in between legs and under L arm  ?5. SEVERITY: If symptoms are present, ask "Are they mild, moderate or severe?" ?    moderate ? ?Protocols used: Medication Question Call-A-AH ? ?

## 2021-05-06 ENCOUNTER — Encounter: Payer: Self-pay | Admitting: Internal Medicine

## 2021-05-06 ENCOUNTER — Ambulatory Visit (INDEPENDENT_AMBULATORY_CARE_PROVIDER_SITE_OTHER): Payer: BC Managed Care – PPO | Admitting: Internal Medicine

## 2021-05-06 VITALS — BP 122/72 | HR 62 | Temp 97.6°F | Ht 71.26 in | Wt 197.2 lb

## 2021-05-06 DIAGNOSIS — B354 Tinea corporis: Secondary | ICD-10-CM | POA: Diagnosis not present

## 2021-05-06 MED ORDER — CEPHALEXIN 500 MG PO CAPS: 500.0000 mg | ORAL_CAPSULE | Freq: Two times a day (BID) | ORAL | 0 refills | Status: AC

## 2021-05-06 MED ORDER — FLUCONAZOLE 150 MG PO TABS: 150.0000 mg | ORAL_TABLET | Freq: Every day | ORAL | 0 refills | Status: AC

## 2021-05-06 NOTE — Progress Notes (Signed)
? ?BP 122/72   Pulse 62   Temp 97.6 ?F (36.4 ?C) (Oral)   Ht 5' 11.26" (1.81 m)   Wt 197 lb 3.2 oz (89.4 kg)   SpO2 99%   BMI 27.30 kg/m?   ? ?Subjective:  ? ? Patient ID: Darius Williams, male    DOB: [birth year], 76 y.o.   MRN: [id number] ? ?Chief Complaint  ?Patient presents with  ? Covid Positive  ?  Was given covid anti-viral because he developed a rash yesterday  in groin and backs of arms and arm pits. and was told to stop taking the antiviral  ? ? ?HPI: ?Darius Williams is a 76 y.o. male ? ?Pt is here for a rash he took molnupiravir for COVID and had this rash which he thought was related he was told to stop the medicaiton when he called the office.  ?He feels his rash isnt better and co itching and reddish discoloration of the axilla and the groin  ? ?Rash ?This is a new problem. The current episode started in the past 7 days. The problem has been gradually worsening since onset. Location: on the left axilla and groin. Pertinent negatives include no anorexia, congestion, cough, diarrhea, eye pain, facial edema, fatigue, joint pain, nail changes, rhinorrhea, shortness of breath, sore throat or vomiting. Past treatments include nothing.  ? ?Chief Complaint  ?Patient presents with  ? Covid Positive  ?  Was given covid anti-viral because he developed a rash yesterday  in groin and backs of arms and arm pits. and was told to stop taking the antiviral  ? ? ?Relevant past medical, surgical, family and social history reviewed and updated as indicated. Interim medical history since our last visit reviewed. ?Allergies and medications reviewed and updated. ? ?Review of Systems  ?Constitutional:  Negative for fatigue.  ?HENT:  Negative for congestion, rhinorrhea and sore throat.   ?Eyes:  Negative for pain.  ?Respiratory:  Negative for cough and shortness of breath.   ?Gastrointestinal:  Negative for anorexia, diarrhea and vomiting.  ?Musculoskeletal:  Negative for joint pain.  ?Skin:  Positive for rash. Negative for  nail changes.  ? ?Per HPI unless specifically indicated above ? ?   ?Objective:  ?  ?BP 122/72   Pulse 62   Temp 97.6 ?F (36.4 ?C) (Oral)   Ht 5' 11.26" (1.81 m)   Wt 197 lb 3.2 oz (89.4 kg)   SpO2 99%   BMI 27.30 kg/m?   ?Wt Readings from Last 3 Encounters:  ?05/06/21 197 lb 3.2 oz (89.4 kg)  ?05/02/21 194 lb 9.6 oz (88.3 kg)  ?02/22/21 195 lb (88.5 kg)  ?  ?Physical Exam ?Vitals and nursing note reviewed.  ?Constitutional:   ?   Appearance: Normal appearance. He is not diaphoretic.  ?Pulmonary:  ?   Effort: No respiratory distress.  ?Abdominal:  ?   Palpations: Abdomen is soft.  ?   Tenderness: There is no abdominal tenderness.  ?Musculoskeletal:  ?   Cervical back: Normal range of motion and neck supple. No rigidity or tenderness.  ?   Left lower leg: No edema.  ?Skin: ?   General: Skin is warm and dry.  ?   Findings: Rash present. No lesion.  ?   Comments: Left axilla and bil groin rash noted  ?Neurological:  ?   Mental Status: He is alert.  ? ? ?Results for orders placed or performed in visit on 10/04/20  ?Microscopic Examination  ? Urine  ?Result  Value Ref Range  ? WBC, UA 0-5 0 - 5 /hpf  ? RBC 3-10 (A) 0 - 2 /hpf  ? Epithelial Cells (non renal) 0-10 0 - 10 /hpf  ? Bacteria, UA None seen None seen/Few  ?Comprehensive metabolic panel  ?Result Value Ref Range  ? Glucose 92 70 - 99 mg/dL  ? BUN 12 8 - 27 mg/dL  ? Creatinine, Ser 0.84 0.76 - 1.27 mg/dL  ? eGFR 91 >59 mL/min/1.73  ? BUN/Creatinine Ratio 14 10 - 24  ? Sodium 138 134 - 144 mmol/L  ? Potassium 4.0 3.5 - 5.2 mmol/L  ? Chloride 99 96 - 106 mmol/L  ? CO2 25 20 - 29 mmol/L  ? Calcium 9.2 8.6 - 10.2 mg/dL  ? Total Protein 7.0 6.0 - 8.5 g/dL  ? Albumin 4.8 (H) 3.7 - 4.7 g/dL  ? Globulin, Total 2.2 1.5 - 4.5 g/dL  ? Albumin/Globulin Ratio 2.2 1.2 - 2.2  ? Bilirubin Total 0.8 0.0 - 1.2 mg/dL  ? Alkaline Phosphatase 79 44 - 121 IU/L  ? AST 26 0 - 40 IU/L  ? ALT 25 0 - 44 IU/L  ?CBC with Differential/Platelet  ?Result Value Ref Range  ? WBC 4.8 3.4 - 10.8  x10E3/uL  ? RBC 4.43 4.14 - 5.80 x10E6/uL  ? Hemoglobin 14.3 13.0 - 17.7 g/dL  ? Hematocrit 42.4 37.5 - 51.0 %  ? MCV 96 79 - 97 fL  ? MCH 32.3 26.6 - 33.0 pg  ? MCHC 33.7 31.5 - 35.7 g/dL  ? RDW 12.4 11.6 - 15.4 %  ? Platelets 123 (L) 150 - 450 x10E3/uL  ? Neutrophils 57 Not Estab. %  ? Lymphs 29 Not Estab. %  ? Monocytes 10 Not Estab. %  ? Eos 3 Not Estab. %  ? Basos 1 Not Estab. %  ? Neutrophils Absolute 2.8 1.4 - 7.0 x10E3/uL  ? Lymphocytes Absolute 1.4 0.7 - 3.1 x10E3/uL  ? Monocytes Absolute 0.5 0.1 - 0.9 x10E3/uL  ? EOS (ABSOLUTE) 0.1 0.0 - 0.4 x10E3/uL  ? Basophils Absolute 0.0 0.0 - 0.2 x10E3/uL  ? Immature Granulocytes 0 Not Estab. %  ? Immature Grans (Abs) 0.0 0.0 - 0.1 x10E3/uL  ?Lipid Panel w/o Chol/HDL Ratio  ?Result Value Ref Range  ? Cholesterol, Total 185 100 - 199 mg/dL  ? Triglycerides 110 0 - 149 mg/dL  ? HDL 48 >39 mg/dL  ? VLDL Cholesterol Cal 20 5 - 40 mg/dL  ? LDL Chol Calc (NIH) 117 (H) 0 - 99 mg/dL  ?TSH  ?Result Value Ref Range  ? TSH 1.600 0.450 - 4.500 uIU/mL  ?Urinalysis, Routine w reflex microscopic  ?Result Value Ref Range  ? Specific Gravity, UA 1.020 1.005 - 1.030  ? pH, UA 5.0 5.0 - 7.5  ? Color, UA Yellow Yellow  ? Appearance Ur Clear Clear  ? Leukocytes,UA Negative Negative  ? Protein,UA Negative Negative/Trace  ? Glucose, UA Negative Negative  ? Ketones, UA Negative Negative  ? RBC, UA 2+ (A) Negative  ? Bilirubin, UA Negative Negative  ? Urobilinogen, Ur 0.2 0.2 - 1.0 mg/dL  ? Nitrite, UA Negative Negative  ? Microscopic Examination See below:   ?Bayer DCA Hb A1c Waived  ?Result Value Ref Range  ? HB A1C (BAYER DCA - WAIVED) 6.0 (H) 4.8 - 5.6 %  ? ?   ? ? ?Current Outpatient Medications:  ?  cephALEXin (KEFLEX) 500 MG capsule, Take 1 capsule (500 mg total) by mouth 2 (two) times  daily for 5 days., Disp: 10 capsule, Rfl: 0 ?  ergocalciferol (VITAMIN D2) 1.25 MG (50000 UT) capsule, Take 1 capsule by mouth once a week., Disp: , Rfl:  ?  fluconazole (DIFLUCAN) 150 MG tablet, Take 1  tablet (150 mg total) by mouth daily for 3 days., Disp: 3 tablet, Rfl: 0 ?  mometasone (ELOCON) 0.1 % cream, Apply 1 application topically daily as needed (Rash). For eczema of abdomen, Disp: 45 g, Rfl: 3 ?  pyridoxine (B-6) 250 MG tablet, Take 250 mg by mouth daily., Disp: , Rfl:  ?  ezetimibe (ZETIA) 10 MG tablet, Take 10 mg by mouth daily. (Patient not taking: Reported on 05/02/2021), Disp: , Rfl:  ?  molnupiravir EUA (LAGEVRIO) 200 mg CAPS capsule, Take 4 capsules (800 mg total) by mouth 2 (two) times daily for 5 days. (Patient not taking: Reported on 05/06/2021), Disp: 40 capsule, Rfl: 0  ? ? ?Assessment & Plan:  ?Rash ? Tinea corporis ?Will start pt on antigfungals ?To use otc powder keep areas dry  ?Use calamine lotion fo ritching ?Problem List Items Addressed This Visit   ? ?  ? Musculoskeletal and Integument  ? Tinea corporis - Primary  ? Relevant Medications  ? fluconazole (DIFLUCAN) 150 MG tablet  ? cephALEXin (KEFLEX) 500 MG capsule  ?  ? ?No orders of the defined types were placed in this encounter. ?  ? ?Meds ordered this encounter  ?Medications  ? fluconazole (DIFLUCAN) 150 MG tablet  ?  Sig: Take 1 tablet (150 mg total) by mouth daily for 3 days.  ?  Dispense:  3 tablet  ?  Refill:  0  ? cephALEXin (KEFLEX) 500 MG capsule  ?  Sig: Take 1 capsule (500 mg total) by mouth 2 (two) times daily for 5 days.  ?  Dispense:  10 capsule  ?  Refill:  0  ?  ? ?Follow up plan: ?Return in about 4 days (around 05/10/2021). ? ? ?

## 2021-05-10 ENCOUNTER — Ambulatory Visit (INDEPENDENT_AMBULATORY_CARE_PROVIDER_SITE_OTHER): Payer: BC Managed Care – PPO | Admitting: Family Medicine

## 2021-05-10 ENCOUNTER — Encounter: Payer: Self-pay | Admitting: Family Medicine

## 2021-05-10 VITALS — BP 106/61 | HR 67 | Temp 97.5°F | Wt 194.4 lb

## 2021-05-10 DIAGNOSIS — R21 Rash and other nonspecific skin eruption: Secondary | ICD-10-CM | POA: Diagnosis not present

## 2021-05-10 DIAGNOSIS — U071 COVID-19: Secondary | ICD-10-CM

## 2021-05-10 DIAGNOSIS — M25512 Pain in left shoulder: Secondary | ICD-10-CM | POA: Diagnosis not present

## 2021-05-10 NOTE — Progress Notes (Signed)
? ?BP 106/61   Pulse 67   Temp (!) 97.5 ?F (36.4 ?C)   Wt 194 lb 6.4 oz (88.2 kg)   SpO2 97%   BMI 26.92 kg/m?   ? ?Subjective:  ? ? Patient ID: [name], male    DOB: 08-29-1945, 76 y.o.   MRN: [id number] ? ?HPI: ?Darius Williams is a 76 y.o. male ? ?Chief Complaint  ?Patient presents with  ? Rash  ?  Patient is here to follow up on rash, patient states it is getting better  ? ?RASH ?Duration:  days  ?Location: groin, armpits  ?Itching: yes ?Burning: no ?Redness: no ?Oozing: no ?Scaling: yes ?Blisters: no ?Painful: no ?Fevers: no ?Change in detergents/soaps/personal care products: no ?Recent illness: yes ?Recent travel:no ?History of same: no ?Context: better ?Alleviating factors: keflex, diflucan ?Shortness of breath: no  ?Throat/tongue swelling: no ?Myalgias/arthralgias: no ? ?SHOULDER PAIN ?Duration: weeks ?Involved shoulder: left ?Mechanism of injury: unknown ?Location: anterior ?Onset:gradual ?Severity: moderate  ?Quality:  shooting and aching ?Frequency: intermittent ?Radiation: yes ?Aggravating factors: lifting, movement, and sleep  ?Alleviating factors: nothing  ?Status: stable ?Treatments attempted: rest, ice, heat, APAP, ibuprofen, and aleve  ?Relief with NSAIDs?:  mild ?Weakness: no ?Numbness: yes ?Decreased grip strength: no ?Redness: no ?Swelling: no ?Bruising: no ?Fevers: no ? ? ?Relevant past medical, surgical, family and social history reviewed and updated as indicated. Interim medical history since our last visit reviewed. ?Allergies and medications reviewed and updated. ? ?Review of Systems  ?Constitutional: Negative.   ?Respiratory: Negative.    ?Cardiovascular: Negative.   ?Gastrointestinal: Negative.   ?Musculoskeletal: Negative.   ?Skin:  Positive for rash. Negative for color change, pallor and wound.  ?Psychiatric/Behavioral: Negative.    ? ?Per HPI unless specifically indicated above ? ?   ?Objective:  ?  ?BP 106/61   Pulse 67   Temp (!) 97.5 ?F (36.4 ?C)   Wt 194 lb 6.4 oz (88.2  kg)   SpO2 97%   BMI 26.92 kg/m?   ?Wt Readings from Last 3 Encounters:  ?05/10/21 194 lb 6.4 oz (88.2 kg)  ?05/06/21 197 lb 3.2 oz (89.4 kg)  ?05/02/21 194 lb 9.6 oz (88.3 kg)  ?  ?Physical Exam ?Vitals and nursing note reviewed.  ?Constitutional:   ?   General: He is not in acute distress. ?   Appearance: Normal appearance. He is not ill-appearing, toxic-appearing or diaphoretic.  ?HENT:  ?   Head: Normocephalic and atraumatic.  ?   Right Ear: External ear normal.  ?   Left Ear: External ear normal.  ?   Nose: Nose normal.  ?   Mouth/Throat:  ?   Mouth: Mucous membranes are moist.  ?   Pharynx: Oropharynx is clear.  ?Eyes:  ?   General: No scleral icterus.    ?   Right eye: No discharge.     ?   Left eye: No discharge.  ?   Extraocular Movements: Extraocular movements intact.  ?   Conjunctiva/sclera: Conjunctivae normal.  ?   Pupils: Pupils are equal, round, and reactive to light.  ?Cardiovascular:  ?   Rate and Rhythm: Normal rate and regular rhythm.  ?   Pulses: Normal pulses.  ?   Heart sounds: Normal heart sounds. No murmur heard. ?  No friction rub. No gallop.  ?Pulmonary:  ?   Effort: Pulmonary effort is normal. No respiratory distress.  ?   Breath sounds: Normal breath sounds. No stridor. No wheezing, rhonchi or rales.  ?  Chest:  ?   Chest wall: No tenderness.  ?Musculoskeletal:     ?   General: Normal range of motion.  ?   Cervical back: Normal range of motion and neck supple.  ?Skin: ?   General: Skin is warm and dry.  ?   Capillary Refill: Capillary refill takes less than 2 seconds.  ?   Coloration: Skin is not jaundiced or pale.  ?   Findings: No bruising, erythema, lesion or rash.  ? ?    ?Neurological:  ?   General: No focal deficit present.  ?   Mental Status: He is alert and oriented to person, place, and time. Mental status is at baseline.  ?Psychiatric:     ?   Mood and Affect: Mood normal.     ?   Behavior: Behavior normal.     ?   Thought Content: Thought content normal.     ?   Judgment:  Judgment normal.  ? ? ?Results for orders placed or performed in visit on 10/04/20  ?Microscopic Examination  ? Urine  ?Result Value Ref Range  ? WBC, UA 0-5 0 - 5 /hpf  ? RBC 3-10 (A) 0 - 2 /hpf  ? Epithelial Cells (non renal) 0-10 0 - 10 /hpf  ? Bacteria, UA None seen None seen/Few  ?Comprehensive metabolic panel  ?Result Value Ref Range  ? Glucose 92 70 - 99 mg/dL  ? BUN 12 8 - 27 mg/dL  ? Creatinine, Ser 0.84 0.76 - 1.27 mg/dL  ? eGFR 91 >59 mL/min/1.73  ? BUN/Creatinine Ratio 14 10 - 24  ? Sodium 138 134 - 144 mmol/L  ? Potassium 4.0 3.5 - 5.2 mmol/L  ? Chloride 99 96 - 106 mmol/L  ? CO2 25 20 - 29 mmol/L  ? Calcium 9.2 8.6 - 10.2 mg/dL  ? Total Protein 7.0 6.0 - 8.5 g/dL  ? Albumin 4.8 (H) 3.7 - 4.7 g/dL  ? Globulin, Total 2.2 1.5 - 4.5 g/dL  ? Albumin/Globulin Ratio 2.2 1.2 - 2.2  ? Bilirubin Total 0.8 0.0 - 1.2 mg/dL  ? Alkaline Phosphatase 79 44 - 121 IU/L  ? AST 26 0 - 40 IU/L  ? ALT 25 0 - 44 IU/L  ?CBC with Differential/Platelet  ?Result Value Ref Range  ? WBC 4.8 3.4 - 10.8 x10E3/uL  ? RBC 4.43 4.14 - 5.80 x10E6/uL  ? Hemoglobin 14.3 13.0 - 17.7 g/dL  ? Hematocrit 42.4 37.5 - 51.0 %  ? MCV 96 79 - 97 fL  ? MCH 32.3 26.6 - 33.0 pg  ? MCHC 33.7 31.5 - 35.7 g/dL  ? RDW 12.4 11.6 - 15.4 %  ? Platelets 123 (L) 150 - 450 x10E3/uL  ? Neutrophils 57 Not Estab. %  ? Lymphs 29 Not Estab. %  ? Monocytes 10 Not Estab. %  ? Eos 3 Not Estab. %  ? Basos 1 Not Estab. %  ? Neutrophils Absolute 2.8 1.4 - 7.0 x10E3/uL  ? Lymphocytes Absolute 1.4 0.7 - 3.1 x10E3/uL  ? Monocytes Absolute 0.5 0.1 - 0.9 x10E3/uL  ? EOS (ABSOLUTE) 0.1 0.0 - 0.4 x10E3/uL  ? Basophils Absolute 0.0 0.0 - 0.2 x10E3/uL  ? Immature Granulocytes 0 Not Estab. %  ? Immature Grans (Abs) 0.0 0.0 - 0.1 x10E3/uL  ?Lipid Panel w/o Chol/HDL Ratio  ?Result Value Ref Range  ? Cholesterol, Total 185 100 - 199 mg/dL  ? Triglycerides 110 0 - 149 mg/dL  ? HDL 48 >39 mg/dL  ?  VLDL Cholesterol Cal 20 5 - 40 mg/dL  ? LDL Chol Calc (NIH) 117 (H) 0 - 99 mg/dL  ?TSH   ?Result Value Ref Range  ? TSH 1.600 0.450 - 4.500 uIU/mL  ?Urinalysis, Routine w reflex microscopic  ?Result Value Ref Range  ? Specific Gravity, UA 1.020 1.005 - 1.030  ? pH, UA 5.0 5.0 - 7.5  ? Color, UA Yellow Yellow  ? Appearance Ur Clear Clear  ? Leukocytes,UA Negative Negative  ? Protein,UA Negative Negative/Trace  ? Glucose, UA Negative Negative  ? Ketones, UA Negative Negative  ? RBC, UA 2+ (A) Negative  ? Bilirubin, UA Negative Negative  ? Urobilinogen, Ur 0.2 0.2 - 1.0 mg/dL  ? Nitrite, UA Negative Negative  ? Microscopic Examination See below:   ?Bayer DCA Hb A1c Waived  ?Result Value Ref Range  ? HB A1C (BAYER DCA - WAIVED) 6.0 (H) 4.8 - 5.6 %  ? ?   ?Assessment & Plan:  ? ?Problem List Items Addressed This Visit   ? ?  ? Other  ? COVID-19  ?  On day 10. Encouraged rest and easing back into activities. Call with any concerns. Recheck about 2-3 weeks. ? ?  ?  ? ?Other Visit Diagnoses   ? ? Rash    -  Primary  ? 90% resolved. ?Contact derm vs viral [place]. Will treat with mometasone he has at home. Call if not getting better or getting worse. Recheck 2-3 weeks  ? Acute pain of left shoulder      ? Will start stretches and if not getting better get x-rays of neck and shoulder. Await results.   ? Relevant Orders  ? DG Shoulder Left  ? DG Cervical Spine Complete  ? ?  ?  ? ?Follow up plan: ?Return in about 3 weeks (around 05/31/2021). ? ? ? ? ? ?

## 2021-05-10 NOTE — Assessment & Plan Note (Signed)
On day 10. Encouraged rest and easing back into activities. Call with any concerns. Recheck about 2-3 weeks. ?

## 2021-05-10 NOTE — Patient Instructions (Signed)
Vitamin D3 2000-3000 IU daily ?

## 2021-05-31 ENCOUNTER — Ambulatory Visit: Payer: BC Managed Care – PPO | Admitting: Family Medicine

## 2021-05-31 ENCOUNTER — Encounter: Payer: Self-pay | Admitting: Family Medicine

## 2021-05-31 VITALS — BP 110/64 | HR 67 | Temp 98.0°F | Wt 193.8 lb

## 2021-05-31 DIAGNOSIS — I77810 Thoracic aortic ectasia: Secondary | ICD-10-CM

## 2021-05-31 DIAGNOSIS — E538 Deficiency of other specified B group vitamins: Secondary | ICD-10-CM | POA: Diagnosis not present

## 2021-05-31 DIAGNOSIS — U071 COVID-19: Secondary | ICD-10-CM

## 2021-05-31 DIAGNOSIS — R7301 Impaired fasting glucose: Secondary | ICD-10-CM | POA: Diagnosis not present

## 2021-05-31 DIAGNOSIS — M25512 Pain in left shoulder: Secondary | ICD-10-CM

## 2021-05-31 DIAGNOSIS — E559 Vitamin D deficiency, unspecified: Secondary | ICD-10-CM

## 2021-05-31 DIAGNOSIS — E7800 Pure hypercholesterolemia, unspecified: Secondary | ICD-10-CM

## 2021-05-31 NOTE — Assessment & Plan Note (Signed)
Rechecking labs today. Await results. Treat as needed.  °

## 2021-05-31 NOTE — Assessment & Plan Note (Signed)
Feeling back to himself. No further symptoms.

## 2021-05-31 NOTE — Assessment & Plan Note (Signed)
Will continue to monitor. Will keep cholesterol under good control. Continue to monitor. Call with any concerns.

## 2021-05-31 NOTE — Progress Notes (Signed)
BP 110/64   Pulse 67   Temp 98 F (36.7 C) (Oral)   Wt 193 lb 12.8 oz (87.9 kg)   SpO2 97%   BMI 26.83 kg/m    Subjective:    Patient ID: Darius Williams, male    DOB: 1945-08-04, 76 y.o.   MRN: [id number]  HPI: Darius Williams is a 76 y.o. male  Chief Complaint  Patient presents with   Shoulder Pain    3 week f/up   Hyperlipidemia   ifg   Shoulder pain has resolved. Feeling back to himself from [place]. No continued symptoms.   HYPERLIPIDEMIA Hyperlipidemia status: stable Satisfied with current treatment?  yes Side effects:  no Medication compliance:  not on anything Past cholesterol meds: zetia Supplements: fish oil Aspirin:  no The 10-year ASCVD risk score (Arnett DK, et al., 2019) is: 19.3%   Values used to calculate the score:     Age: 30 years     Sex: Male     Is Non-Hispanic African American: No     Diabetic: No     Tobacco smoker: No     Systolic Blood Pressure: 767 mmHg     Is BP treated: No     HDL Cholesterol: 48 mg/dL     Total Cholesterol: 185 mg/dL Chest pain:  no Coronary artery disease:  no  Impaired Fasting Glucose HbA1C:  Lab Results  Component Value Date   HGBA1C 6.0 (H) 10/04/2020   Duration of elevated blood sugar: chronic Polydipsia: no Polyuria: no Weight change: no Visual disturbance: no Glucose Monitoring: no Diabetic Education: Not Completed Family history of diabetes: no  Relevant past medical, surgical, family and social history reviewed and updated as indicated. Interim medical history since our last visit reviewed. Allergies and medications reviewed and updated.  Review of Systems  Constitutional: Negative.   Respiratory: Negative.    Cardiovascular: Negative.   Gastrointestinal: Negative.   Musculoskeletal: Negative.   Psychiatric/Behavioral: Negative.     Per HPI unless specifically indicated above     Objective:    BP 110/64   Pulse 67   Temp 98 F (36.7 C) (Oral)   Wt 193 lb 12.8 oz (87.9 kg)   SpO2 97%    BMI 26.83 kg/m   Wt Readings from Last 3 Encounters:  05/31/21 193 lb 12.8 oz (87.9 kg)  05/10/21 194 lb 6.4 oz (88.2 kg)  05/06/21 197 lb 3.2 oz (89.4 kg)    Physical Exam Vitals and nursing note reviewed.  Constitutional:      General: He is not in acute distress.    Appearance: Normal appearance. He is normal weight. He is not ill-appearing, toxic-appearing or diaphoretic.  HENT:     Head: Normocephalic and atraumatic.     Right Ear: External ear normal.     Left Ear: External ear normal.     Nose: Nose normal.     Mouth/Throat:     Mouth: Mucous membranes are moist.     Pharynx: Oropharynx is clear.  Eyes:     General: No scleral icterus.       Right eye: No discharge.        Left eye: No discharge.     Extraocular Movements: Extraocular movements intact.     Conjunctiva/sclera: Conjunctivae normal.     Pupils: Pupils are equal, round, and reactive to light.  Cardiovascular:     Rate and Rhythm: Normal rate and regular rhythm.     Pulses: Normal pulses.  Heart sounds: Normal heart sounds. No murmur heard.   No friction rub. No gallop.  Pulmonary:     Effort: Pulmonary effort is normal. No respiratory distress.     Breath sounds: Normal breath sounds. No stridor. No wheezing, rhonchi or rales.  Chest:     Chest wall: No tenderness.  Musculoskeletal:        General: Normal range of motion.     Cervical back: Normal range of motion and neck supple.  Skin:    General: Skin is warm and dry.     Capillary Refill: Capillary refill takes less than 2 seconds.     Coloration: Skin is not jaundiced or pale.     Findings: No bruising, erythema, lesion or rash.  Neurological:     General: No focal deficit present.     Mental Status: He is alert and oriented to person, place, and time. Mental status is at baseline.  Psychiatric:        Mood and Affect: Mood normal.        Behavior: Behavior normal.        Thought Content: Thought content normal.        Judgment:  Judgment normal.    Results for orders placed or performed in visit on 10/04/20  Microscopic Examination   Urine  Result Value Ref Range   WBC, UA 0-5 0 - 5 /hpf   RBC 3-10 (A) 0 - 2 /hpf   Epithelial Cells (non renal) 0-10 0 - 10 /hpf   Bacteria, UA None seen None seen/Few  Comprehensive metabolic panel  Result Value Ref Range   Glucose 92 70 - 99 mg/dL   BUN 12 8 - 27 mg/dL   Creatinine, Ser 0.84 0.76 - 1.27 mg/dL   eGFR 91 >59 mL/min/1.73   BUN/Creatinine Ratio 14 10 - 24   Sodium 138 134 - 144 mmol/L   Potassium 4.0 3.5 - 5.2 mmol/L   Chloride 99 96 - 106 mmol/L   CO2 25 20 - 29 mmol/L   Calcium 9.2 8.6 - 10.2 mg/dL   Total Protein 7.0 6.0 - 8.5 g/dL   Albumin 4.8 (H) 3.7 - 4.7 g/dL   Globulin, Total 2.2 1.5 - 4.5 g/dL   Albumin/Globulin Ratio 2.2 1.2 - 2.2   Bilirubin Total 0.8 0.0 - 1.2 mg/dL   Alkaline Phosphatase 79 44 - 121 IU/L   AST 26 0 - 40 IU/L   ALT 25 0 - 44 IU/L  CBC with Differential/Platelet  Result Value Ref Range   WBC 4.8 3.4 - 10.8 x10E3/uL   RBC 4.43 4.14 - 5.80 x10E6/uL   Hemoglobin 14.3 13.0 - 17.7 g/dL   Hematocrit 42.4 37.5 - 51.0 %   MCV 96 79 - 97 fL   MCH 32.3 26.6 - 33.0 pg   MCHC 33.7 31.5 - 35.7 g/dL   RDW 12.4 11.6 - 15.4 %   Platelets 123 (L) 150 - 450 x10E3/uL   Neutrophils 57 Not Estab. %   Lymphs 29 Not Estab. %   Monocytes 10 Not Estab. %   Eos 3 Not Estab. %   Basos 1 Not Estab. %   Neutrophils Absolute 2.8 1.4 - 7.0 x10E3/uL   Lymphocytes Absolute 1.4 0.7 - 3.1 x10E3/uL   Monocytes Absolute 0.5 0.1 - 0.9 x10E3/uL   EOS (ABSOLUTE) 0.1 0.0 - 0.4 x10E3/uL   Basophils Absolute 0.0 0.0 - 0.2 x10E3/uL   Immature Granulocytes 0 Not Estab. %   Immature Grans (Abs)  0.0 0.0 - 0.1 x10E3/uL  Lipid Panel w/o Chol/HDL Ratio  Result Value Ref Range   Cholesterol, Total 185 100 - 199 mg/dL   Triglycerides 110 0 - 149 mg/dL   HDL 48 >39 mg/dL   VLDL Cholesterol Cal 20 5 - 40 mg/dL   LDL Chol Calc (NIH) 117 (H) 0 - 99 mg/dL  TSH   Result Value Ref Range   TSH 1.600 0.450 - 4.500 uIU/mL  Urinalysis, Routine w reflex microscopic  Result Value Ref Range   Specific Gravity, UA 1.020 1.005 - 1.030   pH, UA 5.0 5.0 - 7.5   Color, UA Yellow Yellow   Appearance Ur Clear Clear   Leukocytes,UA Negative Negative   Protein,UA Negative Negative/Trace   Glucose, UA Negative Negative   Ketones, UA Negative Negative   RBC, UA 2+ (A) Negative   Bilirubin, UA Negative Negative   Urobilinogen, Ur 0.2 0.2 - 1.0 mg/dL   Nitrite, UA Negative Negative   Microscopic Examination See below:   Bayer DCA Hb A1c Waived  Result Value Ref Range   HB A1C (BAYER DCA - WAIVED) 6.0 (H) 4.8 - 5.6 %      Assessment & Plan:   Problem List Items Addressed This Visit       Cardiovascular and Mediastinum   Dilated aortic root ([place])    Will continue to monitor. Will keep cholesterol under good control. Continue to monitor. Call with any concerns.          Endocrine   IFG (impaired fasting glucose)    Rechecking labs today. Await results. Treat as needed.        Relevant Orders   Hgb A1c w/o eAG     Other   Hypercholesteremia - Primary    Rechecking labs today. Await results. Treat as needed.        Relevant Orders   Comprehensive metabolic panel   Lipid Panel w/o Chol/HDL Ratio   COVID-19    Feeling back to himself. No further symptoms.        Vitamin D deficiency    Rechecking labs today. Await results. Treat as needed.        Relevant Orders   VITAMIN D 25 Hydroxy (Vit-D Deficiency, Fractures)   B12 deficiency    Rechecking labs today. Await results. Treat as needed.        Relevant Orders   B12   Other Visit Diagnoses     Acute pain of left shoulder       Resolved.         Follow up plan: Return in about 6 months (around 12/01/2021) for physical.

## 2021-06-01 LAB — COMPREHENSIVE METABOLIC PANEL
ALT: 21 IU/L (ref 0–44)
AST: 22 IU/L (ref 0–40)
Albumin/Globulin Ratio: 1.8 (ref 1.2–2.2)
Albumin: 4.5 g/dL (ref 3.7–4.7)
Alkaline Phosphatase: 71 IU/L (ref 44–121)
BUN/Creatinine Ratio: 11 (ref 10–24)
BUN: 10 mg/dL (ref 8–27)
Bilirubin Total: 0.7 mg/dL (ref 0.0–1.2)
CO2: 25 mmol/L (ref 20–29)
Calcium: 9.2 mg/dL (ref 8.6–10.2)
Chloride: 102 mmol/L (ref 96–106)
Creatinine, Ser: 0.93 mg/dL (ref 0.76–1.27)
Globulin, Total: 2.5 g/dL (ref 1.5–4.5)
Glucose: 134 mg/dL — ABNORMAL HIGH (ref 70–99)
Potassium: 4.2 mmol/L (ref 3.5–5.2)
Sodium: 140 mmol/L (ref 134–144)
Total Protein: 7 g/dL (ref 6.0–8.5)
eGFR: 86 mL/min/{1.73_m2} (ref >59)

## 2021-06-01 LAB — HGB A1C W/O EAG: Hgb A1c MFr Bld: 6.6 % — ABNORMAL HIGH (ref 4.8–5.6)

## 2021-06-01 LAB — LIPID PANEL W/O CHOL/HDL RATIO
Cholesterol, Total: 182 mg/dL (ref 100–199)
HDL: 44 mg/dL (ref >39)
LDL Chol Calc (NIH): 114 mg/dL — ABNORMAL HIGH (ref 0–99)
Triglycerides: 132 mg/dL (ref 0–149)
VLDL Cholesterol Cal: 24 mg/dL (ref 5–40)

## 2021-06-01 LAB — VITAMIN D 25 HYDROXY (VIT D DEFICIENCY, FRACTURES): Vit D, 25-Hydroxy: 29.2 ng/mL — ABNORMAL LOW (ref 30.0–100.0)

## 2021-06-01 LAB — VITAMIN B12: Vitamin B-12: 901 pg/mL (ref 232–1245)

## 2021-06-02 ENCOUNTER — Encounter: Payer: Self-pay | Admitting: Family Medicine

## 2021-10-05 ENCOUNTER — Encounter: Payer: Self-pay | Admitting: Family Medicine

## 2021-10-05 ENCOUNTER — Ambulatory Visit (INDEPENDENT_AMBULATORY_CARE_PROVIDER_SITE_OTHER): Payer: BC Managed Care – PPO | Admitting: Family Medicine

## 2021-10-05 VITALS — BP 103/66 | HR 61 | Temp 97.9°F | Ht 71.26 in | Wt 180.9 lb

## 2021-10-05 DIAGNOSIS — Z1211 Encounter for screening for malignant neoplasm of colon: Secondary | ICD-10-CM

## 2021-10-05 DIAGNOSIS — Z0000 Encounter for general adult medical examination without abnormal findings: Secondary | ICD-10-CM | POA: Diagnosis not present

## 2021-10-05 DIAGNOSIS — Z23 Encounter for immunization: Secondary | ICD-10-CM | POA: Diagnosis not present

## 2021-10-05 DIAGNOSIS — E7800 Pure hypercholesterolemia, unspecified: Secondary | ICD-10-CM

## 2021-10-05 DIAGNOSIS — E1140 Type 2 diabetes mellitus with diabetic neuropathy, unspecified: Secondary | ICD-10-CM

## 2021-10-05 DIAGNOSIS — E559 Vitamin D deficiency, unspecified: Secondary | ICD-10-CM

## 2021-10-05 DIAGNOSIS — E538 Deficiency of other specified B group vitamins: Secondary | ICD-10-CM | POA: Diagnosis not present

## 2021-10-05 LAB — MICROSCOPIC EXAMINATION
Bacteria, UA: NONE SEEN
Epithelial Cells (non renal): NONE SEEN /hpf (ref 0–10)

## 2021-10-05 LAB — URINALYSIS, ROUTINE W REFLEX MICROSCOPIC
Bilirubin, UA: NEGATIVE
Glucose, UA: NEGATIVE
Ketones, UA: NEGATIVE
Leukocytes,UA: NEGATIVE
Nitrite, UA: NEGATIVE
Protein,UA: NEGATIVE
Specific Gravity, UA: >1.03 — ABNORMAL HIGH (ref 1.005–1.030)
Urobilinogen, Ur: 1 mg/dL (ref 0.2–1.0)
pH, UA: 6 (ref 5.0–7.5)

## 2021-10-05 LAB — MICROALBUMIN, URINE WAIVED
Creatinine, Urine Waived: 200 mg/dL (ref 10–300)
Microalb, Ur Waived: 30 mg/L — ABNORMAL HIGH (ref 0–19)
Microalb/Creat Ratio: <30 mg/g (ref <30)

## 2021-10-05 LAB — BAYER DCA HB A1C WAIVED: HB A1C (BAYER DCA - WAIVED): 5.6 % (ref 4.8–5.6)

## 2021-10-05 MED ORDER — EZETIMIBE 10 MG PO TABS: 10.0000 mg | ORAL_TABLET | Freq: Every day | ORAL | 1 refills | Status: DC

## 2021-10-05 NOTE — Assessment & Plan Note (Signed)
Doing well with a1c down to 5.6. Continue diet and exercise. Recheck 6 months.

## 2021-10-05 NOTE — Assessment & Plan Note (Signed)
Rechecking labs today. Await results. Treat as needed.  °

## 2021-10-05 NOTE — Progress Notes (Signed)
BP 103/66   Pulse 61   Temp 97.9 F (36.6 C) (Oral)   Ht 5' 11.26" (1.81 m)   Wt 180 lb 14.4 oz (82.1 kg)   SpO2 98%   BMI 25.05 kg/m    Subjective:    Patient ID: Darius Williams, male    DOB: 05-Jul-1945, 76 y.o.   MRN: [id number]  HPI: Darius Williams is a 76 y.o. male presenting on 10/05/2021 for comprehensive medical examination. Current medical complaints include:  DIABETES Hypoglycemic episodes:no Polydipsia/polyuria: no Visual disturbance: no Chest pain: no Paresthesias: no Glucose Monitoring: no  Accucheck frequency: Not Checking Taking Insulin?: no Blood Pressure Monitoring: not checking Retinal Examination: Up to Date Foot Exam: Up to Date Diabetic Education: Completed Pneumovax: Up to Date Influenza: Up to Date Aspirin: no  HYPERLIPIDEMIA- stopped his medicine Hyperlipidemia status:  stable Satisfied with current treatment?  no Side effects:  no Medication compliance: poor compliance Past cholesterol meds: zetia Supplements: none Aspirin:  no The 10-year ASCVD risk score (Arnett DK, et al., 2019) is: 33.4%   Values used to calculate the score:     Age: 40 years     Sex: Male     Is Non-Hispanic African American: No     Diabetic: Yes     Tobacco smoker: No     Systolic Blood Pressure: 269 mmHg     Is BP treated: No     HDL Cholesterol: 44 mg/dL     Total Cholesterol: 182 mg/dL Chest pain:  no Coronary artery disease:  no  He currently lives with: wife Interim Problems from his last visit: no  Depression Screen done today and results listed below:     10/05/2021    8:07 AM 05/06/2021    1:48 PM 02/22/2021    8:24 AM 10/04/2020    1:03 PM 12/05/2019    9:14 AM  Depression screen PHQ 2/9  Decreased Interest 0 0 0 0 0  Down, Depressed, Hopeless '1 1 1 '$ 0 0  PHQ - 2 Score '1 1 1 '$ 0 0  Altered sleeping '1 2 2 1   '$ Tired, decreased energy '2 1 3 1   '$ Change in appetite 0 0 2 0   Feeling bad or failure about yourself  0 0 1 0   Trouble concentrating 0 0 0 0    Moving slowly or fidgety/restless 0 0 1 0   Suicidal thoughts 0 0 0 0   PHQ-9 Score '4 4 10 2   '$ Difficult doing work/chores Not difficult at all Not difficult at all Not difficult at all Not difficult at all     Past Medical History:  Past Medical History:  Diagnosis Date   Actinic keratosis 05/01/2013   mid forehead - bx proven, with HPV related changes   BPH (benign prostatic hyperplasia) 03/18/2015   Cancer ([place])    SKIN CANCER FOREHEAD   Fatigue 03/18/2015   History of kidney stones    H/O   Hypercholesteremia 03/18/2015   Personal history of kidney stones 07/15/2014   Tinnitus 06/13/2017    Surgical History:  Past Surgical History:  Procedure Laterality Date   CHOLECYSTECTOMY     CYSTOSCOPY WITH LITHOLAPAXY N/A 10/30/2017   Procedure: CYSTOSCOPY WITH LITHOLAPAXY;  Surgeon: Royston Cowper, MD;  Location: [facility];  Service: Urology;  Laterality: N/A;   GREEN LIGHT LASER TURP (TRANSURETHRAL RESECTION OF PROSTATE N/A 10/30/2017   Procedure: GREEN LIGHT LASER TURP (TRANSURETHRAL RESECTION OF PROSTATE;  Surgeon: Maryan Puls  R, MD;  Location: [facility];  Service: Urology;  Laterality: N/A;   HOLMIUM LASER APPLICATION N/A 13/08/6576   Procedure: HOLMIUM LASER APPLICATION;  Surgeon: Royston Cowper, MD;  Location: [facility];  Service: Urology;  Laterality: N/A;   LITHOTRIPSY  09/12/2007   LITHOTRIPSY  11/07/2007    Medications:  Current Outpatient Medications on File Prior to Visit  Medication Sig   Alpha-Lipoic Acid 100 MG CAPS Take by mouth.   Cholecalciferol (VITAMIN D3) 25 MCG (1000 UT) CAPS Take 1 capsule by mouth daily.   Multiple Vitamin (MULTIVITAMIN) tablet Take 1 tablet by mouth daily.   No current facility-administered medications on file prior to visit.    Allergies:  Allergies  Allergen Reactions   Other     TRISCUITS-RASH   Penicillin G Benzathine Rash    Has patient had a PCN reaction causing immediate rash, facial/tongue/throat swelling, SOB or  lightheadedness with hypotension: Yes Has patient had a PCN reaction causing severe rash involving mucus membranes or skin necrosis: No Has patient had a PCN reaction that required hospitalization: No Has patient had a PCN reaction occurring within the last 10 years: No If all of the above answers are "NO", then may proceed with Cephalosporin use.     Social History:  Social History   Socioeconomic History   Marital status: Married    Spouse name: Not on file   Number of children: Not on file   Years of education: Not on file   Highest education level: Not on file  Occupational History   Not on file  Tobacco Use   Smoking status: Never   Smokeless tobacco: Never  Vaping Use   Vaping Use: Never used  Substance and Sexual Activity   Alcohol use: No    Alcohol/week: 0.0 standard drinks of alcohol   Drug use: No   Sexual activity: Never  Other Topics Concern   Not on file  Social History Narrative   Not on file   Social Determinants of Health   Financial Resource Strain: Low Risk  (03/22/2017)   Overall Financial Resource Strain (CARDIA)    Difficulty of Paying Living Expenses: Not hard at all  Food Insecurity: No Food Insecurity (03/22/2017)   Hunger Vital Sign    Worried About Running Out of Food in the Last Year: Never true    [place] in the Last Year: Never true  Transportation Needs: No Transportation Needs (03/22/2017)   PRAPARE - Hydrologist (Medical): No    Lack of Transportation (Non-Medical): No  Physical Activity: Inactive (03/22/2017)   Exercise Vital Sign    Days of Exercise per Week: 0 days    Minutes of Exercise per Session: 0 min  Stress: No Stress Concern Present (03/22/2017)   [place]    Feeling of Stress : Not at all  Social Connections: [place] (03/22/2017)   Social Connection and Isolation Panel [NHANES]    Frequency of Communication  with Friends and Family: More than three times a week    Frequency of Social Gatherings with Friends and Family: More than three times a week    Attends Religious Services: More than 4 times per year    Active Member of [employer] or Organizations: Yes    Attends Archivist Meetings: Not on file    Marital Status: Married  Intimate Partner Violence: Not At Risk (03/22/2017)   Humiliation, Afraid, Rape,  and Kick questionnaire    Fear of Current or Ex-Partner: No    Emotionally Abused: No    Physically Abused: No    Sexually Abused: No   Social History   Tobacco Use  Smoking Status Never  Smokeless Tobacco Never   Social History   Substance and Sexual Activity  Alcohol Use No   Alcohol/week: 0.0 standard drinks of alcohol    Family History:  Family History  Problem Relation Age of Onset   Cancer Mother        lung   Cancer Sister        breast   Cancer Father        prostate and skin   Varicose Veins Son    Cancer Paternal Grandmother        brain    Past medical history, surgical history, medications, allergies, family history and social history reviewed with patient today and changes made to appropriate areas of the chart.   Review of Systems  Constitutional: Negative.   HENT:  Positive for tinnitus. Negative for congestion, ear discharge, ear pain, hearing loss, nosebleeds, sinus pain and sore throat.   Eyes: Negative.        Visual disturbance- following with eye doctor   Respiratory: Negative.  Negative for stridor.   Cardiovascular: Negative.   Gastrointestinal:  Positive for constipation. Negative for abdominal pain, blood in stool, diarrhea, heartburn, melena, nausea and vomiting.  Genitourinary: Negative.   Musculoskeletal:  Positive for joint pain. Negative for back pain, falls, myalgias and neck pain.  Skin: Negative.   Neurological: Negative.   Endo/Heme/Allergies: Negative.   Psychiatric/Behavioral:  Negative for depression, hallucinations,  memory loss, substance abuse and suicidal ideas. The patient is nervous/anxious. The patient does not have insomnia.    All other ROS negative except what is listed above and in the HPI.      Objective:    BP 103/66   Pulse 61   Temp 97.9 F (36.6 C) (Oral)   Ht 5' 11.26" (1.81 m)   Wt 180 lb 14.4 oz (82.1 kg)   SpO2 98%   BMI 25.05 kg/m   Wt Readings from Last 3 Encounters:  10/05/21 180 lb 14.4 oz (82.1 kg)  05/31/21 193 lb 12.8 oz (87.9 kg)  05/10/21 194 lb 6.4 oz (88.2 kg)    Physical Exam Vitals and nursing note reviewed.  Constitutional:      General: He is not in acute distress.    Appearance: Normal appearance. He is normal weight. He is not ill-appearing, toxic-appearing or diaphoretic.  HENT:     Head: Normocephalic and atraumatic.     Right Ear: Tympanic membrane, ear canal and external ear normal. There is no impacted cerumen.     Left Ear: Tympanic membrane, ear canal and external ear normal. There is no impacted cerumen.     Nose: Nose normal. No congestion or rhinorrhea.     Mouth/Throat:     Mouth: Mucous membranes are moist.     Pharynx: Oropharynx is clear. No oropharyngeal exudate or posterior oropharyngeal erythema.  Eyes:     General: No scleral icterus.       Right eye: No discharge.        Left eye: No discharge.     Extraocular Movements: Extraocular movements intact.     Conjunctiva/sclera: Conjunctivae normal.     Pupils: Pupils are equal, round, and reactive to light.  Neck:     Vascular: No carotid bruit.  Cardiovascular:  Rate and Rhythm: Normal rate and regular rhythm.     Pulses: Normal pulses.     Heart sounds: No murmur heard.    No friction rub. No gallop.  Pulmonary:     Effort: Pulmonary effort is normal. No respiratory distress.     Breath sounds: Normal breath sounds. No stridor. No wheezing, rhonchi or rales.  Chest:     Chest wall: No tenderness.  Abdominal:     General: Abdomen is flat. Bowel sounds are normal. There is  no distension.     Palpations: Abdomen is soft. There is no mass.     Tenderness: There is no abdominal tenderness. There is no right CVA tenderness, left CVA tenderness, guarding or rebound.     Hernia: No hernia is present.  Genitourinary:    Comments: Genital exam deferred with shared decision making Musculoskeletal:        General: No swelling, tenderness, deformity or signs of injury.     Cervical back: Normal range of motion and neck supple. No rigidity. No muscular tenderness.     Right lower leg: No edema.     Left lower leg: No edema.  Lymphadenopathy:     Cervical: No cervical adenopathy.  Skin:    General: Skin is warm and dry.     Capillary Refill: Capillary refill takes less than 2 seconds.     Coloration: Skin is not jaundiced or pale.     Findings: No bruising, erythema, lesion or rash.  Neurological:     General: No focal deficit present.     Mental Status: He is alert and oriented to person, place, and time.     Cranial Nerves: No cranial nerve deficit.     Sensory: No sensory deficit.     Motor: No weakness.     Coordination: Coordination normal.     Gait: Gait normal.     Deep Tendon Reflexes: Reflexes normal.  Psychiatric:        Mood and Affect: Mood normal.        Behavior: Behavior normal.        Thought Content: Thought content normal.        Judgment: Judgment normal.     Results for orders placed or performed in visit on 10/05/21  Microscopic Examination   BLD  Result Value Ref Range   WBC, UA 0-5 0 - 5 /hpf   RBC, Urine 0-2 0 - 2 /hpf   Epithelial Cells (non renal) None seen 0 - 10 /hpf   Bacteria, UA None seen None seen/Few  Bayer DCA Hb A1c Waived  Result Value Ref Range   HB A1C (BAYER DCA - WAIVED) 5.6 4.8 - 5.6 %  Microalbumin, Urine Waived  Result Value Ref Range   Microalb, Ur Waived 30 (H) 0 - 19 mg/L   Creatinine, Urine Waived 200 10 - 300 mg/dL   Microalb/Creat Ratio <30 <30 mg/g  Urinalysis, Routine w reflex microscopic  Result  Value Ref Range   Specific Gravity, UA >1.030 (H) 1.005 - 1.030   pH, UA 6.0 5.0 - 7.5   Color, UA Yellow Yellow   Appearance Ur Clear Clear   Leukocytes,UA Negative Negative   Protein,UA Negative Negative/Trace   Glucose, UA Negative Negative   Ketones, UA Negative Negative   RBC, UA Trace (A) Negative   Bilirubin, UA Negative Negative   Urobilinogen, Ur 1.0 0.2 - 1.0 mg/dL   Nitrite, UA Negative Negative   Microscopic Examination See below:  Assessment & Plan:   Problem List Items Addressed This Visit       Endocrine   Type 2 diabetes mellitus with diabetic neuropathy, unspecified ([place])    Doing well with a1c down to 5.6. Continue diet and exercise. Recheck 6 months.       Relevant Orders   Bayer DCA Hb A1c Waived (Completed)   Microalbumin, Urine Waived (Completed)   Comprehensive metabolic panel   CBC with Differential/Platelet   TSH   Urinalysis, Routine w reflex microscopic (Completed)     Other   Hypercholesteremia    Would like to restart his zetia. Rx sent to his pharmacy. Labs drawn today. Await results.        Relevant Medications   ezetimibe (ZETIA) 10 MG tablet   Other Relevant Orders   Comprehensive metabolic panel   Lipid Panel w/o Chol/HDL Ratio   Vitamin D deficiency    Rechecking labs today. Await results. Treat as needed.       Relevant Orders   VITAMIN D 25 Hydroxy (Vit-D Deficiency, Fractures)   B12 deficiency    Rechecking labs today. Await results. Treat as needed.       Relevant Orders   CBC with Differential/Platelet   B12   Other Visit Diagnoses     Routine general medical examination at a health care facility    -  Primary   Vaccines up to date. Screening labs checked today. Colonoscopy ordered today. Continue diet and exercise. Call with any concerns.    Screening for colon cancer       Referral to GI placed today.   Relevant Orders   Ambulatory referral to Gastroenterology   Needs flu shot       Flu shot given today.    Relevant Orders   Flu Vaccine QUAD High Dose(Fluad) (Completed)        LABORATORY TESTING:  Health maintenance labs ordered today as discussed above.   IMMUNIZATIONS:   - Tdap: Tetanus vaccination status reviewed: last tetanus booster within 10 years. - Influenza: Administered today - Pneumovax: Up to date - Prevnar: Up to date - COVID: Up to date - HPV: Not applicable - Shingrix vaccine: ordered today  SCREENING: - Colonoscopy: Ordered today  Discussed with patient purpose of the colonoscopy is to detect colon cancer at curable precancerous or early stages   PATIENT COUNSELING:    Sexuality: Discussed sexually transmitted diseases, partner selection, use of condoms, avoidance of unintended pregnancy  and contraceptive alternatives.   Advised to avoid cigarette smoking.  I discussed with the patient that most people either abstain from alcohol or drink within safe limits (<=14/week and <=4 drinks/occasion for males, <=7/weeks and <= 3 drinks/occasion for females) and that the risk for alcohol disorders and other health effects rises proportionally with the number of drinks per week and how often a drinker exceeds daily limits.  Discussed cessation/primary prevention of drug use and availability of treatment for abuse.   Diet: Encouraged to adjust caloric intake to maintain  or achieve ideal body weight, to reduce intake of dietary saturated fat and total fat, to limit sodium intake by avoiding high sodium foods and not adding table salt, and to maintain adequate dietary potassium and calcium preferably from fresh fruits, vegetables, and low-fat dairy products.    stressed the importance of regular exercise  Injury prevention: Discussed safety belts, safety helmets, smoke detector, smoking near bedding or upholstery.   Dental health: Discussed importance of regular tooth brushing, flossing, and dental  visits.   Follow up plan: NEXT PREVENTATIVE PHYSICAL DUE IN 1  YEAR. Return in about 6 months (around 04/06/2022) for 2 weeks nurse only for shingrix #1 and 3 months after that for shingrix #2.

## 2021-10-05 NOTE — Assessment & Plan Note (Signed)
Would like to restart his zetia. Rx sent to his pharmacy. Labs drawn today. Await results.

## 2021-10-06 LAB — COMPREHENSIVE METABOLIC PANEL
ALT: 18 IU/L (ref 0–44)
AST: 22 IU/L (ref 0–40)
Albumin/Globulin Ratio: 2 (ref 1.2–2.2)
Albumin: 4.4 g/dL (ref 3.8–4.8)
Alkaline Phosphatase: 71 IU/L (ref 44–121)
BUN/Creatinine Ratio: 16 (ref 10–24)
BUN: 14 mg/dL (ref 8–27)
Bilirubin Total: 0.6 mg/dL (ref 0.0–1.2)
CO2: 26 mmol/L (ref 20–29)
Calcium: 8.9 mg/dL (ref 8.6–10.2)
Chloride: 102 mmol/L (ref 96–106)
Creatinine, Ser: 0.86 mg/dL (ref 0.76–1.27)
Globulin, Total: 2.2 g/dL (ref 1.5–4.5)
Glucose: 111 mg/dL — ABNORMAL HIGH (ref 70–99)
Potassium: 3.8 mmol/L (ref 3.5–5.2)
Sodium: 141 mmol/L (ref 134–144)
Total Protein: 6.6 g/dL (ref 6.0–8.5)
eGFR: 90 mL/min/{1.73_m2} (ref >59)

## 2021-10-06 LAB — CBC WITH DIFFERENTIAL/PLATELET
Basophils Absolute: 0 10*3/uL (ref 0.0–0.2)
Basos: 1 %
EOS (ABSOLUTE): 0.1 10*3/uL (ref 0.0–0.4)
Eos: 3 %
Hematocrit: 39 % (ref 37.5–51.0)
Hemoglobin: 13.4 g/dL (ref 13.0–17.7)
Immature Grans (Abs): 0 10*3/uL (ref 0.0–0.1)
Immature Granulocytes: 0 %
Lymphocytes Absolute: 1 10*3/uL (ref 0.7–3.1)
Lymphs: 23 %
MCH: 33.4 pg — ABNORMAL HIGH (ref 26.6–33.0)
MCHC: 34.4 g/dL (ref 31.5–35.7)
MCV: 97 fL (ref 79–97)
Monocytes Absolute: 0.4 10*3/uL (ref 0.1–0.9)
Monocytes: 10 %
Neutrophils Absolute: 2.8 10*3/uL (ref 1.4–7.0)
Neutrophils: 63 %
Platelets: 128 10*3/uL — ABNORMAL LOW (ref 150–450)
RBC: 4.01 x10E6/uL — ABNORMAL LOW (ref 4.14–5.80)
RDW: 13 % (ref 11.6–15.4)
WBC: 4.3 10*3/uL (ref 3.4–10.8)

## 2021-10-06 LAB — LIPID PANEL W/O CHOL/HDL RATIO
Cholesterol, Total: 155 mg/dL (ref 100–199)
HDL: 46 mg/dL (ref >39)
LDL Chol Calc (NIH): 94 mg/dL (ref 0–99)
Triglycerides: 75 mg/dL (ref 0–149)
VLDL Cholesterol Cal: 15 mg/dL (ref 5–40)

## 2021-10-06 LAB — VITAMIN D 25 HYDROXY (VIT D DEFICIENCY, FRACTURES): Vit D, 25-Hydroxy: 46.1 ng/mL (ref 30.0–100.0)

## 2021-10-06 LAB — VITAMIN B12: Vitamin B-12: 793 pg/mL (ref 232–1245)

## 2021-10-06 LAB — TSH: TSH: 1.88 u[IU]/mL (ref 0.450–4.500)

## 2021-10-19 ENCOUNTER — Ambulatory Visit (INDEPENDENT_AMBULATORY_CARE_PROVIDER_SITE_OTHER): Payer: BC Managed Care – PPO

## 2021-10-19 DIAGNOSIS — Z23 Encounter for immunization: Secondary | ICD-10-CM | POA: Diagnosis not present

## 2021-10-31 ENCOUNTER — Encounter (INDEPENDENT_AMBULATORY_CARE_PROVIDER_SITE_OTHER): Payer: Self-pay

## 2022-01-10 ENCOUNTER — Ambulatory Visit (INDEPENDENT_AMBULATORY_CARE_PROVIDER_SITE_OTHER): Payer: BC Managed Care – PPO

## 2022-01-10 DIAGNOSIS — Z23 Encounter for immunization: Secondary | ICD-10-CM

## 2022-01-11 ENCOUNTER — Ambulatory Visit: Payer: BC Managed Care – PPO

## 2022-01-16 ENCOUNTER — Encounter: Payer: Self-pay | Admitting: Family Medicine

## 2022-01-16 ENCOUNTER — Ambulatory Visit (INDEPENDENT_AMBULATORY_CARE_PROVIDER_SITE_OTHER): Payer: BC Managed Care – PPO | Admitting: Family Medicine

## 2022-01-16 ENCOUNTER — Ambulatory Visit: Payer: BC Managed Care – PPO | Admitting: Family Medicine

## 2022-01-16 ENCOUNTER — Ambulatory Visit: Payer: Self-pay | Admitting: *Deleted

## 2022-01-16 VITALS — BP 101/63 | HR 69 | Temp 98.4°F | Ht 71.0 in | Wt 179.9 lb

## 2022-01-16 DIAGNOSIS — R051 Acute cough: Secondary | ICD-10-CM | POA: Diagnosis not present

## 2022-01-16 LAB — VERITOR FLU A/B WAIVED
Influenza A: NEGATIVE
Influenza B: NEGATIVE

## 2022-01-16 MED ORDER — PREDNISONE 50 MG PO TABS: 50.0000 mg | ORAL_TABLET | Freq: Every day | ORAL | 0 refills | Status: DC

## 2022-01-16 MED ORDER — TRIAMCINOLONE ACETONIDE 40 MG/ML IJ SUSP
40.0000 mg | Freq: Once | INTRAMUSCULAR | Status: AC
  Administered 2022-01-16: 40 mg via INTRAMUSCULAR

## 2022-01-16 MED ORDER — BENZONATATE 200 MG PO CAPS: 200.0000 mg | ORAL_CAPSULE | Freq: Two times a day (BID) | ORAL | 0 refills | Status: DC | PRN

## 2022-01-16 NOTE — Progress Notes (Unsigned)
Virtual Visit via Telephone Note  I connected with DAYVEN LINSLEY on 01/16/22 at  1:20 PM EST by telephone and verified that I am speaking with the correct person using two identifiers.  Location: Patient: *** Provider: ***   I discussed the limitations, risks, security and privacy concerns of performing an evaluation and management service by telephone and the availability of in person appointments. I also discussed with the patient that there may be a patient responsible charge related to this service. The patient expressed understanding and agreed to proceed.   History of Present Illness:  .    Observations/Objective:   Assessment and Plan:      I discussed the assessment and treatment plan with the patient. The patient was provided an opportunity to ask questions and all were answered. The patient agreed with the plan and demonstrated an understanding of the instructions.   The patient was advised to call back or seek an in-person evaluation if the symptoms worsen or if the condition fails to improve as anticipated.  I provided *** minutes of non-face-to-face time during this encounter.   Myles Gip, DO

## 2022-01-16 NOTE — Telephone Encounter (Signed)
  Chief Complaint: cough Symptoms: cough, congestion Frequency: Friday Pertinent Negatives: Patient denies fever Disposition: '[]'$ ED /'[]'$ Urgent Care (no appt availability in office) / '[x]'$ Appointment(In office/virtual)/ '[]'$  [place] [facility]/ '[]'$ Home Care/ '[]'$ Refused Recommended Disposition /'[]'$ [facility]/ '[]'$  Follow-up with PCP Additional Notes: Appointment scheduled

## 2022-01-16 NOTE — Progress Notes (Signed)
BP 101/63   Pulse 69   Temp 98.4 F (36.9 C) (Oral)   Ht '5\' 11"'$  (1.803 m)   Wt 179 lb 14.4 oz (81.6 kg)   SpO2 98%   BMI 25.09 kg/m    Subjective:    Patient ID: Darius Williams, male    DOB: 17-Jul-1945, 77 y.o.   MRN: [id number]  HPI: Darius Williams is a 77 y.o. male  Chief Complaint  Patient presents with   Cough    Patient says he became symptomatic on Friday and says he has been coughing up phlegm. Patient says he has been taking an old prescription for Doxycycline. Patient says he woke up and make this appointment. Patient declines having a fever, or any shortness of breath. Patient says he has cough to the point to where his chest is sore.    UPPER RESPIRATORY TRACT INFECTION Duration: 4 days Worst symptom: stuffy and runny nose, cough Fever: no Cough: yes Shortness of breath: no Wheezing: no Chest pain: no Chest tightness: yes Chest congestion: yes Nasal congestion: yes Runny nose: yes Post nasal drip: yes Sneezing: no Sore throat: no Swollen glands: no Sinus pressure: no Headache: no Face pain: no Toothache: no Ear pain: no  Ear pressure: no  Eyes red/itching:no Eye drainage/crusting: no  Vomiting: no Rash: no Fatigue: yes Sick contacts: yes Strep contacts: no  Context: better Recurrent sinusitis: no Relief with OTC cold/cough medications: no  Treatments attempted: doxycycline   Relevant past medical, surgical, family and social history reviewed and updated as indicated. Interim medical history since our last visit reviewed. Allergies and medications reviewed and updated.  Review of Systems  Constitutional: Negative.   HENT:  Positive for congestion, postnasal drip and rhinorrhea. Negative for dental problem, drooling, ear discharge, ear pain, facial swelling, hearing loss, mouth sores, nosebleeds, sinus pressure, sinus pain, sneezing, sore throat, tinnitus, trouble swallowing and voice change.   Respiratory:  Positive for cough and shortness of  breath. Negative for apnea, choking, chest tightness, wheezing and stridor.   Cardiovascular: Negative.   Gastrointestinal: Negative.   Musculoskeletal: Negative.   Psychiatric/Behavioral: Negative.      Per HPI unless specifically indicated above     Objective:    BP 101/63   Pulse 69   Temp 98.4 F (36.9 C) (Oral)   Ht '5\' 11"'$  (1.803 m)   Wt 179 lb 14.4 oz (81.6 kg)   SpO2 98%   BMI 25.09 kg/m   Wt Readings from Last 3 Encounters:  01/16/22 179 lb 14.4 oz (81.6 kg)  10/05/21 180 lb 14.4 oz (82.1 kg)  05/31/21 193 lb 12.8 oz (87.9 kg)    Physical Exam Vitals and nursing note reviewed.  Constitutional:      General: He is not in acute distress.    Appearance: Normal appearance. He is normal weight. He is not ill-appearing, toxic-appearing or diaphoretic.  HENT:     Head: Normocephalic and atraumatic.     Right Ear: External ear normal. There is impacted cerumen.     Left Ear: Tympanic membrane, ear canal and external ear normal.     Nose: Rhinorrhea present. No congestion.     Mouth/Throat:     Mouth: Mucous membranes are moist.     Pharynx: Oropharynx is clear. No oropharyngeal exudate or posterior oropharyngeal erythema.  Eyes:     General: No scleral icterus.       Right eye: No discharge.        Left eye:  No discharge.     Extraocular Movements: Extraocular movements intact.     Conjunctiva/sclera: Conjunctivae normal.     Pupils: Pupils are equal, round, and reactive to light.  Cardiovascular:     Rate and Rhythm: Normal rate and regular rhythm.     Pulses: Normal pulses.     Heart sounds: Normal heart sounds. No murmur heard.    No friction rub. No gallop.  Pulmonary:     Effort: Pulmonary effort is normal. No respiratory distress.     Breath sounds: Normal breath sounds. No stridor. No wheezing, rhonchi or rales.  Chest:     Chest wall: No tenderness.  Musculoskeletal:        General: Normal range of motion.     Cervical back: Normal range of motion and  neck supple.  Skin:    General: Skin is warm and dry.     Capillary Refill: Capillary refill takes less than 2 seconds.     Coloration: Skin is not jaundiced or pale.     Findings: No bruising, erythema, lesion or rash.  Neurological:     General: No focal deficit present.     Mental Status: He is alert and oriented to person, place, and time. Mental status is at baseline.  Psychiatric:        Mood and Affect: Mood normal.        Behavior: Behavior normal.        Thought Content: Thought content normal.        Judgment: Judgment normal.     Results for orders placed or performed in visit on 10/05/21  Microscopic Examination   BLD  Result Value Ref Range   WBC, UA 0-5 0 - 5 /hpf   RBC, Urine 0-2 0 - 2 /hpf   Epithelial Cells (non renal) None seen 0 - 10 /hpf   Bacteria, UA None seen None seen/Few  Bayer DCA Hb A1c Waived  Result Value Ref Range   HB A1C (BAYER DCA - WAIVED) 5.6 4.8 - 5.6 %  Microalbumin, Urine Waived  Result Value Ref Range   Microalb, Ur Waived 30 (H) 0 - 19 mg/L   Creatinine, Urine Waived 200 10 - 300 mg/dL   Microalb/Creat Ratio <30 <30 mg/g  Comprehensive metabolic panel  Result Value Ref Range   Glucose 111 (H) 70 - 99 mg/dL   BUN 14 8 - 27 mg/dL   Creatinine, Ser 0.86 0.76 - 1.27 mg/dL   eGFR 90 >59 mL/min/1.73   BUN/Creatinine Ratio 16 10 - 24   Sodium 141 134 - 144 mmol/L   Potassium 3.8 3.5 - 5.2 mmol/L   Chloride 102 96 - 106 mmol/L   CO2 26 20 - 29 mmol/L   Calcium 8.9 8.6 - 10.2 mg/dL   Total Protein 6.6 6.0 - 8.5 g/dL   Albumin 4.4 3.8 - 4.8 g/dL   Globulin, Total 2.2 1.5 - 4.5 g/dL   Albumin/Globulin Ratio 2.0 1.2 - 2.2   Bilirubin Total 0.6 0.0 - 1.2 mg/dL   Alkaline Phosphatase 71 44 - 121 IU/L   AST 22 0 - 40 IU/L   ALT 18 0 - 44 IU/L  CBC with Differential/Platelet  Result Value Ref Range   WBC 4.3 3.4 - 10.8 x10E3/uL   RBC 4.01 (L) 4.14 - 5.80 x10E6/uL   Hemoglobin 13.4 13.0 - 17.7 g/dL   Hematocrit 39.0 37.5 - 51.0 %   MCV  97 79 - 97 fL   MCH 33.4 (  H) 26.6 - 33.0 pg   MCHC 34.4 31.5 - 35.7 g/dL   RDW 13.0 11.6 - 15.4 %   Platelets 128 (L) 150 - 450 x10E3/uL   Neutrophils 63 Not Estab. %   Lymphs 23 Not Estab. %   Monocytes 10 Not Estab. %   Eos 3 Not Estab. %   Basos 1 Not Estab. %   Neutrophils Absolute 2.8 1.4 - 7.0 x10E3/uL   Lymphocytes Absolute 1.0 0.7 - 3.1 x10E3/uL   Monocytes Absolute 0.4 0.1 - 0.9 x10E3/uL   EOS (ABSOLUTE) 0.1 0.0 - 0.4 x10E3/uL   Basophils Absolute 0.0 0.0 - 0.2 x10E3/uL   Immature Granulocytes 0 Not Estab. %   Immature Grans (Abs) 0.0 0.0 - 0.1 x10E3/uL  Lipid Panel w/o Chol/HDL Ratio  Result Value Ref Range   Cholesterol, Total 155 100 - 199 mg/dL   Triglycerides 75 0 - 149 mg/dL   HDL 46 >39 mg/dL   VLDL Cholesterol Cal 15 5 - 40 mg/dL   LDL Chol Calc (NIH) 94 0 - 99 mg/dL  TSH  Result Value Ref Range   TSH 1.880 0.450 - 4.500 uIU/mL  Urinalysis, Routine w reflex microscopic  Result Value Ref Range   Specific Gravity, UA >1.030 (H) 1.005 - 1.030   pH, UA 6.0 5.0 - 7.5   Color, UA Yellow Yellow   Appearance Ur Clear Clear   Leukocytes,UA Negative Negative   Protein,UA Negative Negative/Trace   Glucose, UA Negative Negative   Ketones, UA Negative Negative   RBC, UA Trace (A) Negative   Bilirubin, UA Negative Negative   Urobilinogen, Ur 1.0 0.2 - 1.0 mg/dL   Nitrite, UA Negative Negative   Microscopic Examination See below:   VITAMIN D 25 Hydroxy (Vit-D Deficiency, Fractures)  Result Value Ref Range   Vit D, 25-Hydroxy 46.1 30.0 - 100.0 ng/mL  B12  Result Value Ref Range   Vitamin B-12 793 232 - 1,245 pg/mL      Assessment & Plan:   Problem List Items Addressed This Visit   None Visit Diagnoses     Acute cough    -  Primary   Flu negative. Will treat with prednisone and tessalon. Call if not getting better or getting worse.   Relevant Medications   triamcinolone acetonide (KENALOG-40) injection 40 mg (Start on 01/16/2022  3:15 PM)   Other Relevant  Orders   Veritor Flu A/B Waived   Novel Coronavirus, NAA ([facility])        Follow up plan: Return as scheduled.

## 2022-01-16 NOTE — Progress Notes (Signed)
error 

## 2022-01-16 NOTE — Telephone Encounter (Signed)
Summary: Congestion/ cough   The patient has been feeling bad for a little while now with a cough, and congestion. The patient states he has coughed up a lot of snot. He states he has been taking some old medicine but would like to speak with a nurse. Please assist patient further         Reason for Disposition  SEVERE coughing spells (e.g., whooping sound after coughing, vomiting after coughing)  Answer Assessment - Initial Assessment Questions 1. ONSET: "When did the cough begin?"      Friday night 2. SEVERITY: "How bad is the cough today?"      Productive, spasm 3. SPUTUM: "Describe the color of your sputum" (none, dry cough; clear, white, yellow, green)     yellow 4. HEMOPTYSIS: "Are you coughing up any blood?" If so ask: "How much?" (flecks, streaks, tablespoons, etc.)     no 5. DIFFICULTY BREATHING: "Are you having difficulty breathing?" If Yes, ask: "How bad is it?" (e.g., mild, moderate, severe)    - MILD: No SOB at rest, mild SOB with walking, speaks normally in sentences, can lie down, no retractions, pulse < 100.    - MODERATE: SOB at rest, SOB with minimal exertion and prefers to sit, cannot lie down flat, speaks in phrases, mild retractions, audible wheezing, pulse 100-120.    - SEVERE: Very SOB at rest, speaks in single words, struggling to breathe, sitting hunched forward, retractions, pulse > 120      normal 6. FEVER: "Do you have a fever?" If Yes, ask: "What is your temperature, how was it measured, and when did it start?"     no  10. OTHER SYMPTOMS: "Do you have any other symptoms?" (e.g., runny nose, wheezing, chest pain)       Runny nose, coughing cause chest pain  Protocols used: Cough - Acute Productive-A-AH

## 2022-01-17 LAB — NOVEL CORONAVIRUS, NAA: SARS-CoV-2, NAA: DETECTED — AB

## 2022-02-16 ENCOUNTER — Ambulatory Visit: Payer: BC Managed Care – PPO | Admitting: Dermatology

## 2022-03-02 ENCOUNTER — Ambulatory Visit (INDEPENDENT_AMBULATORY_CARE_PROVIDER_SITE_OTHER): Payer: BC Managed Care – PPO | Admitting: Dermatology

## 2022-03-02 VITALS — BP 107/58 | HR 68

## 2022-03-02 DIAGNOSIS — Z872 Personal history of diseases of the skin and subcutaneous tissue: Secondary | ICD-10-CM

## 2022-03-02 DIAGNOSIS — Z1283 Encounter for screening for malignant neoplasm of skin: Secondary | ICD-10-CM | POA: Diagnosis not present

## 2022-03-02 DIAGNOSIS — L578 Other skin changes due to chronic exposure to nonionizing radiation: Secondary | ICD-10-CM | POA: Diagnosis not present

## 2022-03-02 DIAGNOSIS — L821 Other seborrheic keratosis: Secondary | ICD-10-CM

## 2022-03-02 DIAGNOSIS — D229 Melanocytic nevi, unspecified: Secondary | ICD-10-CM

## 2022-03-02 DIAGNOSIS — L820 Inflamed seborrheic keratosis: Secondary | ICD-10-CM

## 2022-03-02 DIAGNOSIS — L814 Other melanin hyperpigmentation: Secondary | ICD-10-CM

## 2022-03-02 DIAGNOSIS — D1801 Hemangioma of skin and subcutaneous tissue: Secondary | ICD-10-CM

## 2022-03-02 NOTE — Progress Notes (Signed)
   Follow-Up Visit   Subjective  Darius Williams is a 77 y.o. male who presents for the following: Annual Exam. The patient presents for Total-Body Skin Exam (TBSE) for skin cancer screening and mole check.  The patient has spots, moles and lesions to be evaluated, some may be new or changing and the patient has concerns that these could be cancer.   The following portions of the chart were reviewed this encounter and updated as appropriate:   Tobacco  Allergies  Meds  Problems  Med Hx  Surg Hx  Fam Hx     Review of Systems:  No other skin or systemic complaints except as noted in HPI or Assessment and Plan.  Objective  Well appearing patient in no apparent distress; mood and affect are within normal limits.  A full examination was performed including scalp, head, eyes, ears, nose, lips, neck, chest, axillae, abdomen, back, buttocks, bilateral upper extremities, bilateral lower extremities, hands, feet, fingers, toes, fingernails, and toenails. All findings within normal limits unless otherwise noted below.  left dorsum hand x 2 (2) Stuck-on, waxy, tan-brown papule -- Discussed benign etiology and prognosis.   face Clear    Assessment & Plan  Inflamed seborrheic keratosis (2) left dorsum hand x 2 Symptomatic, irritating, patient would like treated.  Destruction of lesion - left dorsum hand x 2 Complexity: simple   Destruction method: cryotherapy   Informed consent: discussed and consent obtained   Timeout:  patient name, date of birth, surgical site, and procedure verified Lesion destroyed using liquid nitrogen: Yes   Region frozen until ice ball extended beyond lesion: Yes   Outcome: patient tolerated procedure well with no complications   Post-procedure details: wound care instructions given    History of actinic keratosis face Clear; Observe   Lentigines - Scattered tan macules - Due to sun exposure - Benign-appearing, observe - Recommend daily broad spectrum  sunscreen SPF 30+ to sun-exposed areas, reapply every 2 hours as needed. - Call for any changes  Seborrheic Keratoses - Stuck-on, waxy, tan-brown papules and/or plaques  - Benign-appearing - Discussed benign etiology and prognosis. - Observe - Call for any changes  Melanocytic Nevi - Tan-brown and/or pink-flesh-colored symmetric macules and papules - Benign appearing on exam today - Observation - Call clinic for new or changing moles - Recommend daily use of broad spectrum spf 30+ sunscreen to sun-exposed areas.   Hemangiomas - Red papules - Discussed benign nature - Observe - Call for any changes  Actinic Damage - Chronic condition, secondary to cumulative UV/sun exposure - diffuse scaly erythematous macules with underlying dyspigmentation - Recommend daily broad spectrum sunscreen SPF 30+ to sun-exposed areas, reapply every 2 hours as needed.  - Staying in the shade or wearing long sleeves, sun glasses (UVA+UVB protection) and wide brim hats (4-inch brim around the entire circumference of the hat) are also recommended for sun protection.  - Call for new or changing lesions.  Skin cancer screening performed today.   Return in about 1 year (around 03/02/2023) for TBSE, hx of AKs .  IMarye Round, CMA, am acting as scribe for Sarina Ser, MD .  Documentation: I have reviewed the above documentation for accuracy and completeness, and I agree with the above.  Sarina Ser, MD

## 2022-03-02 NOTE — Patient Instructions (Addendum)
 Cryotherapy Aftercare  Wash gently with soap and water everyday.   Apply Vaseline and Band-Aid daily until healed. Due to recent changes in healthcare laws, you may see results of your pathology and/or laboratory studies on MyChart before the doctors have had a chance to review them. We understand that in some cases there may be results that are confusing or concerning to you. Please understand that not all results are received at the same time and often the doctors may need to interpret multiple results in order to provide you with the best plan of care or course of treatment. Therefore, we ask that you please give us 2 business days to thoroughly review all your results before contacting the office for clarification. Should we see a critical lab result, you will be contacted sooner.   If You Need Anything After Your Visit  If you have any questions or concerns for your doctor, please call our main line at [phone] and press option 4 to reach your doctor's medical assistant. If no one answers, please leave a voicemail as directed and we will return your call as soon as possible. Messages left after 4 pm will be answered the following business day.   You may also send us a message via MyChart. We typically respond to MyChart messages within 1-2 business days.  For prescription refills, please ask your pharmacy to contact our office. Our fax number is [phone].  If you have an urgent issue when the clinic is closed that cannot wait until the next business day, you can page your doctor at the number below.    Please note that while we do our best to be available for urgent issues outside of office hours, we are not available 24/7.   If you have an urgent issue and are unable to reach us, you may choose to seek medical care at your doctor's office, retail clinic, urgent care center, or emergency room.  If you have a medical emergency, please immediately call 911 or go to the emergency  department.  Pager Numbers  - Dr. Kowalski: [phone]  - Dr. Moye: [phone]  - Dr. Stewart: [phone]  In the event of inclement weather, please call our main line at [phone] for an update on the status of any delays or closures.  Dermatology Medication Tips: Please keep the boxes that topical medications come in in order to help keep track of the instructions about where and how to use these. Pharmacies typically print the medication instructions only on the boxes and not directly on the medication tubes.   If your medication is too expensive, please contact our office at [phone] option 4 or send us a message through MyChart.   We are unable to tell what your co-pay for medications will be in advance as this is different depending on your insurance coverage. However, we may be able to find a substitute medication at lower cost or fill out paperwork to get insurance to cover a needed medication.   If a prior authorization is required to get your medication covered by your insurance company, please allow us 1-2 business days to complete this process.  Drug prices often vary depending on where the prescription is filled and some pharmacies may offer cheaper prices.  The website www.goodrx.com contains coupons for medications through different pharmacies. The prices here do not account for what the cost may be with help from insurance (it may be cheaper with your insurance), but the website can give you the   price if you did not use any insurance.  - You can print the associated coupon and take it with your prescription to the pharmacy.  - You may also stop by our office during regular business hours and pick up a GoodRx coupon card.  - If you need your prescription sent electronically to a different pharmacy, notify our office through [facility] MyChart or by phone at [phone] option 4.     Si Usted Necesita Algo Despus de Su Visita  Tambin puede enviarnos un  mensaje a travs de MyChart. Por lo general respondemos a los mensajes de MyChart en el transcurso de 1 a 2 das hbiles.  Para renovar recetas, por favor pida a su farmacia que se ponga en contacto con nuestra oficina. Nuestro nmero de fax es el [phone].  Si tiene un asunto urgente cuando la clnica est cerrada y que no puede esperar hasta el siguiente da hbil, puede llamar/localizar a su doctor(a) al nmero que aparece a continuacin.   Por favor, tenga en cuenta que aunque hacemos todo lo posible para estar disponibles para asuntos urgentes fuera del horario de oficina, no estamos disponibles las 24 horas del da, los 7 das de la semana.   Si tiene un problema urgente y no puede comunicarse con nosotros, puede optar por buscar atencin mdica  en el consultorio de su doctor(a), en una clnica privada, en un centro de atencin urgente o en una sala de emergencias.  Si tiene una emergencia mdica, por favor llame inmediatamente al 911 o vaya a la sala de emergencias.  Nmeros de bper  - Dr. Kowalski: [phone]  - Dra. Moye: [phone]  - Dra. Stewart: [phone]  En caso de inclemencias del tiempo, por favor llame a nuestra lnea principal al [phone] para una actualizacin sobre el estado de cualquier retraso o cierre.  Consejos para la medicacin en dermatologa: Por favor, guarde las cajas en las que vienen los medicamentos de uso tpico para ayudarle a seguir las instrucciones sobre dnde y cmo usarlos. Las farmacias generalmente imprimen las instrucciones del medicamento slo en las cajas y no directamente en los tubos del medicamento.   Si su medicamento es muy caro, por favor, pngase en contacto con nuestra oficina llamando al [phone] y presione la opcin 4 o envenos un mensaje a travs de MyChart.   No podemos decirle cul ser su copago por los medicamentos por adelantado ya que esto es diferente dependiendo de la cobertura de su seguro. Sin embargo,  es posible que podamos encontrar un medicamento sustituto a menor costo o llenar un formulario para que el seguro cubra el medicamento que se considera necesario.   Si se requiere una autorizacin previa para que su compaa de seguros cubra su medicamento, por favor permtanos de 1 a 2 das hbiles para completar este proceso.  Los precios de los medicamentos varan con frecuencia dependiendo del lugar de dnde se surte la receta y alguna farmacias pueden ofrecer precios ms baratos.  El sitio web www.goodrx.com tiene cupones para medicamentos de diferentes farmacias. Los precios aqu no tienen en cuenta lo que podra costar con la ayuda del seguro (puede ser ms barato con su seguro), pero el sitio web puede darle el precio si no utiliz ningn seguro.  - Puede imprimir el cupn correspondiente y llevarlo con su receta a la farmacia.  - Tambin puede pasar por nuestra oficina durante el horario de atencin regular y recoger una tarjeta de cupones de GoodRx.  - Si necesita que   su receta se enve electrnicamente a una farmacia diferente, informe a nuestra oficina a travs de MyChart de [place] o por telfono llamando al [phone] y presione la opcin 4.  

## 2022-03-10 ENCOUNTER — Encounter: Payer: Self-pay | Admitting: Dermatology

## 2022-04-12 ENCOUNTER — Ambulatory Visit: Payer: BC Managed Care – PPO | Admitting: Family Medicine

## 2022-04-24 ENCOUNTER — Encounter: Payer: Self-pay | Admitting: Family Medicine

## 2022-04-24 ENCOUNTER — Ambulatory Visit: Payer: BC Managed Care – PPO | Admitting: Family Medicine

## 2022-04-24 ENCOUNTER — Telehealth: Payer: Self-pay

## 2022-04-24 VITALS — BP 101/62 | HR 66 | Temp 98.7°F | Wt 177.1 lb

## 2022-04-24 DIAGNOSIS — Z87442 Personal history of urinary calculi: Secondary | ICD-10-CM

## 2022-04-24 DIAGNOSIS — E7800 Pure hypercholesterolemia, unspecified: Secondary | ICD-10-CM | POA: Diagnosis not present

## 2022-04-24 DIAGNOSIS — Z0000 Encounter for general adult medical examination without abnormal findings: Secondary | ICD-10-CM | POA: Diagnosis not present

## 2022-04-24 DIAGNOSIS — E538 Deficiency of other specified B group vitamins: Secondary | ICD-10-CM

## 2022-04-24 DIAGNOSIS — N400 Enlarged prostate without lower urinary tract symptoms: Secondary | ICD-10-CM

## 2022-04-24 DIAGNOSIS — Z1211 Encounter for screening for malignant neoplasm of colon: Secondary | ICD-10-CM

## 2022-04-24 DIAGNOSIS — E1140 Type 2 diabetes mellitus with diabetic neuropathy, unspecified: Secondary | ICD-10-CM

## 2022-04-24 DIAGNOSIS — E559 Vitamin D deficiency, unspecified: Secondary | ICD-10-CM

## 2022-04-24 LAB — URINALYSIS, ROUTINE W REFLEX MICROSCOPIC
Bilirubin, UA: NEGATIVE
Glucose, UA: NEGATIVE
Ketones, UA: NEGATIVE
Leukocytes,UA: NEGATIVE
Nitrite, UA: NEGATIVE
Protein,UA: NEGATIVE
RBC, UA: NEGATIVE
Specific Gravity, UA: 1.02 (ref 1.005–1.030)
Urobilinogen, Ur: 0.2 mg/dL (ref 0.2–1.0)
pH, UA: 5.5 (ref 5.0–7.5)

## 2022-04-24 LAB — BAYER DCA HB A1C WAIVED: HB A1C (BAYER DCA - WAIVED): 6.1 % — ABNORMAL HIGH (ref 4.8–5.6)

## 2022-04-24 LAB — MICROALBUMIN, URINE WAIVED
Creatinine, Urine Waived: 50 mg/dL (ref 10–300)
Microalb, Ur Waived: 30 mg/L — ABNORMAL HIGH (ref 0–19)

## 2022-04-24 MED ORDER — EZETIMIBE 10 MG PO TABS: 10.0000 mg | ORAL_TABLET | Freq: Every day | ORAL | 1 refills | Status: DC

## 2022-04-24 NOTE — Assessment & Plan Note (Signed)
Doing great with A1c of 6.1. Continue diet and exercise. Call with any concerns.

## 2022-04-24 NOTE — Assessment & Plan Note (Signed)
Under good control on current regimen. Continue current regimen. Continue to monitor. Call with any concerns. Refills given. Labs drawn today.   

## 2022-04-24 NOTE — Telephone Encounter (Signed)
-----   Message from Dorcas Carrow, DO sent at 04/24/2022  1:25 PM EDT ----- [facility] for eye exam (probably not in the last year) Dr. Sharman Crate

## 2022-04-24 NOTE — Assessment & Plan Note (Signed)
No pain. Rechecking labs today. Await results. Treat as needed.

## 2022-04-24 NOTE — Progress Notes (Signed)
BP 101/62   Pulse 66   Temp 98.7 F (37.1 C) (Oral)   Wt 177 lb 1.6 oz (80.3 kg)   SpO2 98%   BMI 24.70 kg/m    Subjective:    Patient ID: Darius Williams, male    DOB: 07-29-1945, 77 y.o.   MRN: [id number]  HPI: Darius Williams is a 77 y.o. male presenting on 04/24/2022 for Annual Wellness examination. Current medical complaints include:  DIABETES Hypoglycemic episodes:no Polydipsia/polyuria: no Visual disturbance: no Chest pain: no Paresthesias: no Glucose Monitoring: no  Accucheck frequency: Not Checking Taking Insulin?: no Blood Pressure Monitoring: not checking Retinal Examination: Up to Date Foot Exam: Up to Date Diabetic Education: Completed Pneumovax: Up to Date Influenza: Up to Date Aspirin: yes  HYPERLIPIDEMIA Hyperlipidemia status: stable Satisfied with current treatment?  yes Side effects:  no Medication compliance: excellent compliance Past cholesterol meds: zetia Supplements: none Aspirin:  no The 10-year ASCVD risk score (Arnett DK, et al., 2019) is: 30.7%   Values used to calculate the score:     Age: 45 years     Sex: Male     Is Non-Hispanic African American: No     Diabetic: Yes     Tobacco smoker: No     Systolic Blood Pressure: 101 mmHg     Is BP treated: No     HDL Cholesterol: 46 mg/dL     Total Cholesterol: 155 mg/dL Chest pain:  no Coronary artery disease:  no  BPH BPH status: controlled Satisfied with current treatment?: yes Medication side effects: N/A Duration: chronic Nocturia: no Urinary frequency:no Incomplete voiding: no Urgency: no Weak urinary stream: no Straining to start stream: no Dysuria: no Onset: gradual Severity: mild  Interim Problems from his last visit: no  Functional Status Survey: Is the patient deaf or have difficulty hearing?: No Does the patient have difficulty seeing, even when wearing glasses/contacts?: Yes Does the patient have difficulty concentrating, remembering, or making decisions?:  Yes Does the patient have difficulty walking or climbing stairs?: No Does the patient have difficulty dressing or bathing?: No Does the patient have difficulty doing errands alone such as visiting a doctor's office or shopping?: No  FALL RISK:    04/24/2022    1:09 PM 01/16/2022    2:20 PM 10/05/2021    8:02 AM 05/06/2021    1:48 PM 10/04/2020    1:04 PM  Fall Risk   Falls in the past year? 0 0 Number falls in past yr: 0 0 1 0 0  Injury with Fall? 0 0 0 1 0  Risk for fall due to : No Fall Risks No Fall Risks No Fall Risks  No Fall Risks  Follow up Falls evaluation completed Falls evaluation completed Falls evaluation completed Falls evaluation completed Falls evaluation completed    Depression Screen    04/24/2022    1:10 PM 01/16/2022    2:20 PM 10/05/2021    8:07 AM 05/06/2021    1:48 PM 02/22/2021    8:24 AM  Depression screen PHQ 2/9  Decreased Interest 0 1 0 0 0  Down, Depressed, Hopeless 1 0 PHQ - 2 Score Altered sleeping Tired, decreased energy Change in appetite 0 1 0 0 2  Feeling bad or failure about yourself  0 1 0 0 1  Trouble concentrating 0 1 0  0 0  Moving slowly or fidgety/restless 0 1 0 0 1  Suicidal thoughts 0 0 0 0 0  PHQ-9 Score Difficult doing work/chores Not difficult at all Not difficult at all Not difficult at all Not difficult at all Not difficult at all    Advanced Directives Does patient have a HCPOA?    yes Does patient have a living will or MOST form?  yes  Past Medical History:  Past Medical History:  Diagnosis Date   Actinic keratosis 05/01/2013   mid forehead - bx proven, with HPV related changes   BPH (benign prostatic hyperplasia) 03/18/2015   Cancer    SKIN CANCER FOREHEAD   Fatigue 03/18/2015   History of kidney stones    H/O   Hypercholesteremia 03/18/2015   Personal history of kidney stones 07/15/2014   Tinnitus 06/13/2017    Surgical History:  Past Surgical History:   Procedure Laterality Date   CHOLECYSTECTOMY     CYSTOSCOPY WITH LITHOLAPAXY N/A 10/30/2017   Procedure: CYSTOSCOPY WITH LITHOLAPAXY;  Surgeon: Orson Ape, MD;  Location: [facility];  Service: Urology;  Laterality: N/A;   GREEN LIGHT LASER TURP (TRANSURETHRAL RESECTION OF PROSTATE N/A 10/30/2017   Procedure: GREEN LIGHT LASER TURP (TRANSURETHRAL RESECTION OF PROSTATE;  Surgeon: Orson Ape, MD;  Location: [facility];  Service: Urology;  Laterality: N/A;   HOLMIUM LASER APPLICATION N/A 10/30/2017   Procedure: HOLMIUM LASER APPLICATION;  Surgeon: Orson Ape, MD;  Location: [facility];  Service: Urology;  Laterality: N/A;   LITHOTRIPSY  09/12/2007   LITHOTRIPSY  11/07/2007    Medications:  Current Outpatient Medications on File Prior to Visit  Medication Sig   Alpha-Lipoic Acid 100 MG CAPS Take by mouth.   Cholecalciferol (VITAMIN D3) 25 MCG (1000 UT) CAPS Take 1 capsule by mouth daily.   No current facility-administered medications on file prior to visit.    Allergies:  Allergies  Allergen Reactions   Other     TRISCUITS-RASH   Penicillin G Benzathine Rash    Has patient had a PCN reaction causing immediate rash, facial/tongue/throat swelling, SOB or lightheadedness with hypotension: Yes Has patient had a PCN reaction causing severe rash involving mucus membranes or skin necrosis: No Has patient had a PCN reaction that required hospitalization: No Has patient had a PCN reaction occurring within the last 10 years: No If all of the above answers are "NO", then may proceed with Cephalosporin use.     Social History:  Social History   Socioeconomic History   Marital status: Married    Spouse name: Not on file   Number of children: Not on file   Years of education: Not on file   Highest education level: Not on file  Occupational History   Not on file  Tobacco Use   Smoking status: Never   Smokeless tobacco: Never  Vaping Use   Vaping Use: Never used  Substance  and Sexual Activity   Alcohol use: No    Alcohol/week: 0.0 standard drinks of alcohol   Drug use: No   Sexual activity: Never  Other Topics Concern   Not on file  Social History Narrative   Not on file   Social Determinants of Health   Financial Resource Strain: Low Risk  (03/22/2017)   Overall Financial Resource Strain (CARDIA)    Difficulty of Paying Living Expenses: Not hard at all  Food Insecurity: No Food Insecurity (03/22/2017)   Hunger Vital Sign  Worried About Programme researcher, broadcasting/film/video in the Last Year: Never true    Ran Out of Food in the Last Year: Never true  Transportation Needs: No Transportation Needs (03/22/2017)   PRAPARE - Administrator, Civil Service (Medical): No    Lack of Transportation (Non-Medical): No  Physical Activity: Inactive (03/22/2017)   Exercise Vital Sign    Days of Exercise per Week: 0 days    Minutes of Exercise per Session: 0 min  Stress: No Stress Concern Present (03/22/2017)   [employer] of Occupational Health - Occupational Stress Questionnaire    Feeling of Stress : Not at all  Social Connections: Socially Integrated (03/22/2017)   Social Connection and Isolation Panel [NHANES]    Frequency of Communication with Friends and Family: More than three times a week    Frequency of Social Gatherings with Friends and Family: More than three times a week    Attends Religious Services: More than 4 times per year    Active Member of [employer] or Organizations: Yes    Attends Banker Meetings: Not on file    Marital Status: Married  Intimate Partner Violence: Not At Risk (03/22/2017)   Humiliation, Afraid, Rape, and Kick questionnaire    Fear of Current or Ex-Partner: No    Emotionally Abused: No    Physically Abused: No    Sexually Abused: No   Social History   Tobacco Use  Smoking Status Never  Smokeless Tobacco Never   Social History   Substance and Sexual Activity  Alcohol Use No   Alcohol/week: 0.0 standard  drinks of alcohol    Family History:  Family History  Problem Relation Age of Onset   Cancer Mother        lung   Cancer Sister        breast   Cancer Father        prostate and skin   Varicose Veins Son    Cancer Paternal Grandmother        brain    Past medical history, surgical history, medications, allergies, family history and social history reviewed with patient today and changes made to appropriate areas of the chart.   Review of Systems  Constitutional: Negative.   HENT: Negative.    Eyes:  Positive for blurred vision. Negative for double vision, photophobia, pain, discharge and redness.  Respiratory: Negative.    Cardiovascular:  Positive for chest pain (a couple of days ago for a couple minutes x1). Negative for palpitations, orthopnea, claudication, leg swelling and PND.  Gastrointestinal: Negative.   Genitourinary:  Positive for frequency. Negative for dysuria, flank pain, hematuria and urgency.  Musculoskeletal:  Positive for myalgias (sciatica on R leg). Negative for back pain, falls, joint pain and neck pain.  Skin: Negative.   Neurological:  Positive for tingling. Negative for dizziness, tremors, sensory change, speech change, focal weakness, seizures, loss of consciousness, weakness and headaches.  Endo/Heme/Allergies: Negative.   Psychiatric/Behavioral: Negative.     All other ROS negative except what is listed above and in the HPI.      Objective:    BP 101/62   Pulse 66   Temp 98.7 F (37.1 C) (Oral)   Wt 177 lb 1.6 oz (80.3 kg)   SpO2 98%   BMI 24.70 kg/m   Wt Readings from Last 3 Encounters:  04/24/22 177 lb 1.6 oz (80.3 kg)  01/16/22 179 lb 14.4 oz (81.6 kg)  10/05/21 180 lb 14.4 oz (82.1  kg)    Physical Exam Vitals and nursing note reviewed.  Constitutional:      General: He is not in acute distress.    Appearance: Normal appearance. He is not ill-appearing, toxic-appearing or diaphoretic.  HENT:     Head: Normocephalic and atraumatic.      Right Ear: External ear normal.     Left Ear: External ear normal.     Nose: Nose normal.     Mouth/Throat:     Mouth: Mucous membranes are moist.     Pharynx: Oropharynx is clear.  Eyes:     General: No scleral icterus.       Right eye: No discharge.        Left eye: No discharge.     Extraocular Movements: Extraocular movements intact.     Conjunctiva/sclera: Conjunctivae normal.     Pupils: Pupils are equal, round, and reactive to light.  Cardiovascular:     Rate and Rhythm: Normal rate and regular rhythm.     Pulses: Normal pulses.     Heart sounds: Normal heart sounds. No murmur heard.    No friction rub. No gallop.  Pulmonary:     Effort: Pulmonary effort is normal. No respiratory distress.     Breath sounds: Normal breath sounds. No stridor. No wheezing, rhonchi or rales.  Chest:     Chest wall: No tenderness.  Musculoskeletal:        General: Normal range of motion.     Cervical back: Normal range of motion and neck supple.  Skin:    General: Skin is warm and dry.     Capillary Refill: Capillary refill takes less than 2 seconds.     Coloration: Skin is not jaundiced or pale.     Findings: No bruising, erythema, lesion or rash.  Neurological:     General: No focal deficit present.     Mental Status: He is alert and oriented to person, place, and time. Mental status is at baseline.  Psychiatric:        Mood and Affect: Mood normal.        Behavior: Behavior normal.        Thought Content: Thought content normal.        Judgment: Judgment normal.        04/24/2022    1:20 PM 10/04/2020    1:05 PM 09/05/2019    9:02 AM 07/19/2018   11:16 AM 03/22/2017    3:07 PM  6CIT Screen  What Year? 0 points 0 points 0 points 0 points 0 points  What month? 0 points 0 points 0 points 0 points 0 points  What time? 0 points 0 points 0 points 0 points 0 points  Count back from 20 0 points 0 points 0 points 0 points 0 points  Months in reverse 0 points 2 points 2 points 0  points 0 points  Repeat phrase 0 points 0 points 2 points 2 points 0 points  Total Score 0 points 2 points 4 points 2 points 0 points    Results for orders placed or performed in visit on 01/16/22  Novel Coronavirus, NAA ([facility])   Specimen: Saline  Result Value Ref Range   SARS-CoV-2, NAA Detected (A) Not Detected  Veritor Flu A/B Waived  Result Value Ref Range   Influenza A Negative Negative   Influenza B Negative Negative      Assessment & Plan:   Problem List Items Addressed This Visit  Endocrine   Type 2 diabetes mellitus with diabetic neuropathy, unspecified    Doing great with A1c of 6.1. Continue diet and exercise. Call with any concerns.       Relevant Orders   Bayer DCA Hb A1c Waived   CBC with Differential/Platelet   Comprehensive metabolic panel   Microalbumin, Urine Waived   TSH     Genitourinary   BPH (benign prostatic hyperplasia)    Stable. Rechecking labs today. Await results. Treat as needed.       Relevant Orders   CBC with Differential/Platelet   Comprehensive metabolic panel   PSA     Other   Personal history of kidney stones    No pain. Rechecking labs today. Await results. Treat as needed.       Relevant Orders   CBC with Differential/Platelet   Comprehensive metabolic panel   Urinalysis, Routine w reflex microscopic   Hypercholesteremia    Under good control on current regimen. Continue current regimen. Continue to monitor. Call with any concerns. Refills given. Labs drawn today.      Relevant Medications   ezetimibe (ZETIA) 10 MG tablet   Other Relevant Orders   CBC with Differential/Platelet   Comprehensive metabolic panel   Lipid Panel w/o Chol/HDL Ratio   Vitamin D deficiency    Stable. Rechecking labs today. Await results. Treat as needed.       Relevant Orders   CBC with Differential/Platelet   Comprehensive metabolic panel   VITAMIN D 25 Hydroxy (Vit-D Deficiency, Fractures)   B12 deficiency    Stable.  Rechecking labs today. Await results. Treat as needed.       Relevant Orders   B12   CBC with Differential/Platelet   Comprehensive metabolic panel   Other Visit Diagnoses     Encounter for Medicare annual wellness exam    -  Primary   Preventative care discussed today as below.   Screening for colon cancer       Referral to GI placed today.   Relevant Orders   Ambulatory referral to Gastroenterology        Preventative Services:  Health Risk Assessment and Personalized Prevention Plan: Done today Bone Mass Measurements: N/A CVD Screening: Done today Colon Cancer Screening: Ordered today Depression Screening: Done today Diabetes Screening: Done today Glaucoma Screening: See your eye doctor Hepatitis B vaccine: N/A Hepatitis C screening: Up to date HIV Screening: up to date Flu Vaccine: up to date Lung cancer Screening: N/A Obesity Screening: Done today Pneumonia Vaccines (2): up to date STI Screening: N/A PSA screening: Done today   LABORATORY TESTING:  Health maintenance labs ordered today as discussed above.   IMMUNIZATIONS:   - Tdap: Tetanus vaccination status reviewed: last tetanus booster within 10 years. - Influenza: Up to date - Pneumovax: Up to date - Prevnar: Up to date - Zostavax vaccine: Up to date  SCREENING: - Colonoscopy: Ordered today  Discussed with patient purpose of the colonoscopy is to detect colon cancer at curable precancerous or early stages   PATIENT COUNSELING:    Sexuality: Discussed sexually transmitted diseases, partner selection, use of condoms, avoidance of unintended pregnancy  and contraceptive alternatives.   Advised to avoid cigarette smoking.  I discussed with the patient that most people either abstain from alcohol or drink within safe limits (<=14/week and <=4 drinks/occasion for males, <=7/weeks and <= 3 drinks/occasion for females) and that the risk for alcohol disorders and other health effects rises proportionally  with the number of  drinks per week and how often a drinker exceeds daily limits.  Discussed cessation/primary prevention of drug use and availability of treatment for abuse.   Diet: Encouraged to adjust caloric intake to maintain  or achieve ideal body weight, to reduce intake of dietary saturated fat and total fat, to limit sodium intake by avoiding high sodium foods and not adding table salt, and to maintain adequate dietary potassium and calcium preferably from fresh fruits, vegetables, and low-fat dairy products.    stressed the importance of regular exercise  Injury prevention: Discussed safety belts, safety helmets, smoke detector, smoking near bedding or upholstery.   Dental health: Discussed importance of regular tooth brushing, flossing, and dental visits.   Follow up plan: NEXT PREVENTATIVE PHYSICAL DUE IN 1 YEAR. Return in about 6 months (around 10/24/2022).

## 2022-04-24 NOTE — Telephone Encounter (Signed)
Patient most recent Diabetic Eye Exam was requested at today's visit. 

## 2022-04-24 NOTE — Assessment & Plan Note (Signed)
Stable. Rechecking labs today. Await results. Treat as needed.  

## 2022-04-24 NOTE — Patient Instructions (Addendum)
Preventative Services:  Health Risk Assessment and Personalized Prevention Plan: Done today Bone Mass Measurements: N/A CVD Screening: Done today Colon Cancer Screening:  Ordered today Depression Screening: Done today Diabetes Screening: Done today Glaucoma Screening: See your eye doctor Hepatitis B vaccine: N/A Hepatitis C screening: Up to date HIV Screening: up to date Flu Vaccine: up to date Lung cancer Screening: N/A Obesity Screening: Done today Pneumonia Vaccines (2): up to date STI Screening: N/A PSA screening: Done today

## 2022-04-25 LAB — COMPREHENSIVE METABOLIC PANEL
ALT: 19 IU/L (ref 0–44)
AST: 23 IU/L (ref 0–40)
Albumin/Globulin Ratio: 2.6 — ABNORMAL HIGH (ref 1.2–2.2)
Albumin: 4.5 g/dL (ref 3.8–4.8)
Alkaline Phosphatase: 67 IU/L (ref 44–121)
BUN/Creatinine Ratio: 15 (ref 10–24)
BUN: 13 mg/dL (ref 8–27)
Bilirubin Total: 0.7 mg/dL (ref 0.0–1.2)
CO2: 25 mmol/L (ref 20–29)
Calcium: 9.1 mg/dL (ref 8.6–10.2)
Chloride: 101 mmol/L (ref 96–106)
Creatinine, Ser: 0.89 mg/dL (ref 0.76–1.27)
Globulin, Total: 1.7 g/dL (ref 1.5–4.5)
Glucose: 143 mg/dL — ABNORMAL HIGH (ref 70–99)
Potassium: 4 mmol/L (ref 3.5–5.2)
Sodium: 139 mmol/L (ref 134–144)
Total Protein: 6.2 g/dL (ref 6.0–8.5)
eGFR: 89 mL/min/{1.73_m2} (ref >59)

## 2022-04-25 LAB — CBC WITH DIFFERENTIAL/PLATELET
Basophils Absolute: 0 10*3/uL (ref 0.0–0.2)
Basos: 0 %
EOS (ABSOLUTE): 0.1 10*3/uL (ref 0.0–0.4)
Eos: 2 %
Hematocrit: 39.8 % (ref 37.5–51.0)
Hemoglobin: 13.5 g/dL (ref 13.0–17.7)
Immature Grans (Abs): 0 10*3/uL (ref 0.0–0.1)
Immature Granulocytes: 0 %
Lymphocytes Absolute: 1 10*3/uL (ref 0.7–3.1)
Lymphs: 22 %
MCH: 33.7 pg — ABNORMAL HIGH (ref 26.6–33.0)
MCHC: 33.9 g/dL (ref 31.5–35.7)
MCV: 99 fL — ABNORMAL HIGH (ref 79–97)
Monocytes Absolute: 0.3 10*3/uL (ref 0.1–0.9)
Monocytes: 6 %
Neutrophils Absolute: 3.1 10*3/uL (ref 1.4–7.0)
Neutrophils: 70 %
Platelets: 133 10*3/uL — ABNORMAL LOW (ref 150–450)
RBC: 4.01 x10E6/uL — ABNORMAL LOW (ref 4.14–5.80)
RDW: 13 % (ref 11.6–15.4)
WBC: 4.5 10*3/uL (ref 3.4–10.8)

## 2022-04-25 LAB — VITAMIN B12: Vitamin B-12: 542 pg/mL (ref 232–1245)

## 2022-04-25 LAB — VITAMIN D 25 HYDROXY (VIT D DEFICIENCY, FRACTURES): Vit D, 25-Hydroxy: 26.5 ng/mL — ABNORMAL LOW (ref 30.0–100.0)

## 2022-04-25 LAB — TSH: TSH: 1.45 u[IU]/mL (ref 0.450–4.500)

## 2022-04-25 LAB — LIPID PANEL W/O CHOL/HDL RATIO
Cholesterol, Total: 137 mg/dL (ref 100–199)
HDL: 44 mg/dL (ref >39)
LDL Chol Calc (NIH): 73 mg/dL (ref 0–99)
Triglycerides: 110 mg/dL (ref 0–149)
VLDL Cholesterol Cal: 20 mg/dL (ref 5–40)

## 2022-04-25 LAB — PSA: Prostate Specific Ag, Serum: 0.6 ng/mL (ref 0.0–4.0)

## 2022-05-02 ENCOUNTER — Telehealth: Payer: Self-pay

## 2022-05-02 DIAGNOSIS — E1140 Type 2 diabetes mellitus with diabetic neuropathy, unspecified: Secondary | ICD-10-CM

## 2022-05-02 NOTE — Telephone Encounter (Signed)
Referral for eye exam placed.  

## 2022-05-02 NOTE — Telephone Encounter (Signed)
Per provider requested to have a telephone encounter sent to have referral placed for the patient. Patient was scheduled on 02/24/21 for and I&D [facility] and the patient has not future appointments schedule. Please advise?

## 2022-08-09 ENCOUNTER — Encounter: Payer: Self-pay | Admitting: Internal Medicine

## 2022-08-09 ENCOUNTER — Encounter: Payer: Self-pay | Admitting: Family Medicine

## 2022-08-10 ENCOUNTER — Telehealth: Payer: Self-pay | Admitting: Family Medicine

## 2022-08-10 NOTE — Telephone Encounter (Signed)
He was referred to GI for screening colonoscopy but I do not order a colonoscopy- that's GI. They will need to ask them for more details

## 2022-08-10 NOTE — Telephone Encounter (Signed)
Copied from CRM [id number]. Topic: General - Other >> Aug 09, 2022  2:08 PM Dondra Prader A wrote: Reason for CRM: LaShanna from [facility] is calling to see if the pt colonoscopy was sent over as diagnostic or preventative. The pt was given a quote of $1700 and that is a quote for diagnostic. Leilani Merl states that it should be sent over as preventative for his colonoscopy. Leilani Merl would like for the pt to get a call back from the office.

## 2022-08-11 NOTE — Telephone Encounter (Signed)
Attempted to reach no answer left message for a return call.

## 2022-08-11 NOTE — Telephone Encounter (Signed)
Patient has called back to check on the status of the colonoscopy that was sent over for him. Patient stated this could be sent over to the hospital in two ways, he stated one way patient he will have to pay $1,700 and the other way it can be sent over his insurance will cover it. Patient needs it to be sent the way insurance will cover it, and he believes it was sent over the other way instead. Please advise and contact patient back at # [phone].

## 2022-08-11 NOTE — Telephone Encounter (Signed)
Second attempt to reach pt. No answer vm left.

## 2022-08-16 ENCOUNTER — Encounter: Payer: Self-pay | Admitting: Internal Medicine

## 2022-08-16 ENCOUNTER — Ambulatory Visit
Admission: RE | Admit: 2022-08-16 | Discharge: 2022-08-16 | Disposition: A | Discharge status: Home / Self Care | Payer: BC Managed Care – PPO | Attending: Internal Medicine | Admitting: Internal Medicine

## 2022-08-16 ENCOUNTER — Ambulatory Visit: Payer: BC Managed Care – PPO | Admitting: Anesthesiology

## 2022-08-16 ENCOUNTER — Encounter
Admission: RE | Disposition: A | Discharge status: Home / Self Care | Payer: Self-pay | Source: Home / Self Care | Attending: Internal Medicine

## 2022-08-16 DIAGNOSIS — K640 First degree hemorrhoids: Secondary | ICD-10-CM | POA: Diagnosis not present

## 2022-08-16 DIAGNOSIS — Z1211 Encounter for screening for malignant neoplasm of colon: Secondary | ICD-10-CM | POA: Insufficient documentation

## 2022-08-16 DIAGNOSIS — Z85828 Personal history of other malignant neoplasm of skin: Secondary | ICD-10-CM | POA: Diagnosis not present

## 2022-08-16 DIAGNOSIS — Z86010 Personal history of colonic polyps: Secondary | ICD-10-CM | POA: Diagnosis not present

## 2022-08-16 HISTORY — PX: COLONOSCOPY WITH PROPOFOL: SHX5780

## 2022-08-16 SURGERY — COLONOSCOPY WITH PROPOFOL
Anesthesia: General

## 2022-08-16 MED ORDER — LIDOCAINE HCL (CARDIAC) PF 100 MG/5ML IV SOSY
PREFILLED_SYRINGE | INTRAVENOUS | Status: DC | PRN
  Administered 2022-08-16: 60 mg via INTRAVENOUS

## 2022-08-16 MED ORDER — LIDOCAINE HCL (PF) 2 % IJ SOLN
INTRAMUSCULAR | Status: AC
  Filled 2022-08-16: qty 5

## 2022-08-16 MED ORDER — PROPOFOL 500 MG/50ML IV EMUL
INTRAVENOUS | Status: DC | PRN
  Administered 2022-08-16: 50 ug/kg/min via INTRAVENOUS

## 2022-08-16 MED ORDER — SODIUM CHLORIDE 0.9 % IV SOLN: INTRAVENOUS | Status: DC

## 2022-08-16 MED ORDER — PROPOFOL 10 MG/ML IV BOLUS
INTRAVENOUS | Status: DC | PRN
Start: 2022-08-16 — End: 2022-08-16
  Administered 2022-08-16: 40 mg via INTRAVENOUS

## 2022-08-16 NOTE — Op Note (Signed)
[facility] Patient Name: Darius Williams Procedure Date: 08/16/2022 10:43 AM MRN: [id number] Account #: [id number] Date of Birth: [birth year] Admit Type: Outpatient Age: 77 Room: [facility] ENDO ROOM 2 Gender: Male Note Status: Finalized Instrument Name: Prentice Docker [id number] Procedure:             Colonoscopy Indications:           High risk colon cancer surveillance: Personal history                         of non-advanced adenoma Providers:             Boykin Nearing. Norma Fredrickson MD, MD Referring MD:          Dorcas Carrow (Referring MD) Medicines:             Propofol per Anesthesia Complications:         No immediate complications. Procedure:             Pre-Anesthesia Assessment:                        - The risks and benefits of the procedure and the                         sedation options and risks were discussed with the                         patient. All questions were answered and informed                         consent was obtained.                        - Patient identification and proposed procedure were                         verified prior to the procedure by the nurse. The                         procedure was verified in the procedure room.                        - ASA Grade Assessment: III - A patient with severe                         systemic disease.                        - After reviewing the risks and benefits, the patient                         was deemed in satisfactory condition to undergo the                         procedure.                        After obtaining informed consent, the colonoscope was                         passed under direct  vision. Throughout the procedure,                         the patient's blood pressure, pulse, and oxygen                         saturations were monitored continuously. The                         Colonoscope was introduced through the anus and                         advanced to the  the cecum, identified by appendiceal                         orifice and ileocecal valve. The colonoscopy was                         performed without difficulty. The patient tolerated                         the procedure well. The quality of the bowel                         preparation was good. The ileocecal valve, appendiceal                         orifice, and rectum were photographed. Findings:      The perianal and digital rectal examinations were normal. Pertinent       negatives include normal sphincter tone and no palpable rectal lesions.      Non-bleeding internal hemorrhoids were found during retroflexion. The       hemorrhoids were Grade I (internal hemorrhoids that do not prolapse).      The colon (entire examined portion) appeared normal. Impression:            - Non-bleeding internal hemorrhoids.                        - The entire examined colon is normal.                        - No specimens collected. Recommendation:        - Patient has a contact number available for                         emergencies. The signs and symptoms of potential                         delayed complications were discussed with the patient.                         Return to normal activities tomorrow. Written                         discharge instructions were provided to the patient.                        - Resume previous diet.                        -  Continue present medications.                        - No repeat colonoscopy due to current age (36 years                         or older) and the absence of colonic polyps.                        - You do NOT require further colon cancer screening                         measures (Annual stool testing (i.e. hemoccult, FIT,                         cologuard), sigmoidoscopy, colonoscopy or CT                         colonography). You should share this recommendation                         with your Primary Care provider.                         - Return to GI office PRN.                        - The findings and recommendations were discussed with                         the patient. Procedure Code(s):     --- Professional ---                        J1914, Colorectal cancer screening; colonoscopy on                         individual at high risk Diagnosis Code(s):     --- Professional ---                        K64.0, First degree hemorrhoids                        Z86.010, Personal history of colonic polyps CPT copyright 2022 American Medical Association. All rights reserved. The codes documented in this report are preliminary and upon coder review may  be revised to meet current compliance requirements. Stanton Kidney MD, MD 08/16/2022 11:18:25 AM This report has been signed electronically. Number of Addenda: 0 Note Initiated On: 08/16/2022 10:43 AM Scope Withdrawal Time: 0 hours 6 minutes 41 seconds  Total Procedure Duration: 0 hours 12 minutes 32 seconds  Estimated Blood Loss:  Estimated blood loss: none.      [facility]

## 2022-08-16 NOTE — Transfer of Care (Signed)
Immediate Anesthesia Transfer of Care Note  Patient: Darius Williams  Procedure(s) Performed: COLONOSCOPY WITH PROPOFOL  Patient Location: PACU  Anesthesia Type:General  Level of Consciousness: sedated  Airway & Oxygen Therapy: Patient Spontanous Breathing  Post-op Assessment: Report given to RN and Post -op Vital signs reviewed and stable  Post vital signs: Reviewed and stable  Last Vitals:  Vitals Value Taken Time  BP 92/55 08/16/22 1119  Temp    Pulse 54 08/16/22 1119  Resp 19 08/16/22 1119  SpO2 100 % 08/16/22 1119  Vitals shown include unfiled device data.  Last Pain:  Vitals:   08/16/22 0956  TempSrc: Temporal  PainSc: 0-No pain         Complications: No notable events documented.

## 2022-08-16 NOTE — Interval H&P Note (Signed)
History and Physical Interval Note:  08/16/2022 10:47 AM  Darius Williams  has presented today for surgery, with the diagnosis of V12.72 (ICD-9-CM) - Z86.010 (ICD-10-CM) - Hx of adenomatous colonic polyps.  The various methods of treatment have been discussed with the patient and family. After consideration of risks, benefits and other options for treatment, the patient has consented to  Procedure(s) with comments: COLONOSCOPY WITH PROPOFOL (N/A) - ARRIVAL ABOUT 9:30 AM ( IF POSSIBLE) DUE TO TRANSPORTATION as a surgical intervention.  The patient's history has been reviewed, patient examined, no change in status, stable for surgery.  I have reviewed the patient's chart and labs.  Questions were answered to the patient's satisfaction.     [place], [place]

## 2022-08-16 NOTE — Telephone Encounter (Signed)
Third attempt to reach pt no answer left vm

## 2022-08-16 NOTE — Anesthesia Preprocedure Evaluation (Signed)
Anesthesia Evaluation  Patient identified by MRN, date of birth, ID band Patient awake    Reviewed: Allergy & Precautions, H&P , NPO status , Patient's Chart, lab work & pertinent test results, reviewed documented beta blocker date and time   Airway Mallampati: II   Neck ROM: full    Dental  (+) Poor Dentition   Pulmonary neg pulmonary ROS   Pulmonary exam normal        Cardiovascular Exercise Tolerance: Good negative cardio ROS Normal cardiovascular exam Rhythm:regular Rate:Normal     Neuro/Psych negative neurological ROS  negative psych ROS   GI/Hepatic negative GI ROS, Neg liver ROS,,,  Endo/Other  diabetes, Well Controlled    Renal/GU negative Renal ROS  negative genitourinary   Musculoskeletal   Abdominal   Peds  Hematology negative hematology ROS (+)   Anesthesia Other Findings Past Medical History: 05/01/2013: Actinic keratosis     Comment:  mid forehead - bx proven, with HPV related changes 03/18/2015: BPH (benign prostatic hyperplasia) No date: Cancer (HCC)     Comment:  SKIN CANCER FOREHEAD 03/18/2015: Fatigue No date: History of kidney stones     Comment:  H/O 03/18/2015: Hypercholesteremia 07/15/2014: Personal history of kidney stones 06/13/2017: Tinnitus Past Surgical History: No date: CHOLECYSTECTOMY 10/30/2017: CYSTOSCOPY WITH LITHOLAPAXY; N/A     Comment:  Procedure: CYSTOSCOPY WITH LITHOLAPAXY;  Surgeon: Orson Ape, MD;  Location: [facility] OR[facility];  Service: Urology;                Laterality: N/A; 10/30/2017: GREEN LIGHT LASER TURP (TRANSURETHRAL RESECTION OF  PROSTATE; N/A     Comment:  Procedure: GREEN LIGHT LASER TURP (TRANSURETHRAL               RESECTION OF PROSTATE;  Surgeon: Orson Ape, MD;                Location: [facility] ORS;  Service: Urology;  Laterality: N/A; 10/30/2017: HOLMIUM LASER APPLICATION; N/A     Comment:  Procedure: HOLMIUM LASER APPLICATION;  Surgeon:  Orson Ape, MD;  Location: [facility] OR[facility];  Service: Urology;                Laterality: N/A; 09/12/2007: LITHOTRIPSY 11/07/2007: LITHOTRIPSY BMI    Body Mass Index: 24.18 kg/m     Reproductive/Obstetrics negative OB ROS                             Anesthesia Physical Anesthesia Plan  ASA: 2  Anesthesia Plan: General   Post-op Pain Management:    Induction:   PONV Risk Score and Plan:   Airway Management Planned:   Additional Equipment:   Intra-op Plan:   Post-operative Plan:   Informed Consent: I have reviewed the patients History and Physical, chart, labs and discussed the procedure including the risks, benefits and alternatives for the proposed anesthesia with the patient or authorized representative who has indicated his/her understanding and acceptance.     Dental Advisory Given  Plan Discussed with: CRNA  Anesthesia Plan Comments:        Anesthesia Quick Evaluation

## 2022-08-16 NOTE — H&P (Signed)
Outpatient short stay form Pre-procedure 08/16/2022 10:44 AM Darius Williams, M.D.  Primary Physician: Olevia Perches, D.O.  Reason for visit:  Personal history of adenomatous colon polyps  History of present illness:                            Patient presents for colonoscopy for a personal hx of colon polyps. The patient denies abdominal pain, abnormal weight loss or rectal bleeding.      Current Facility-Administered Medications:    0.9 %  sodium chloride infusion, , Intravenous, Continuous, [place], Boykin Nearing, MD, Last Rate: 20 mL/hr at 08/16/22 1020, New Bag at 08/16/22 1020  Medications Prior to Admission  Medication Sig Dispense Refill Last Dose   Alpha-Lipoic Acid 100 MG CAPS Take by mouth.   Past Week   Cholecalciferol (VITAMIN D3) 25 MCG (1000 UT) CAPS Take 1 capsule by mouth daily.   Past Week   ezetimibe (ZETIA) 10 MG tablet Take 1 tablet (10 mg total) by mouth daily. 90 tablet 1 Past Week     Allergies  Allergen Reactions   Other     TRISCUITS-RASH   Penicillin G Benzathine Rash    Has patient had a PCN reaction causing immediate rash, facial/tongue/throat swelling, SOB or lightheadedness with hypotension: Yes Has patient had a PCN reaction causing severe rash involving mucus membranes or skin necrosis: No Has patient had a PCN reaction that required hospitalization: No Has patient had a PCN reaction occurring within the last 10 years: No If all of the above answers are "NO", then may proceed with Cephalosporin use.      Past Medical History:  Diagnosis Date   Actinic keratosis 05/01/2013   mid forehead - bx proven, with HPV related changes   BPH (benign prostatic hyperplasia) 03/18/2015   Cancer (HCC)    SKIN CANCER FOREHEAD   Fatigue 03/18/2015   History of kidney stones    H/O   Hypercholesteremia 03/18/2015   Personal history of kidney stones 07/15/2014   Tinnitus 06/13/2017    Review of systems:  Otherwise negative.    Physical Exam  Gen: Alert,  oriented. Appears stated age.  HEENT: [place]/AT. PERRLA. Lungs: CTA, no wheezes. CV: RR nl S1, S2. Abd: soft, benign, no masses. BS+ Ext: No edema. Pulses 2+    Planned procedures: Proceed with colonoscopy. The patient understands the nature of the planned procedure, indications, risks, alternatives and potential complications including but not limited to bleeding, infection, perforation, damage to internal organs and possible oversedation/side effects from anesthesia. The patient agrees and gives consent to proceed.  Please refer to procedure notes for findings, recommendations and patient disposition/instructions.     Coleton Woon K. Norma Williams, M.D. Gastroenterology 08/16/2022  10:44 AM

## 2022-08-17 ENCOUNTER — Encounter: Payer: Self-pay | Admitting: Internal Medicine

## 2022-08-17 NOTE — Anesthesia Postprocedure Evaluation (Signed)
Anesthesia Post Note  Patient: Darius Williams  Procedure(s) Performed: COLONOSCOPY WITH PROPOFOL  Patient location during evaluation: PACU Anesthesia Type: General Level of consciousness: awake and alert Pain management: pain level controlled Vital Signs Assessment: post-procedure vital signs reviewed and stable Respiratory status: spontaneous breathing, nonlabored ventilation, respiratory function stable and patient connected to nasal cannula oxygen Cardiovascular status: blood pressure returned to baseline and stable Postop Assessment: no apparent nausea or vomiting Anesthetic complications: no   No notable events documented.   Last Vitals:  Vitals:   08/16/22 1128 08/16/22 1138  BP: 117/67 112/63  Pulse: (!) 58 (!) 55  Resp: 13 14  Temp:    SpO2: 100%     Last Pain:  Vitals:   08/17/22 0739  TempSrc:   PainSc: 0-No pain                 Yevette Edwards

## 2022-10-24 ENCOUNTER — Encounter: Payer: Self-pay | Admitting: Family Medicine

## 2022-10-24 ENCOUNTER — Ambulatory Visit: Payer: BC Managed Care – PPO | Admitting: Family Medicine

## 2022-10-24 VITALS — BP 94/58 | HR 60 | Ht 71.6 in | Wt 182.6 lb

## 2022-10-24 DIAGNOSIS — H6120 Impacted cerumen, unspecified ear: Secondary | ICD-10-CM

## 2022-10-24 DIAGNOSIS — E7800 Pure hypercholesterolemia, unspecified: Secondary | ICD-10-CM

## 2022-10-24 DIAGNOSIS — L570 Actinic keratosis: Secondary | ICD-10-CM

## 2022-10-24 DIAGNOSIS — E538 Deficiency of other specified B group vitamins: Secondary | ICD-10-CM | POA: Diagnosis not present

## 2022-10-24 DIAGNOSIS — N400 Enlarged prostate without lower urinary tract symptoms: Secondary | ICD-10-CM

## 2022-10-24 DIAGNOSIS — E1140 Type 2 diabetes mellitus with diabetic neuropathy, unspecified: Secondary | ICD-10-CM | POA: Diagnosis not present

## 2022-10-24 DIAGNOSIS — Z23 Encounter for immunization: Secondary | ICD-10-CM | POA: Diagnosis not present

## 2022-10-24 DIAGNOSIS — E559 Vitamin D deficiency, unspecified: Secondary | ICD-10-CM

## 2022-10-24 MED ORDER — EZETIMIBE 10 MG PO TABS: 10.0000 mg | ORAL_TABLET | Freq: Every day | ORAL | 1 refills | Status: DC

## 2022-10-24 NOTE — Assessment & Plan Note (Signed)
Rechecking labs today. Await results. Treat as needed.  °

## 2022-10-24 NOTE — Assessment & Plan Note (Signed)
Under good control not on medicine. Continue to monitor. Call with any concerns. 

## 2022-10-24 NOTE — Progress Notes (Signed)
BP (!) 94/58   Pulse 60   Ht 5' 11.6" (1.819 m)   Wt 182 lb 9.6 oz (82.8 kg)   SpO2 97%   BMI 25.04 kg/m    Subjective:    Patient ID: Darius Williams, male    DOB: [birth year], 77 y.o.   MRN: [id number]  HPI: Darius Williams is a 77 y.o. male  Chief Complaint  Patient presents with   Diabetes   Skin Problem    Patient says he noticed a spot on his forehead and he first noticed it about two weeks ago.    DIABETES Hypoglycemic episodes:no Polydipsia/polyuria: no Visual disturbance: yes Chest pain: no Paresthesias: yes Glucose Monitoring: no  Accucheck frequency: Not Checking Taking Insulin?: no Blood Pressure Monitoring: not checking Retinal Examination: Not up to Date Foot Exam: Up to Date Diabetic Education: Completed Pneumovax: Up to Date Influenza: Up to Date Aspirin: no  HYPERLIPIDEMIA Hyperlipidemia status: excellent compliance Satisfied with current treatment?  yes Side effects:  no Medication compliance: excellent compliance Past cholesterol meds: zetia Supplements: none Aspirin:  no The 10-year ASCVD risk score (Arnett DK, et al., 2019) is: 28.9%   Values used to calculate the score:     Age: 56 years     Sex: Male     Is Non-Hispanic African American: No     Diabetic: Yes     Tobacco smoker: No     Systolic Blood Pressure: 94 mmHg     Is BP treated: No     HDL Cholesterol: 44 mg/dL     Total Cholesterol: 137 mg/dL Chest pain:  no Coronary artery disease:  no  BPH BPH status: stable Satisfied with current treatment?: yes Medication side effects: N/A Duration: chronic Nocturia: 1/night Urinary frequency:no Incomplete voiding: no Urgency: no Weak urinary stream: no Straining to start stream: no Dysuria: no Onset: gradual Severity: mild  Relevant past medical, surgical, family and social history reviewed and updated as indicated. Interim medical history since our last visit reviewed. Allergies and medications reviewed and  updated.  Review of Systems  Constitutional: Negative.   Respiratory: Negative.    Cardiovascular: Negative.   Musculoskeletal: Negative.   Neurological: Negative.   Psychiatric/Behavioral: Negative.      Per HPI unless specifically indicated above     Objective:    BP (!) 94/58   Pulse 60   Ht 5' 11.6" (1.819 m)   Wt 182 lb 9.6 oz (82.8 kg)   SpO2 97%   BMI 25.04 kg/m   Wt Readings from Last 3 Encounters:  10/24/22 182 lb 9.6 oz (82.8 kg)  08/16/22 173 lb 6.4 oz (78.7 kg)  04/24/22 177 lb 1.6 oz (80.3 kg)    Physical Exam Vitals and nursing note reviewed.  Constitutional:      General: He is not in acute distress.    Appearance: Normal appearance. He is not ill-appearing, toxic-appearing or diaphoretic.  HENT:     Head: Normocephalic and atraumatic.     Right Ear: Tympanic membrane and external ear normal.     Left Ear: Tympanic membrane and external ear normal.     Ears:     Comments: Cerumen in bilateral EAC    Nose: Nose normal.     Mouth/Throat:     Mouth: Mucous membranes are moist.     Pharynx: Oropharynx is clear.  Eyes:     General: No scleral icterus.       Right eye: No discharge.  Left eye: No discharge.     Extraocular Movements: Extraocular movements intact.     Conjunctiva/sclera: Conjunctivae normal.     Pupils: Pupils are equal, round, and reactive to light.  Cardiovascular:     Rate and Rhythm: Normal rate and regular rhythm.     Pulses: Normal pulses.     Heart sounds: Normal heart sounds. No murmur heard.    No friction rub. No gallop.  Pulmonary:     Effort: Pulmonary effort is normal. No respiratory distress.     Breath sounds: Normal breath sounds. No stridor. No wheezing, rhonchi or rales.  Chest:     Chest wall: No tenderness.  Musculoskeletal:        General: Normal range of motion.     Cervical back: Normal range of motion and neck supple.  Skin:    General: Skin is warm and dry.     Capillary Refill: Capillary refill  takes less than 2 seconds.     Coloration: Skin is not jaundiced or pale.     Findings: No bruising, erythema, lesion or rash.  Neurological:     General: No focal deficit present.     Mental Status: He is alert and oriented to person, place, and time. Mental status is at baseline.  Psychiatric:        Mood and Affect: Mood normal.        Behavior: Behavior normal.        Thought Content: Thought content normal.        Judgment: Judgment normal.     Results for orders placed or performed in visit on 04/24/22  B12  Result Value Ref Range   Vitamin B-12 542 232 - 1,245 pg/mL  Bayer DCA Hb A1c Waived  Result Value Ref Range   HB A1C (BAYER DCA - WAIVED) 6.1 (H) 4.8 - 5.6 %  CBC with Differential/Platelet  Result Value Ref Range   WBC 4.5 3.4 - 10.8 x10E3/uL   RBC 4.01 (L) 4.14 - 5.80 x10E6/uL   Hemoglobin 13.5 13.0 - 17.7 g/dL   Hematocrit 40.9 81.1 - 51.0 %   MCV 99 (H) 79 - 97 fL   MCH 33.7 (H) 26.6 - 33.0 pg   MCHC 33.9 31.5 - 35.7 g/dL   RDW 91.4 78.2 - 95.6 %   Platelets 133 (L) 150 - 450 x10E3/uL   Neutrophils 70 Not Estab. %   Lymphs 22 Not Estab. %   Monocytes 6 Not Estab. %   Eos 2 Not Estab. %   Basos 0 Not Estab. %   Neutrophils Absolute 3.1 1.4 - 7.0 x10E3/uL   Lymphocytes Absolute 1.0 0.7 - 3.1 x10E3/uL   Monocytes Absolute 0.3 0.1 - 0.9 x10E3/uL   EOS (ABSOLUTE) 0.1 0.0 - 0.4 x10E3/uL   Basophils Absolute 0.0 0.0 - 0.2 x10E3/uL   Immature Granulocytes 0 Not Estab. %   Immature Grans (Abs) 0.0 0.0 - 0.1 x10E3/uL  Comprehensive metabolic panel  Result Value Ref Range   Glucose 143 (H) 70 - 99 mg/dL   BUN 13 8 - 27 mg/dL   Creatinine, Ser 2.13 0.76 - 1.27 mg/dL   eGFR 89 >08 MV/HQI/6.96   BUN/Creatinine Ratio 15 10 - 24   Sodium 139 134 - 144 mmol/L   Potassium 4.0 3.5 - 5.2 mmol/L   Chloride 101 96 - 106 mmol/L   CO2 25 20 - 29 mmol/L   Calcium 9.1 8.6 - 10.2 mg/dL   Total Protein 6.2 6.0 -  8.5 g/dL   Albumin 4.5 3.8 - 4.8 g/dL   Globulin, Total 1.7  1.5 - 4.5 g/dL   Albumin/Globulin Ratio 2.6 (H) 1.2 - 2.2   Bilirubin Total 0.7 0.0 - 1.2 mg/dL   Alkaline Phosphatase 67 44 - 121 IU/L   AST 23 0 - 40 IU/L   ALT 19 0 - 44 IU/L  Lipid Panel w/o Chol/HDL Ratio  Result Value Ref Range   Cholesterol, Total 137 100 - 199 mg/dL   Triglycerides 161 0 - 149 mg/dL   HDL 44 >09 mg/dL   VLDL Cholesterol Cal 20 5 - 40 mg/dL   LDL Chol Calc (NIH) 73 0 - 99 mg/dL  Microalbumin, Urine Waived  Result Value Ref Range   Microalb, Ur Waived 30 (H) 0 - 19 mg/L   Creatinine, Urine Waived 50 10 - 300 mg/dL   Microalb/Creat Ratio 30-300 (H) <30 mg/g  PSA  Result Value Ref Range   Prostate Specific Ag, Serum 0.6 0.0 - 4.0 ng/mL  TSH  Result Value Ref Range   TSH 1.450 0.450 - 4.500 uIU/mL  Urinalysis, Routine w reflex microscopic  Result Value Ref Range   Specific Gravity, UA 1.020 1.005 - 1.030   pH, UA 5.5 5.0 - 7.5   Color, UA Yellow Yellow   Appearance Ur Clear Clear   Leukocytes,UA Negative Negative   Protein,UA Negative Negative/Trace   Glucose, UA Negative Negative   Ketones, UA Negative Negative   RBC, UA Negative Negative   Bilirubin, UA Negative Negative   Urobilinogen, Ur 0.2 0.2 - 1.0 mg/dL   Nitrite, UA Negative Negative   Microscopic Examination Comment   VITAMIN D 25 Hydroxy (Vit-D Deficiency, Fractures)  Result Value Ref Range   Vit D, 25-Hydroxy 26.5 (L) 30.0 - 100.0 ng/mL      Assessment & Plan:   Problem List Items Addressed This Visit       Endocrine   Type 2 diabetes mellitus with diabetic neuropathy, unspecified (HCC) - Primary    Rechecking labs today. Await results. Treat as needed.       Relevant Orders   CBC with Differential/Platelet   Comprehensive metabolic panel   Lipid Panel w/o Chol/HDL Ratio   Hgb A1c w/o eAG     Genitourinary   BPH (benign prostatic hyperplasia)    Under good control not on medicine. Continue to monitor. Call with any concerns.       Relevant Orders   CBC with  Differential/Platelet   Comprehensive metabolic panel   PSA     Other   Hypercholesteremia    Rechecking labs today. Await results. Treat as needed.       Relevant Medications   ezetimibe (ZETIA) 10 MG tablet   Other Relevant Orders   CBC with Differential/Platelet   Comprehensive metabolic panel   Lipid Panel w/o Chol/HDL Ratio   Vitamin D deficiency    Rechecking labs today. Await results. Treat as needed.       Relevant Orders   CBC with Differential/Platelet   Comprehensive metabolic panel   VITAMIN D 25 Hydroxy (Vit-D Deficiency, Fractures)   B12 deficiency    Rechecking labs today. Await results. Treat as needed.       Relevant Orders   CBC with Differential/Platelet   Comprehensive metabolic panel   U04   Other Visit Diagnoses     Needs flu shot       Given today.   Relevant Orders   Flu Vaccine Trivalent High  Dose (Fluad) (Completed)   Actinic keratosis       Will reach out to dermatology.   Cerumen in auditory canal on examination       Ears flushed today with good results.        Follow up plan: Return in about 6 months (around 04/24/2023) for physcial.

## 2022-10-25 LAB — CBC WITH DIFFERENTIAL/PLATELET
Basophils Absolute: 0 10*3/uL (ref 0.0–0.2)
Basos: 1 %
EOS (ABSOLUTE): 0.1 10*3/uL (ref 0.0–0.4)
Eos: 3 %
Hematocrit: 39.7 % (ref 37.5–51.0)
Hemoglobin: 13.2 g/dL (ref 13.0–17.7)
Immature Grans (Abs): 0 10*3/uL (ref 0.0–0.1)
Immature Granulocytes: 0 %
Lymphocytes Absolute: 0.7 10*3/uL (ref 0.7–3.1)
Lymphs: 21 %
MCH: 33.2 pg — ABNORMAL HIGH (ref 26.6–33.0)
MCHC: 33.2 g/dL (ref 31.5–35.7)
MCV: 100 fL — ABNORMAL HIGH (ref 79–97)
Monocytes Absolute: 0.3 10*3/uL (ref 0.1–0.9)
Monocytes: 10 %
Neutrophils Absolute: 2.2 10*3/uL (ref 1.4–7.0)
Neutrophils: 65 %
Platelets: 111 10*3/uL — ABNORMAL LOW (ref 150–450)
RBC: 3.97 x10E6/uL — ABNORMAL LOW (ref 4.14–5.80)
RDW: 13 % (ref 11.6–15.4)
WBC: 3.4 10*3/uL (ref 3.4–10.8)

## 2022-10-25 LAB — COMPREHENSIVE METABOLIC PANEL
ALT: 17 [IU]/L (ref 0–44)
AST: 18 [IU]/L (ref 0–40)
Albumin: 4.4 g/dL (ref 3.8–4.8)
Alkaline Phosphatase: 67 [IU]/L (ref 44–121)
BUN/Creatinine Ratio: 16 (ref 10–24)
BUN: 14 mg/dL (ref 8–27)
Bilirubin Total: 0.5 mg/dL (ref 0.0–1.2)
CO2: 26 mmol/L (ref 20–29)
Calcium: 9.1 mg/dL (ref 8.6–10.2)
Chloride: 104 mmol/L (ref 96–106)
Creatinine, Ser: 0.86 mg/dL (ref 0.76–1.27)
Globulin, Total: 2 g/dL (ref 1.5–4.5)
Glucose: 122 mg/dL — ABNORMAL HIGH (ref 70–99)
Potassium: 3.7 mmol/L (ref 3.5–5.2)
Sodium: 141 mmol/L (ref 134–144)
Total Protein: 6.4 g/dL (ref 6.0–8.5)
eGFR: 89 mL/min/{1.73_m2} (ref >59)

## 2022-10-25 LAB — LIPID PANEL W/O CHOL/HDL RATIO
Cholesterol, Total: 143 mg/dL (ref 100–199)
HDL: 50 mg/dL (ref >39)
LDL Chol Calc (NIH): 75 mg/dL (ref 0–99)
Triglycerides: 94 mg/dL (ref 0–149)
VLDL Cholesterol Cal: 18 mg/dL (ref 5–40)

## 2022-10-25 LAB — VITAMIN B12: Vitamin B-12: 428 pg/mL (ref 232–1245)

## 2022-10-25 LAB — PSA: Prostate Specific Ag, Serum: 0.6 ng/mL (ref 0.0–4.0)

## 2022-10-25 LAB — VITAMIN D 25 HYDROXY (VIT D DEFICIENCY, FRACTURES): Vit D, 25-Hydroxy: 28 ng/mL — ABNORMAL LOW (ref 30.0–100.0)

## 2022-10-25 LAB — HGB A1C W/O EAG: Hgb A1c MFr Bld: 5.9 % — ABNORMAL HIGH (ref 4.8–5.6)

## 2022-10-27 ENCOUNTER — Other Ambulatory Visit: Payer: Self-pay | Admitting: Family Medicine

## 2022-10-27 MED ORDER — VITAMIN B-12 1000 MCG PO TABS: 1000.0000 ug | ORAL_TABLET | Freq: Every day | ORAL | 4 refills | Status: DC

## 2022-10-27 MED ORDER — VITAMIN D (ERGOCALCIFEROL) 1.25 MG (50000 UNIT) PO CAPS: 50000.0000 [IU] | ORAL_CAPSULE | ORAL | 1 refills | Status: DC

## 2022-10-28 ENCOUNTER — Encounter: Payer: Self-pay | Admitting: Family Medicine

## 2022-11-08 ENCOUNTER — Encounter: Payer: Self-pay | Admitting: Dermatology

## 2022-11-08 ENCOUNTER — Ambulatory Visit: Payer: BC Managed Care – PPO | Admitting: Dermatology

## 2022-11-08 DIAGNOSIS — L820 Inflamed seborrheic keratosis: Secondary | ICD-10-CM | POA: Diagnosis not present

## 2022-11-08 DIAGNOSIS — L570 Actinic keratosis: Secondary | ICD-10-CM

## 2022-11-08 DIAGNOSIS — W908XXA Exposure to other nonionizing radiation, initial encounter: Secondary | ICD-10-CM

## 2022-11-08 NOTE — Progress Notes (Signed)
Follow-Up Visit   Subjective  Darius Williams is a 77 y.o. male who presents for the following:  spot at left forehead he noticed 3 or 4 weeks ago. No symptoms.  Also reports a itchy spot right shoulder.   Isk at left forehead  Back isk Chest   The patient has spots, moles and lesions to be evaluated, some may be new or changing and the patient may have concern these could be cancer.   The following portions of the chart were reviewed this encounter and updated as appropriate: medications, allergies, medical history  Review of Systems:  No other skin or systemic complaints except as noted in HPI or Assessment and Plan.  Objective  Well appearing patient in no apparent distress; mood and affect are within normal limits.  A focused examination was performed of the following areas: Face, chest, back, arms, legs   Relevant exam findings are noted in the Assessment and Plan.  left forehead x 1, left upper back x 1, left jawline x 1, left lower back x 1, right shoulder x 1, right upper back x 1, right posterior leg x 1, left abdomen x 1 (9) Erythematous stuck-on, waxy papule or plaque  right zygoma x 1, left helix x 1 (2) Erythematous thin papules/macules with gritty scale.     Assessment & Plan     Inflamed seborrheic keratosis (9) left forehead x 1, left upper back x 1, left jawline x 1, left lower back x 1, right shoulder x 1, right upper back x 1, right posterior leg x 1, left abdomen x 1  Symptomatic, irritating, patient would like treated.  Destruction of lesion - left forehead x 1, left upper back x 1, left jawline x 1, left lower back x 1, right shoulder x 1, right upper back x 1, right posterior leg x 1, left abdomen x 1 (9) Complexity: simple   Destruction method: cryotherapy   Informed consent: discussed and consent obtained   Timeout:  patient name, date of birth, surgical site, and procedure verified Lesion destroyed using liquid nitrogen: Yes   Region frozen  until ice ball extended beyond lesion: Yes   Cryo cycles: 1 or 2. Outcome: patient tolerated procedure well with no complications   Post-procedure details: wound care instructions given    Actinic keratosis (2) right zygoma x 1, left helix x 1  Actinic keratoses are precancerous spots that appear secondary to cumulative UV radiation exposure/sun exposure over time. They are chronic with expected duration over 1 year. A portion of actinic keratoses will progress to squamous cell carcinoma of the skin. It is not possible to reliably predict which spots will progress to skin cancer and so treatment is recommended to prevent development of skin cancer.  Recommend daily broad spectrum sunscreen SPF 30+ to sun-exposed areas, reapply every 2 hours as needed.  Recommend staying in the shade or wearing long sleeves, sun glasses (UVA+UVB protection) and wide brim hats (4-inch brim around the entire circumference of the hat). Call for new or changing lesions.  Destruction of lesion - right zygoma x 1, left helix x 1 (2) Complexity: simple   Destruction method: cryotherapy   Informed consent: discussed and consent obtained   Timeout:  patient name, date of birth, surgical site, and procedure verified Lesion destroyed using liquid nitrogen: Yes   Region frozen until ice ball extended beyond lesion: Yes   Outcome: patient tolerated procedure well with no complications   Post-procedure details: wound care instructions  given   Additional details:  Prior to procedure, discussed risks of blister formation, small wound, skin dyspigmentation, or rare scar following cryotherapy. Recommend Vaseline ointment to treated areas while healing.        Return for keep follow up in march 2025.  I, Asher Muir, CMA, am acting as scribe for Elie Goody, MD.   Documentation: I have reviewed the above documentation for accuracy and completeness, and I agree with the above.  Elie Goody,  MD

## 2022-11-08 NOTE — Patient Instructions (Addendum)
Seborrheic Keratosis  What causes seborrheic keratoses? Seborrheic keratoses are harmless, common skin growths that first appear during adult life.  As time goes by, more growths appear.  Some people may develop a large number of them.  Seborrheic keratoses appear on both covered and uncovered body parts.  They are not caused by sunlight.  The tendency to develop seborrheic keratoses can be inherited.  They vary in color from skin-colored to gray, brown, or even black.  They can be either smooth or have a rough, warty surface.   Seborrheic keratoses are superficial and look as if they were stuck on the skin.  Under the microscope this type of keratosis looks like layers upon layers of skin.  That is why at times the top layer may seem to fall off, but the rest of the growth remains and re-grows.    Treatment Seborrheic keratoses do not need to be treated, but can easily be removed in the office.  Seborrheic keratoses often cause symptoms when they rub on clothing or jewelry.  Lesions can be in the way of shaving.  If they become inflamed, they can cause itching, soreness, or burning.  Removal of a seborrheic keratosis can be accomplished by freezing, burning, or surgery. If any spot bleeds, scabs, or grows rapidly, please return to have it checked, as these can be an indication of a skin cancer.  Cryotherapy Aftercare  Wash gently with soap and water everyday.   Apply Vaseline and Band-Aid daily until healed.   Actinic keratoses are precancerous spots that appear secondary to cumulative UV radiation exposure/sun exposure over time. They are chronic with expected duration over 1 year. A portion of actinic keratoses will progress to squamous cell carcinoma of the skin. It is not possible to reliably predict which spots will progress to skin cancer and so treatment is recommended to prevent development of skin cancer.  Recommend daily broad spectrum sunscreen SPF 30+ to sun-exposed areas, reapply every  2 hours as needed.  Recommend staying in the shade or wearing long sleeves, sun glasses (UVA+UVB protection) and wide brim hats (4-inch brim around the entire circumference of the hat). Call for new or changing lesions.      Due to recent changes in healthcare laws, you may see results of your pathology and/or laboratory studies on MyChart before the doctors have had a chance to review them. We understand that in some cases there may be results that are confusing or concerning to you. Please understand that not all results are received at the same time and often the doctors may need to interpret multiple results in order to provide you with the best plan of care or course of treatment. Therefore, we ask that you please give [place] 2 business days to thoroughly review all your results before contacting the office for clarification. Should we see a critical lab result, you will be contacted sooner.   If You Need Anything After Your Visit  If you have any questions or concerns for your doctor, please call our main line at [phone] and press option 4 to reach your doctor's medical assistant. If no one answers, please leave a voicemail as directed and we will return your call as soon as possible. Messages left after 4 pm will be answered the following business day.   You may also send [place] a message via MyChart. We typically respond to MyChart messages within 1-2 business days.  For prescription refills, please ask your pharmacy to contact our office. Our  fax number is [phone].  If you have an urgent issue when the clinic is closed that cannot wait until the next business day, you can page your doctor at the number below.    Please note that while we do our best to be available for urgent issues outside of office hours, we are not available 24/7.   If you have an urgent issue and are unable to reach [place], you may choose to seek medical care at your doctor's office, retail clinic, urgent care  center, or emergency room.  If you have a medical emergency, please immediately call 911 or go to the emergency department.  Pager Numbers  - Dr. Gwen Pounds: [phone]  - Dr. Roseanne Reno: [phone]  - Dr. Katrinka Blazing: [phone]   In the event of inclement weather, please call our main line at [phone] for an update on the status of any delays or closures.  Dermatology Medication Tips: Please keep the boxes that topical medications come in in order to help keep track of the instructions about where and how to use these. Pharmacies typically print the medication instructions only on the boxes and not directly on the medication tubes.   If your medication is too expensive, please contact our office at [phone] option 4 or send [place] a message through MyChart.   We are unable to tell what your co-pay for medications will be in advance as this is different depending on your insurance coverage. However, we may be able to find a substitute medication at lower cost or fill out paperwork to get insurance to cover a needed medication.   If a prior authorization is required to get your medication covered by your insurance company, please allow [place] 1-2 business days to complete this process.  Drug prices often vary depending on where the prescription is filled and some pharmacies may offer cheaper prices.  The website www.goodrx.com contains coupons for medications through different pharmacies. The prices here do not account for what the cost may be with help from insurance (it may be cheaper with your insurance), but the website can give you the price if you did not use any insurance.  - You can print the associated coupon and take it with your prescription to the pharmacy.  - You may also stop by our office during regular business hours and pick up a GoodRx coupon card.  - If you need your prescription sent electronically to a different pharmacy, notify our office through [facility] or by  phone at [phone] option 4.     Si Usted Necesita Algo Despus de Su Visita  Tambin puede enviarnos un mensaje a travs de Clinical cytogeneticist. Por lo general respondemos a los mensajes de MyChart en el transcurso de 1 a 2 das hbiles.  Para renovar recetas, por favor pida a su farmacia que se ponga en contacto con nuestra oficina. Annie Sable de fax es [place] [phone].  Si tiene un asunto urgente cuando la clnica est cerrada y que no puede esperar hasta el siguiente da hbil, puede llamar/localizar a su doctor(a) al nmero que aparece a continuacin.   Por favor, tenga en cuenta que aunque hacemos todo lo posible para estar disponibles para asuntos urgentes fuera del horario de [place], no estamos disponibles las 24 horas del da, los 7 [address]  [address] de la [place].   Si tiene un problema urgente y no puede comunicarse con nosotros, puede optar por buscar atencin mdica  en el consultorio de su doctor(a), en una clnica privada, en un  centro de atencin urgente o en una sala de emergencias.  Si tiene Engineer, drilling, por favor llame inmediatamente al 911 o vaya a la sala de emergencias.  Nmeros de bper  - Dr. Gwen Pounds: [phone]  - Dra. Roseanne Reno: [phone]  - Dr. Katrinka Blazing: [phone]   En caso de inclemencias del tiempo, por favor llame a Lacy Duverney principal al [phone] para una actualizacin sobre el [place] de cualquier retraso o cierre.  Consejos para la medicacin en dermatologa: Por favor, guarde las cajas en las que vienen los medicamentos de uso tpico para ayudarle a seguir las instrucciones sobre dnde y cmo usarlos. Las farmacias generalmente imprimen las instrucciones del medicamento slo en las cajas y no directamente en los tubos del [place].   Si su medicamento es muy caro, por favor, pngase en contacto con Rolm [place]ala llamando al [phone] y presione la opcin 4 o envenos un mensaje a travs de Clinical cytogeneticist.   No podemos decirle cul ser su copago  por los medicamentos por adelantado ya que esto es diferente dependiendo de la cobertura de su seguro. Sin embargo, es posible que podamos encontrar un medicamento sustituto a Audiological scientist un formulario para que el seguro cubra el medicamento que se considera necesario.   Si se requiere una autorizacin previa para que su compaa de seguros [place] su medicamento, por favor permtanos de 1 a 2 das hbiles para completar [address]  Los precios de los medicamentos varan con frecuencia dependiendo del Environmental consultant de dnde se surte la receta y alguna farmacias pueden ofrecer precios ms baratos.  El sitio web www.goodrx.com tiene cupones para medicamentos de Health and safety inspector. Los precios aqu no tienen en cuenta lo que podra costar con la ayuda del seguro (puede ser ms barato con su seguro), pero el sitio web puede darle el precio si no utiliz Tourist information centre manager.  - Puede imprimir el cupn correspondiente y llevarlo con su receta a la farmacia.  - Tambin puede pasar por nuestra oficina durante el horario de atencin regular y Education officer, museum una tarjeta de cupones de GoodRx.  - Si necesita que su receta se enve electrnicamente a una farmacia diferente, informe a nuestra oficina a travs de MyChart de [place] o por telfono llamando al [phone] y presione la opcin 4.

## 2023-03-06 ENCOUNTER — Encounter: Payer: Self-pay | Admitting: Dermatology

## 2023-03-06 ENCOUNTER — Ambulatory Visit: Payer: 59 | Admitting: Dermatology

## 2023-03-06 DIAGNOSIS — W908XXA Exposure to other nonionizing radiation, initial encounter: Secondary | ICD-10-CM | POA: Diagnosis not present

## 2023-03-06 DIAGNOSIS — L578 Other skin changes due to chronic exposure to nonionizing radiation: Secondary | ICD-10-CM

## 2023-03-06 DIAGNOSIS — D1801 Hemangioma of skin and subcutaneous tissue: Secondary | ICD-10-CM

## 2023-03-06 DIAGNOSIS — Z1283 Encounter for screening for malignant neoplasm of skin: Secondary | ICD-10-CM | POA: Diagnosis not present

## 2023-03-06 DIAGNOSIS — D229 Melanocytic nevi, unspecified: Secondary | ICD-10-CM

## 2023-03-06 DIAGNOSIS — L570 Actinic keratosis: Secondary | ICD-10-CM

## 2023-03-06 DIAGNOSIS — L821 Other seborrheic keratosis: Secondary | ICD-10-CM

## 2023-03-06 DIAGNOSIS — L820 Inflamed seborrheic keratosis: Secondary | ICD-10-CM | POA: Diagnosis not present

## 2023-03-06 DIAGNOSIS — Z872 Personal history of diseases of the skin and subcutaneous tissue: Secondary | ICD-10-CM

## 2023-03-06 DIAGNOSIS — L814 Other melanin hyperpigmentation: Secondary | ICD-10-CM

## 2023-03-06 NOTE — Patient Instructions (Addendum)

## 2023-03-06 NOTE — Progress Notes (Signed)
 Follow-Up Visit   Subjective  Darius Williams is a 78 y.o. male who presents for the following: Skin Cancer Screening and Full Body Skin Exam, hx of precancers.  The patient presents for Total-Body Skin Exam (TBSE) for skin cancer screening and mole check. The patient has spots, moles and lesions to be evaluated, some may be new or changing and the patient may have concern these could be cancer.  The following portions of the chart were reviewed this encounter and updated as appropriate: medications, allergies, medical history  Review of Systems:  No other skin or systemic complaints except as noted in HPI or Assessment and Plan.  Objective  Well appearing patient in no apparent distress; mood and affect are within normal limits.  A full examination was performed including scalp, head, eyes, ears, nose, lips, neck, chest, axillae, abdomen, back, buttocks, bilateral upper extremities, bilateral lower extremities, hands, feet, fingers, toes, fingernails, and toenails. All findings within normal limits unless otherwise noted below.   Relevant physical exam findings are noted in the Assessment and Plan.  forhead x 7 (7) Erythematous thin papules/macules with gritty scale.  right cheek, hands, back, right knee (21) Stuck-on, waxy, tan-brown papules and plaques -- Discussed benign etiology and prognosis.   Assessment & Plan   SKIN CANCER SCREENING PERFORMED TODAY.  LENTIGINES, SEBORRHEIC KERATOSES, HEMANGIOMAS - Benign normal skin lesions - Benign-appearing - Call for any changes  MELANOCYTIC NEVI - Tan-brown and/or pink-flesh-colored symmetric macules and papules - Benign appearing on exam today - Observation - Call clinic for new or changing moles - Recommend daily use of broad spectrum spf 30+ sunscreen to sun-exposed areas.   AK (ACTINIC KERATOSIS) (7) forhead x 7 (7) ACTINIC DAMAGE - chronic, secondary to cumulative UV radiation exposure/sun exposure over time - diffuse  scaly erythematous macules with underlying dyspigmentation - Recommend daily broad spectrum sunscreen SPF 30+ to sun-exposed areas, reapply every 2 hours as needed.  - Recommend staying in the shade or wearing long sleeves, sun glasses (UVA+UVB protection) and wide brim hats (4-inch brim around the entire circumference of the hat). - Call for new or changing lesions.  Destruction of lesion - forhead x 7 (7) Complexity: simple   Destruction method: cryotherapy   Informed consent: discussed and consent obtained   Timeout:  patient name, date of birth, surgical site, and procedure verified Lesion destroyed using liquid nitrogen: Yes   Region frozen until ice ball extended beyond lesion: Yes   Outcome: patient tolerated procedure well with no complications   Post-procedure details: wound care instructions given   INFLAMED SEBORRHEIC KERATOSIS (21) right cheek, hands, back, right knee (21) Symptomatic, irritating, patient would like treated.  Destruction of lesion - right cheek, hands, back, right knee (21) Complexity: simple   Destruction method: cryotherapy   Informed consent: discussed and consent obtained   Timeout:  patient name, date of birth, surgical site, and procedure verified Lesion destroyed using liquid nitrogen: Yes   Region frozen until ice ball extended beyond lesion: Yes   Outcome: patient tolerated procedure well with no complications   Post-procedure details: wound care instructions given   Additional details:  Prior to procedure, discussed risks of blister formation, small wound, skin dyspigmentation, or rare scar following cryotherapy. Recommend Vaseline ointment to treated areas while healing.  ACTINIC SKIN DAMAGE   SKIN CANCER SCREENING   LENTIGO   MELANOCYTIC NEVUS, UNSPECIFIED LOCATION   SEBORRHEIC KERATOSIS     Return in about 1 year (around 03/05/2024) for TBSE,  hx of Aks .  IAngelique Holm, CMA, am acting as scribe for Armida Sans, MD .    Documentation: I have reviewed the above documentation for accuracy and completeness, and I agree with the above.  Armida Sans, MD

## 2023-03-08 ENCOUNTER — Ambulatory Visit: Payer: 59 | Admitting: Dermatology

## 2023-04-26 ENCOUNTER — Encounter: Payer: Self-pay | Admitting: Family Medicine

## 2023-05-21 ENCOUNTER — Ambulatory Visit: Payer: Self-pay | Admitting: *Deleted

## 2023-05-21 NOTE — Telephone Encounter (Signed)
 Message from [place] C sent at 05/21/2023 10:35 AM EDT  Summary: eye issue   Copied From CRM [id number]. 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 He said he is not on blood thinners so he thinks he bumps his eye in the night or sleeps on that side weird and he is unsure if he should be worried about it. His physical is scheduled for 6/24 and at first he just wanted me to put a note in for the doctor about it to maybe get an optometry referral. However, he kept going back and forth when I offered to have a nurse call him as he is unsure if it is a problem because he gets these now and again. He started to get a little more anxious towards the end of the call, so in order to make sure everything OK we decided he would go ahead and speak with the nurse now.          Call History  Contact Date/Time Type Contact Phone/Fax By  05/21/2023 10:17 AM EDT Phone (Incoming) Darius Williams, Darius Williams [phone] Darius Williams   Reason for Disposition  [1] Mild eye pain caused by sunscreen, smoke, smog, chlorine, food, soap or other mild irritant AND [2] no blurred vision    Busted blood vessel I get in my right eye about every 6 months.   It goes away on its own.  Answer Assessment - Initial Assessment Questions 1. ONSET: "When did the pain start?" (e.g., minutes, hours, days)     I have a busted blood vessel in my right eye.   This happens about every 6 months.  It goes away by itself.   It hurts a little but not bad.   It happened a couple of days in my right eye.  It'Williams always this eye. I just wanted to be sure it was ok.   I have my physical in June but this always goes away on it'Williams own.  I appreciate you calling me back but I'm fine since this happens every so often. 2. TIMING: "Does the pain come and go, or has it been constant since it started?" (e.g., constant, intermittent, fleeting)     It'Williams just a  mild irritation/pain.     3. SEVERITY: "How bad is the pain?"   (Scale 1-10; mild, moderate or severe)   - MILD (1-3): doesn't interfere with normal activities    - MODERATE (4-7): interferes with normal activities or awakens from sleep    - SEVERE (8-10): excruciating pain and patient unable to do normal activities     Very mild   Same as it always feels when this happens with a busted blood vessel in this eye. 4. LOCATION: "Where does it hurt?"  (e.g., eyelid, eye, cheekbone)     My right eye. 5. CAUSE: "What do you think is causing the pain?"     A busted blood vessel. 6. VISION: "Do you have blurred vision or changes in your vision?"      No 7. EYE DISCHARGE: "Is there any discharge (pus) from the eye(Williams)?"  If Yes, ask: "What color is it?"      No 8. FEVER: "Do you have a fever?" If Yes, ask: "What is it, how was it measured, and when did it start?"      Not asked 9. OTHER SYMPTOMS: "Do you have any other symptoms?" (  e.g., headache, nasal discharge, facial rash)     No 10. PREGNANCY: "Is there any chance you are pregnant?" "When was your last menstrual period?"       N/A  Protocols used: Eye Pain and Other Symptoms-A-AH

## 2023-05-21 NOTE — Telephone Encounter (Signed)
  Chief Complaint: Has a busted blood vessel in his right eye that happens about every 6 months in this same eye.   "It goes away on its own".   Symptoms: Mild irritation/pain. Frequency: It happened a couple of days ago. Pertinent Negatives: Patient denies pus, drainage, vision change, or swelling of eyelids. Disposition: [] ED /[] Urgent Care (no appt availability in office) / [] Appointment(In office/virtual)/ []  [facility]/ [x] Home Care/ [] Refused Recommended Disposition /[] [facility]/ []  Follow-up with PCP Additional Notes: Home care advice went over.   Instructed to call if symptoms develop or his eye is not getting better over the next week.

## 2023-05-21 NOTE — Telephone Encounter (Signed)
 Agree with below. Addendum:    I believe that she has limits with moving and including, toileting, bathing, feeding, dressing and grooming. I believe the power wheelchair is needed for pt to be able to perform ADL's in her home

## 2023-06-15 LAB — HM DIABETES EYE EXAM

## 2023-06-22 ENCOUNTER — Encounter: Payer: Self-pay | Admitting: Family Medicine

## 2023-06-22 ENCOUNTER — Ambulatory Visit: Admitting: Family Medicine

## 2023-06-22 VITALS — BP 100/64 | HR 67 | Ht 71.5 in | Wt 177.0 lb

## 2023-06-22 DIAGNOSIS — E559 Vitamin D deficiency, unspecified: Secondary | ICD-10-CM | POA: Diagnosis not present

## 2023-06-22 DIAGNOSIS — E1140 Type 2 diabetes mellitus with diabetic neuropathy, unspecified: Secondary | ICD-10-CM | POA: Diagnosis not present

## 2023-06-22 DIAGNOSIS — H6120 Impacted cerumen, unspecified ear: Secondary | ICD-10-CM

## 2023-06-22 DIAGNOSIS — E538 Deficiency of other specified B group vitamins: Secondary | ICD-10-CM

## 2023-06-22 DIAGNOSIS — E7800 Pure hypercholesterolemia, unspecified: Secondary | ICD-10-CM

## 2023-06-22 DIAGNOSIS — Z0000 Encounter for general adult medical examination without abnormal findings: Secondary | ICD-10-CM

## 2023-06-22 DIAGNOSIS — N400 Enlarged prostate without lower urinary tract symptoms: Secondary | ICD-10-CM

## 2023-06-22 LAB — MICROALBUMIN, URINE WAIVED
Creatinine, Urine Waived: 300 mg/dL (ref 10–300)
Microalb, Ur Waived: 150 mg/L — ABNORMAL HIGH (ref 0–19)

## 2023-06-22 LAB — BAYER DCA HB A1C WAIVED: HB A1C (BAYER DCA - WAIVED): 5.9 % — ABNORMAL HIGH (ref 4.8–5.6)

## 2023-06-22 MED ORDER — VITAMIN D (ERGOCALCIFEROL) 1.25 MG (50000 UNIT) PO CAPS: 50000.0000 [IU] | ORAL_CAPSULE | ORAL | 1 refills | Status: AC

## 2023-06-22 MED ORDER — EZETIMIBE 10 MG PO TABS: 10.0000 mg | ORAL_TABLET | Freq: Every day | ORAL | 1 refills | Status: DC

## 2023-06-22 MED ORDER — VITAMIN B-12 1000 MCG PO TABS: 1000.0000 ug | ORAL_TABLET | Freq: Every day | ORAL | 4 refills | Status: AC

## 2023-06-22 NOTE — Assessment & Plan Note (Signed)
 Rechecking labs today. Await results. Treat as needed.

## 2023-06-22 NOTE — Progress Notes (Unsigned)
 Subjective:   Darius Williams is a 78 y.o. male who presents for Medicare Annual/Subsequent preventive examination.  Visit Complete: In person  Patient Medicare AWV questionnaire was completed by the patient on 06/22/23; I have confirmed that all information answered by patient is correct and no changes since this date.  Cardiac Risk Factors include: advanced age (>80men, >11 women);dyslipidemia     Objective:    Today's Vitals   06/22/23 1314 06/22/23 1317  BP: 100/64 100/64  Pulse: 67 67  SpO2: 96% 96%  Weight: 177 lb 6.4 oz (80.5 kg) 177 lb (80.3 kg)  Height: 5' 11.6 (1.819 m) 5' 11.5 (1.816 m)  PainSc: 0-No pain 0-No pain   Body mass index is 24.34 kg/m.     06/22/2023    2:06 PM 08/16/2022    9:54 AM 10/30/2017   12:07 PM 10/26/2017    1:42 PM 03/22/2017    3:14 PM  Advanced Directives  Does Patient Have a Medical Advance Directive? No No No  No  No   Would patient like information on creating a medical advance directive? No - Patient declined  No - Patient declined   No - Patient declined      Data saved with a previous flowsheet row definition    Current Medications (verified) Outpatient Encounter Medications as of 06/22/2023  Medication Sig   Alpha-Lipoic Acid 100 MG CAPS Take by mouth.   Cholecalciferol (VITAMIN D3) 25 MCG (1000 UT) CAPS Take 1 capsule by mouth daily.   [DISCONTINUED] cyanocobalamin (VITAMIN B12) 1000 MCG tablet Take 1 tablet (1,000 mcg total) by mouth daily.   [DISCONTINUED] ezetimibe  (ZETIA ) 10 MG tablet Take 1 tablet (10 mg total) by mouth daily.   [DISCONTINUED] Vitamin D , Ergocalciferol , (DRISDOL ) 1.25 MG (50000 UNIT) CAPS capsule Take 1 capsule (50,000 Units total) by mouth every 7 (seven) days.   cyanocobalamin (VITAMIN B12) 1000 MCG tablet Take 1 tablet (1,000 mcg total) by mouth daily.   ezetimibe  (ZETIA ) 10 MG tablet Take 1 tablet (10 mg total) by mouth daily.   Vitamin D , Ergocalciferol , (DRISDOL ) 1.25 MG (50000 UNIT) CAPS capsule  Take 1 capsule (50,000 Units total) by mouth every 7 (seven) days.   No facility-administered encounter medications on file as of 06/22/2023.    Allergies (verified) Other and Penicillin g benzathine   History: Past Medical History:  Diagnosis Date   Actinic keratosis 05/01/2013   mid forehead - bx proven, with HPV related changes   BPH (benign prostatic hyperplasia) 03/18/2015   Cancer (HCC)    SKIN CANCER FOREHEAD   Fatigue 03/18/2015   History of kidney stones    H/O   Hypercholesteremia 03/18/2015   Personal history of kidney stones 07/15/2014   Tinnitus 06/13/2017   Past Surgical History:  Procedure Laterality Date   CHOLECYSTECTOMY     COLONOSCOPY WITH PROPOFOL  N/A 08/16/2022   Procedure: COLONOSCOPY WITH PROPOFOL ;  Surgeon: Toledo, Alphonsus Jeans, MD;  Location: [facility];  Service: Gastroenterology;  Laterality: N/A;  ARRIVAL ABOUT 9:30 AM ( IF POSSIBLE) DUE TO TRANSPORTATION   CYSTOSCOPY WITH LITHOLAPAXY N/A 10/30/2017   Procedure: CYSTOSCOPY WITH LITHOLAPAXY;  Surgeon: Rea Cambridge, MD;  Location: [facility];  Service: Urology;  Laterality: N/A;   GREEN LIGHT LASER TURP (TRANSURETHRAL RESECTION OF PROSTATE N/A 10/30/2017   Procedure: GREEN LIGHT LASER TURP (TRANSURETHRAL RESECTION OF PROSTATE;  Surgeon: Rea Cambridge, MD;  Location: [facility];  Service: Urology;  Laterality: N/A;   HOLMIUM LASER APPLICATION N/A 10/30/2017  Procedure: HOLMIUM LASER APPLICATION;  Surgeon: Rea Cambridge, MD;  Location: [facility];  Service: Urology;  Laterality: N/A;   LITHOTRIPSY  09/12/2007   LITHOTRIPSY  11/07/2007   Family History  Problem Relation Age of Onset   Cancer Mother        lung   Cancer Sister        breast   Cancer Father        prostate and skin   Varicose Veins Son    Cancer Paternal Grandmother        brain   Social History   Socioeconomic History   Marital status: Married    Spouse name: Not on file   Number of children: Not on file   Years of  education: Not on file   Highest education level: Not on file  Occupational History   Not on file  Tobacco Use   Smoking status: Never   Smokeless tobacco: Never  Vaping Use   Vaping status: Never Used  Substance and Sexual Activity   Alcohol use: No    Alcohol/week: 0.0 standard drinks of alcohol   Drug use: No   Sexual activity: Never  Other Topics Concern   Not on file  Social History Narrative   Not on file   Social Drivers of Health   Financial Resource Strain: Low Risk  (06/22/2023)   Overall Financial Resource Strain (CARDIA)    Difficulty of Paying Living Expenses: Not hard at all  Food Insecurity: No Food Insecurity (03/22/2017)   Hunger Vital Sign    Worried About Running Out of Food in the Last Year: Never true    Ran Out of Food in the Last Year: Never true  Transportation Needs: No Transportation Needs (06/22/2023)   PRAPARE - Administrator, Civil Service (Medical): No    Lack of Transportation (Non-Medical): No  Physical Activity: Inactive (06/22/2023)   Exercise Vital Sign    Days of Exercise per Week: 0 days    Minutes of Exercise per Session: 0 min  Stress: No Stress Concern Present (06/22/2023)   [employer] of Occupational Health - Occupational Stress Questionnaire    Feeling of Stress: Not at all  Social Connections: Socially Integrated (06/22/2023)   Social Connection and Isolation Panel    Frequency of Communication with Friends and Family: Three times a week    Frequency of Social Gatherings with Friends and Family: Once a week    Attends Religious Services: More than 4 times per year    Active Member of [employer] or Organizations: Yes    Attends Engineer, structural: More than 4 times per year    Marital Status: Married    Tobacco Counseling Counseling given: Not Answered   Clinical Intake:     Pain : No/denies pain Pain Score: 0-No pain     Diabetes: Yes CBG done?: No Did pt. bring in CBG monitor from home?:  No  How often do you need to have someone help you when you read instructions, pamphlets, or other written materials from your doctor or pharmacy?: 1 - Never  Interpreter Needed?: No  Information entered by :: M.Layne, CMA   Activities of Daily Living    06/22/2023    1:20 PM 06/22/2023    1:14 PM  In your present state of health, do you have any difficulty performing the following activities:  Hearing?  0  Vision?  0  Difficulty concentrating or making decisions?  0  Walking  or climbing stairs?  0  Dressing or bathing?  0  Doing errands, shopping?  0  Preparing Food and eating ? N   Using the Toilet? N   In the past six months, have you accidently leaked urine? N   Do you have problems with loss of bowel control? N   Managing your Medications? N   Managing your Finances? N   Housekeeping or managing your Housekeeping? N     Patient Care Team: Solomon Dupre, DO as PCP - General (Family Medicine) Antonette Batters, MD as Consulting Physician (Cardiology) Elta Halter, MD (Dermatology) Rea Cambridge, MD as Consulting Physician (Urology)  Indicate any recent Medical Services you may have received from other than [facility] providers in the past year (date may be approximate).     Assessment:   This is a routine wellness examination for Darius Williams.  Hearing/Vision screen No results found.   Goals Addressed   None    Depression Screen    06/22/2023    1:11 PM 10/24/2022    9:25 AM 04/24/2022    1:10 PM 01/16/2022    2:20 PM 10/05/2021    8:07 AM 05/06/2021    1:48 PM 02/22/2021    8:24 AM  PHQ 2/9 Scores  PHQ - 2 Score 1 1 1 1 1 1 1   PHQ- 9 Score 7 4 5 10 4 4 10     Fall Risk    06/22/2023    1:13 PM 10/24/2022    9:25 AM 04/24/2022    1:09 PM 01/16/2022    2:20 PM 10/05/2021    8:02 AM  Fall Risk   Falls in the past year? 0 0 0 0 1  Number falls in past yr: 0 0 0 0 1  Injury with Fall? 0 0 0 0 0  Risk for fall due to :   No Fall Risks No Fall Risks No Fall  Risks  Follow up  Falls evaluation completed Falls evaluation completed Falls evaluation completed  Falls evaluation completed      Data saved with a previous flowsheet row definition    MEDICARE RISK AT HOME: Medicare Risk at Home Any stairs in or around the home?: Yes If so, are there any without handrails?: No Adequate lighting in your home to reduce risk of falls?: Yes Life alert?: No Use of a cane, walker or w/c?: No Grab bars in the bathroom?: No Shower chair or bench in shower?: Yes Elevated toilet seat or a handicapped toilet?: No  TIMED UP AND GO:  Was the test performed?  Yes  Length of time to ambulate 10 feet: 12 sec Gait steady and fast without use of assistive device    Cognitive Function:    06/13/2017    4:10 PM  MMSE - Mini Mental State Exam  Orientation to time 5  Orientation to Place 5  Registration 3  Attention/ Calculation 5  Recall 3  Language- name 2 objects 2  Language- repeat 1  Language- follow 3 step command 3  Language- read & follow direction 1  Write a sentence 1  Copy design 1  Total score 30        06/22/2023    1:10 PM 04/24/2022    1:20 PM 10/04/2020    1:05 PM 09/05/2019    9:02 AM 07/19/2018   11:16 AM  6CIT Screen  What Year? 0 points 0 points 0 points 0 points 0 points  What month? 0 points 0  points 0 points 0 points 0 points  What time? 0 points 0 points 0 points 0 points 0 points  Count back from 20 0 points 0 points 0 points 0 points 0 points  Months in reverse 0 points 0 points 2 points 2 points 0 points  Repeat phrase 0 points 0 points 0 points 2 points 2 points  Total Score 0 points 0 points 2 points 4 points 2 points    Immunizations Immunization History  Administered Date(s) Administered   Fluad Quad(high Dose 65+) 09/05/2019, 10/04/2020, 10/05/2021   Fluad Trivalent(High Dose 65+) 10/24/2022   Influenza, High Dose Seasonal PF 11/12/2018   PFIZER(Purple Top)SARS-COV-2 Vaccination 01/27/2019, 02/17/2019    Pneumococcal Conjugate-13 01/15/2014   Pneumococcal Polysaccharide-23 09/12/2011   Td 11/11/2004, 07/19/2018   Zoster Recombinant(Shingrix ) 10/19/2021, 01/10/2022   Zoster, Live 09/12/2011    TDAP status: Up to date  Flu Vaccine status: Up to date  Pneumococcal vaccine status: Up to date  Covid-19 vaccine status: Declined, Education has been provided regarding the importance of this vaccine but patient still declined. Advised may receive this vaccine at local pharmacy or Health Dept.or vaccine clinic. Aware to provide a copy of the vaccination record if obtained from local pharmacy or Health Dept. Verbalized acceptance and understanding.  Qualifies for Shingles Vaccine? Yes   Zostavax completed Yes     Screening Tests Health Maintenance  Topic Date Due   Diabetic kidney evaluation - Urine ACR  04/24/2023   HEMOGLOBIN A1C  04/24/2023   INFLUENZA VACCINE  08/03/2023   Diabetic kidney evaluation - eGFR measurement  10/24/2023   OPHTHALMOLOGY EXAM  06/14/2024   FOOT EXAM  06/21/2024   Colonoscopy  08/16/2027   DTaP/Tdap/Td (3 - Tdap) 07/18/2028   Pneumococcal Vaccine: 50+ Years  Completed   Hepatitis C Screening  Completed   Zoster Vaccines- Shingrix   Completed   HPV VACCINES  Aged Out   Meningococcal B Vaccine  Aged Out   COVID-19 Vaccine  Discontinued    Health Maintenance  Health Maintenance Due  Topic Date Due   Diabetic kidney evaluation - Urine ACR  04/24/2023   HEMOGLOBIN A1C  04/24/2023    Colorectal cancer screening: Type of screening: Colonoscopy. Completed 08/16/22. Repeat every 5 years  Lung Cancer Screening: (Low Dose CT Chest recommended if Age 55-80 years, 20 pack-year currently smoking OR have quit w/in 15years.) does not qualify.    Additional Screening:  Hepatitis C Screening: does qualify; Completed 03/18/15  Vision Screening: Recommended annual ophthalmology exams for early detection of glaucoma and other disorders of the eye. Is the patient up to  date with their annual eye exam?  Yes  Who is the provider or what is the name of the office in which the patient attends annual eye exams? Dr. Alto Atta  Dental Screening: Recommended annual dental exams for proper oral hygiene  Diabetic Foot Exam: Diabetic Foot Exam: Completed today  Community Resource Referral / Chronic Care Management: CRR required this visit?  No   CCM required this visit?  No     Plan:     I have personally reviewed and noted the following in the patient's chart:   Medical and social history Use of alcohol, tobacco or illicit drugs  Current medications and supplements including opioid prescriptions. Patient is not currently taking opioid prescriptions. Functional ability and status Nutritional status Physical activity Advanced directives List of other physicians Hospitalizations, surgeries, and ER visits in previous 12 months Vitals Screenings to include cognitive, depression, and falls  Referrals and appointments  In addition, I have reviewed and discussed with patient certain preventive protocols, quality metrics, and best practice recommendations. A written personalized care plan for preventive services as well as general preventive health recommendations were provided to patient.     Terre Ferri, DO   06/22/2023   After Visit Summary: (In Person-Printed) AVS printed and given to the patient

## 2023-06-22 NOTE — Assessment & Plan Note (Signed)
 Under good control on current regimen. Continue current regimen. Continue to monitor. Call with any concerns. Refills given. Labs drawn today.

## 2023-06-22 NOTE — Progress Notes (Unsigned)
 BP 100/64 (BP Location: Left Arm, Patient Position: Sitting, Cuff Size: Normal)   Pulse 67   Ht 5' 11.5 (1.816 m)   Wt 177 lb (80.3 kg)   SpO2 96%   BMI 24.34 kg/m    Subjective:    Patient ID: Darius Williams, male    DOB: 18-Feb-1945, 78 y.o.   MRN: [id number]  HPI: Darius Williams is a 78 y.o. male presenting on 06/22/2023 for comprehensive medical examination. Current medical complaints include:  DIABETES Hypoglycemic episodes:no Polydipsia/polyuria: no Visual disturbance: yes Chest pain: no Paresthesias: no Glucose Monitoring: no  Accucheck frequency: Not Checking Taking Insulin?: no Blood Pressure Monitoring: not checking Retinal Examination: Up to Date Foot Exam: Up to Date Diabetic Education: Completed Pneumovax: Up to Date Influenza: Up to Date Aspirin: no  HYPERLIPIDEMIA Hyperlipidemia status: excellent compliance Satisfied with current treatment?  yes Side effects:  no Medication compliance: excellent compliance Past cholesterol meds: zetia  Supplements: none Aspirin:  no The 10-year ASCVD risk score (Arnett DK, et al., 2019) is: 30.9%   Values used to calculate the score:     Age: 8 years     Clincally relevant sex: Male     Is Non-Hispanic African American: No     Diabetic: Yes     Tobacco smoker: No     Systolic Blood Pressure: 100 mmHg     Is BP treated: No     HDL Cholesterol: 50 mg/dL     Total Cholesterol: 143 mg/dL Chest pain:  no Coronary artery disease:  no Family history CAD:  no Family history early CAD:  no  He currently lives with: alone Interim Problems from his last visit: no  Functional Status Survey: Is the patient deaf or have difficulty hearing?: No Does the patient have difficulty seeing, even when wearing glasses/contacts?: No Does the patient have difficulty concentrating, remembering, or making decisions?: No Does the patient have difficulty walking or climbing stairs?: No Does the patient have difficulty dressing or  bathing?: No Does the patient have difficulty doing errands alone such as visiting a doctor's office or shopping?: No  FALL RISK:    06/22/2023    1:13 PM 10/24/2022    9:25 AM 04/24/2022    1:09 PM 01/16/2022    2:20 PM 10/05/2021    8:02 AM  Fall Risk   Falls in the past year? 0 0 0 0 1  Number falls in past yr: 0 0 0 0 1  Injury with Fall? 0 0 0 0 0  Risk for fall due to :   No Fall Risks No Fall Risks No Fall Risks  Follow up  Falls evaluation completed Falls evaluation completed Falls evaluation completed  Falls evaluation completed      Data saved with a previous flowsheet row definition    Depression Screen    06/22/2023    1:11 PM 10/24/2022    9:25 AM 04/24/2022    1:10 PM 01/16/2022    2:20 PM 10/05/2021    8:07 AM  Depression screen PHQ 2/9  Decreased Interest 0 0 0 1 0  Down, Depressed, Hopeless 1 1 1  0 1  PHQ - 2 Score 1 1 1 1 1   Altered sleeping 3 0 2 3 1   Tired, decreased energy 2 1 2 2 2   Change in appetite 0 0 0 1 0  Feeling bad or failure about yourself  0 1 0 1 0  Trouble concentrating 1 0 0 1 0  Moving  slowly or fidgety/restless 0 1 0 1 0  Suicidal thoughts 0 0 0 0 0  PHQ-9 Score 7 4 5 10 4   Difficult doing work/chores Not difficult at all Not difficult at all Not difficult at all Not difficult at all Not difficult at all    Advanced Directives Does patient have a HCPOA?    no If yes, name and contact information:  Does patient have a living will or MOST form?  no  Past Medical History:  Past Medical History:  Diagnosis Date   Actinic keratosis 05/01/2013   mid forehead - bx proven, with HPV related changes   BPH (benign prostatic hyperplasia) 03/18/2015   Cancer (HCC)    SKIN CANCER FOREHEAD   Fatigue 03/18/2015   History of kidney stones    H/O   Hypercholesteremia 03/18/2015   Personal history of kidney stones 07/15/2014   Tinnitus 06/13/2017    Surgical History:  Past Surgical History:  Procedure Laterality Date   CHOLECYSTECTOMY      COLONOSCOPY WITH PROPOFOL  N/A 08/16/2022   Procedure: COLONOSCOPY WITH PROPOFOL ;  Surgeon: Toledo, Alphonsus Jeans, MD;  Location: [facility];  Service: Gastroenterology;  Laterality: N/A;  ARRIVAL ABOUT 9:30 AM ( IF POSSIBLE) DUE TO TRANSPORTATION   CYSTOSCOPY WITH LITHOLAPAXY N/A 10/30/2017   Procedure: CYSTOSCOPY WITH LITHOLAPAXY;  Surgeon: Rea Cambridge, MD;  Location: [facility];  Service: Urology;  Laterality: N/A;   GREEN LIGHT LASER TURP (TRANSURETHRAL RESECTION OF PROSTATE N/A 10/30/2017   Procedure: GREEN LIGHT LASER TURP (TRANSURETHRAL RESECTION OF PROSTATE;  Surgeon: Rea Cambridge, MD;  Location: [facility];  Service: Urology;  Laterality: N/A;   HOLMIUM LASER APPLICATION N/A 10/30/2017   Procedure: HOLMIUM LASER APPLICATION;  Surgeon: Rea Cambridge, MD;  Location: [facility];  Service: Urology;  Laterality: N/A;   LITHOTRIPSY  09/12/2007   LITHOTRIPSY  11/07/2007    Medications:  Current Outpatient Medications on File Prior to Visit  Medication Sig   Alpha-Lipoic Acid 100 MG CAPS Take by mouth.   Cholecalciferol (VITAMIN D3) 25 MCG (1000 UT) CAPS Take 1 capsule by mouth daily.   No current facility-administered medications on file prior to visit.    Allergies:  Allergies  Allergen Reactions   Other     TRISCUITS-RASH   Penicillin G Benzathine Rash    Has patient had a PCN reaction causing immediate rash, facial/tongue/throat swelling, SOB or lightheadedness with hypotension: Yes Has patient had a PCN reaction causing severe rash involving mucus membranes or skin necrosis: No Has patient had a PCN reaction that required hospitalization: No Has patient had a PCN reaction occurring within the last 10 years: No If all of the above answers are NO, then may proceed with Cephalosporin use.     Social History:  Social History   Socioeconomic History   Marital status: Married    Spouse name: Not on file   Number of children: Not on file   Years of education: Not on  file   Highest education level: Not on file  Occupational History   Not on file  Tobacco Use   Smoking status: Never   Smokeless tobacco: Never  Vaping Use   Vaping status: Never Used  Substance and Sexual Activity   Alcohol use: No    Alcohol/week: 0.0 standard drinks of alcohol   Drug use: No   Sexual activity: Never  Other Topics Concern   Not on file  Social History Narrative   Not on file   Social  Drivers of Corporate investment banker Strain: Low Risk  (06/22/2023)   Overall Financial Resource Strain (CARDIA)    Difficulty of Paying Living Expenses: Not hard at all  Food Insecurity: No Food Insecurity (03/22/2017)   Hunger Vital Sign    Worried About Running Out of Food in the Last Year: Never true    Ran Out of Food in the Last Year: Never true  Transportation Needs: No Transportation Needs (06/22/2023)   PRAPARE - Administrator, Civil Service (Medical): No    Lack of Transportation (Non-Medical): No  Physical Activity: Inactive (06/22/2023)   Exercise Vital Sign    Days of Exercise per Week: 0 days    Minutes of Exercise per Session: 0 min  Stress: No Stress Concern Present (06/22/2023)   [employer] of Occupational Health - Occupational Stress Questionnaire    Feeling of Stress: Not at all  Social Connections: Socially Integrated (06/22/2023)   Social Connection and Isolation Panel    Frequency of Communication with Friends and Family: Three times a week    Frequency of Social Gatherings with Friends and Family: Once a week    Attends Religious Services: More than 4 times per year    Active Member of [employer] or Organizations: Yes    Attends Engineer, structural: More than 4 times per year    Marital Status: Married  Catering manager Violence: Not At Risk (03/22/2017)   Humiliation, Afraid, Rape, and Kick questionnaire    Fear of Current or Ex-Partner: No    Emotionally Abused: No    Physically Abused: No    Sexually Abused: No   Social  History   Tobacco Use  Smoking Status Never  Smokeless Tobacco Never   Social History   Substance and Sexual Activity  Alcohol Use No   Alcohol/week: 0.0 standard drinks of alcohol    Family History:  Family History  Problem Relation Age of Onset   Cancer Mother        lung   Cancer Sister        breast   Cancer Father        prostate and skin   Varicose Veins Son    Cancer Paternal Grandmother        brain    Past medical history, surgical history, medications, allergies, family history and social history reviewed with patient today and changes made to appropriate areas of the chart.   Review of Systems  Constitutional: Negative.   HENT:  Positive for tinnitus. Negative for congestion, ear discharge, ear pain, nosebleeds, sinus pain and sore throat.   Eyes: Negative.        + visual changes- like a drop of water in his vision  Respiratory: Negative.  Negative for stridor.   Cardiovascular: Negative.  Negative for chest pain, palpitations, orthopnea, claudication, leg swelling and PND.  Gastrointestinal:  Positive for constipation. Negative for abdominal pain, blood in stool, diarrhea, heartburn, melena, nausea and vomiting.  Genitourinary: Negative.   Musculoskeletal:  Positive for joint pain and myalgias. Negative for back pain, falls and neck pain.  Skin: Negative.   Neurological: Negative.   Endo/Heme/Allergies:  Positive for environmental allergies. Negative for polydipsia. Does not bruise/bleed easily.  Psychiatric/Behavioral: Negative.     All other ROS negative except what is listed above and in the HPI.      Objective:    BP 100/64 (BP Location: Left Arm, Patient Position: Sitting, Cuff Size: Normal)   Pulse 67  Ht 5' 11.5 (1.816 m)   Wt 177 lb (80.3 kg)   SpO2 96%   BMI 24.34 kg/m   Wt Readings from Last 3 Encounters:  06/22/23 177 lb (80.3 kg)  10/24/22 182 lb 9.6 oz (82.8 kg)  08/16/22 173 lb 6.4 oz (78.7 kg)    Physical Exam Vitals and  nursing note reviewed.  Constitutional:      General: He is not in acute distress.    Appearance: Normal appearance. He is not ill-appearing, toxic-appearing or diaphoretic.  HENT:     Head: Normocephalic and atraumatic.     Right Ear: Tympanic membrane, ear canal and external ear normal. There is no impacted cerumen.     Left Ear: Tympanic membrane, ear canal and external ear normal. There is no impacted cerumen.     Nose: Nose normal. No congestion or rhinorrhea.     Mouth/Throat:     Mouth: Mucous membranes are moist.     Pharynx: Oropharynx is clear. No oropharyngeal exudate or posterior oropharyngeal erythema.   Eyes:     General: No scleral icterus.       Right eye: No discharge.        Left eye: No discharge.     Extraocular Movements: Extraocular movements intact.     Conjunctiva/sclera: Conjunctivae normal.     Pupils: Pupils are equal, round, and reactive to light.   Neck:     Vascular: No carotid bruit.   Cardiovascular:     Rate and Rhythm: Normal rate and regular rhythm.     Pulses: Normal pulses.     Heart sounds: No murmur heard.    No friction rub. No gallop.  Pulmonary:     Effort: Pulmonary effort is normal. No respiratory distress.     Breath sounds: Normal breath sounds. No stridor. No wheezing, rhonchi or rales.  Chest:     Chest wall: No tenderness.  Abdominal:     General: Abdomen is flat. Bowel sounds are normal. There is no distension.     Palpations: Abdomen is soft. There is no mass.     Tenderness: There is no abdominal tenderness. There is no right CVA tenderness, left CVA tenderness, guarding or rebound.     Hernia: No hernia is present.  Genitourinary:    Comments: Genital exam deferred with shared decision making  Musculoskeletal:        General: No swelling, tenderness, deformity or signs of injury.     Cervical back: Normal range of motion and neck supple. No rigidity. No muscular tenderness.     Right lower leg: No edema.     Left lower  leg: No edema.  Lymphadenopathy:     Cervical: No cervical adenopathy.   Skin:    General: Skin is warm and dry.     Capillary Refill: Capillary refill takes less than 2 seconds.     Coloration: Skin is not jaundiced or pale.     Findings: No bruising, erythema, lesion or rash.   Neurological:     General: No focal deficit present.     Mental Status: He is alert and oriented to person, place, and time.     Cranial Nerves: No cranial nerve deficit.     Sensory: No sensory deficit.     Motor: No weakness.     Coordination: Coordination normal.     Gait: Gait normal.     Deep Tendon Reflexes: Reflexes normal.   Psychiatric:  Mood and Affect: Mood normal.        Behavior: Behavior normal.        Thought Content: Thought content normal.        Judgment: Judgment normal.        06/22/2023    1:10 PM 04/24/2022    1:20 PM 10/04/2020    1:05 PM 09/05/2019    9:02 AM 07/19/2018   11:16 AM  6CIT Screen  What Year? 0 points 0 points 0 points 0 points 0 points  What month? 0 points 0 points 0 points 0 points 0 points  What time? 0 points 0 points 0 points 0 points 0 points  Count back from 20 0 points 0 points 0 points 0 points 0 points  Months in reverse 0 points 0 points 2 points 2 points 0 points  Repeat phrase 0 points 0 points 0 points 2 points 2 points  Total Score 0 points 0 points 2 points 4 points 2 points    Results for orders placed or performed in visit on 06/18/23  HM DIABETES EYE EXAM   Collection Time: 06/15/23  9:40 AM  Result Value Ref Range   HM Diabetic Eye Exam No Retinopathy No Retinopathy      Assessment & Plan:   Problem List Items Addressed This Visit       Endocrine   Type 2 diabetes mellitus with diabetic neuropathy, unspecified (HCC)   Rechecking labs today. Await results. Treat as needed.       Relevant Orders   Comprehensive metabolic panel with GFR   CBC with Differential/Platelet   TSH   Microalbumin, Urine Waived   Bayer DCA Hb  A1c Waived     Genitourinary   BPH (benign prostatic hyperplasia)   Rechecking labs today. Await results. Treat as needed.       Relevant Orders   Comprehensive metabolic panel with GFR   PSA     Other   Hypercholesteremia   Under good control on current regimen. Continue current regimen. Continue to monitor. Call with any concerns. Refills given. Labs drawn today.        Relevant Medications   ezetimibe  (ZETIA ) 10 MG tablet   Other Relevant Orders   Comprehensive metabolic panel with GFR   CBC with Differential/Platelet   Lipid Panel w/o Chol/HDL Ratio   Vitamin D  deficiency   Rechecking labs today. Await results. Treat as needed.       Relevant Orders   VITAMIN D  25 Hydroxy (Vit-D Deficiency, Fractures)   B12 deficiency   Rechecking labs today. Await results. Treat as needed.       Relevant Orders   B12   Other Visit Diagnoses       Encounter for Medicare annual wellness exam    -  Primary   Preventative care discussed today as below.     Routine general medical examination at a health care facility       Vaccines up to date. Screening labs cheked today. Colonoscopy up to date. Continue diet and exercise. Call with any concerns.     Cerumen in auditory canal on examination       R ear flushed today with good results.        Preventative Services:  Health Risk Assessment and Personalized Prevention Plan: Done today Bone Mass Measurements: N/A CVD Screening: Done today Colon Cancer Screening: up to date Depression Screening: done today Diabetes Screening: done today Glaucoma Screening: see your eye doctor Hepatitis B  vaccine: N/A Hepatitis C screening: Up to date HIV Screening: up to date Flu Vaccine: get in the fall Lung cancer Screening: N/A Obesity Screening: done today Pneumonia Vaccines (2): up to date STI Screening: N/A PSA screening: up to date   LABORATORY TESTING:  Health maintenance labs ordered today as discussed above.   The natural  history of prostate cancer and ongoing controversy regarding screening and potential treatment outcomes of prostate cancer has been discussed with the patient. The meaning of a false positive PSA and a false negative PSA has been discussed. He indicates understanding of the limitations of this screening test and wishes to proceed with screening PSA testing.   IMMUNIZATIONS:   - Tdap: Tetanus vaccination status reviewed: last tetanus booster within 10 years. - Influenza: Postponed to flu season - Pneumovax: Up to date - Prevnar: Up to date - Zostavax vaccine: Up to date  SCREENING: - Colonoscopy: Up to date  Discussed with patient purpose of the colonoscopy is to detect colon cancer at curable precancerous or early stages    PATIENT COUNSELING:    Sexuality: Discussed sexually transmitted diseases, partner selection, use of condoms, avoidance of unintended pregnancy  and contraceptive alternatives.   Advised to avoid cigarette smoking.  I discussed with the patient that most people either abstain from alcohol or drink within safe limits (<=14/week and <=4 drinks/occasion for males, <=7/weeks and <= 3 drinks/occasion for females) and that the risk for alcohol disorders and other health effects rises proportionally with the number of drinks per week and how often a drinker exceeds daily limits.  Discussed cessation/primary prevention of drug use and availability of treatment for abuse.   Diet: Encouraged to adjust caloric intake to maintain  or achieve ideal body weight, to reduce intake of dietary saturated fat and total fat, to limit sodium intake by avoiding high sodium foods and not adding table salt, and to maintain adequate dietary potassium and calcium preferably from fresh fruits, vegetables, and low-fat dairy products.    stressed the importance of regular exercise  Injury prevention: Discussed safety belts, safety helmets, smoke detector, smoking near bedding or upholstery.    Dental health: Discussed importance of regular tooth brushing, flossing, and dental visits.   Follow up plan: NEXT PREVENTATIVE PHYSICAL DUE IN 1 YEAR. Return in about 6 months (around 12/22/2023).

## 2023-06-22 NOTE — Patient Instructions (Addendum)
 Preventative Services:  Health Risk Assessment and Personalized Prevention Plan: Done today Bone Mass Measurements: N/A CVD Screening: Done today Colon Cancer Screening: up to date Depression Screening: done today Diabetes Screening: done today Glaucoma Screening: see your eye doctor Hepatitis B vaccine: N/A Hepatitis C screening: Up to date HIV Screening: up to date Flu Vaccine: get in the fall Lung cancer Screening: N/A Obesity Screening: done today Pneumonia Vaccines (2): up to date STI Screening: N/A PSA screening: up to date   [name] , Thank you for taking time to come for your Medicare Wellness Visit. I appreciate your ongoing commitment to your health goals. Please review the following plan we discussed and let me know if I can assist you in the future.   These are the goals we discussed:  Goals      DIET - INCREASE WATER INTAKE     recommend drinking at least 6-8 glasses of water a day         This is a list of the screening recommended for you and due dates:  Health Maintenance  Topic Date Due   Yearly kidney health urinalysis for diabetes  04/24/2023   Hemoglobin A1C  04/24/2023   Flu Shot  08/03/2023   Yearly kidney function blood test for diabetes  10/24/2023   Eye exam for diabetics  06/14/2024   Complete foot exam   06/21/2024   Colon Cancer Screening  08/16/2027   DTaP/Tdap/Td vaccine (3 - Tdap) 07/18/2028   Pneumococcal Vaccine for age over 5  Completed   Hepatitis C Screening  Completed   Zoster (Shingles) Vaccine  Completed   HPV Vaccine  Aged Out   Meningitis B Vaccine  Aged Out   COVID-19 Vaccine  Discontinued

## 2023-06-23 LAB — CBC WITH DIFFERENTIAL/PLATELET
Basophils Absolute: 0 10*3/uL (ref 0.0–0.2)
Basos: 1 %
EOS (ABSOLUTE): 0.1 10*3/uL (ref 0.0–0.4)
Eos: 2 %
Hematocrit: 40.8 % (ref 37.5–51.0)
Hemoglobin: 13.4 g/dL (ref 13.0–17.7)
Immature Grans (Abs): 0 10*3/uL (ref 0.0–0.1)
Immature Granulocytes: 0 %
Lymphocytes Absolute: 0.9 10*3/uL (ref 0.7–3.1)
Lymphs: 20 %
MCH: 33.3 pg — ABNORMAL HIGH (ref 26.6–33.0)
MCHC: 32.8 g/dL (ref 31.5–35.7)
MCV: 101 fL — ABNORMAL HIGH (ref 79–97)
Monocytes Absolute: 0.4 10*3/uL (ref 0.1–0.9)
Monocytes: 10 %
Neutrophils Absolute: 2.8 10*3/uL (ref 1.4–7.0)
Neutrophils: 66 %
Platelets: 125 10*3/uL — ABNORMAL LOW (ref 150–450)
RBC: 4.03 x10E6/uL — ABNORMAL LOW (ref 4.14–5.80)
RDW: 13.2 % (ref 11.6–15.4)
WBC: 4.2 10*3/uL (ref 3.4–10.8)

## 2023-06-23 LAB — COMPREHENSIVE METABOLIC PANEL WITH GFR
ALT: 19 IU/L (ref 0–44)
AST: 26 IU/L (ref 0–40)
Albumin: 4.7 g/dL (ref 3.8–4.8)
Alkaline Phosphatase: 68 IU/L (ref 44–121)
BUN/Creatinine Ratio: 15 (ref 10–24)
BUN: 14 mg/dL (ref 8–27)
Bilirubin Total: 0.9 mg/dL (ref 0.0–1.2)
CO2: 23 mmol/L (ref 20–29)
Calcium: 9.1 mg/dL (ref 8.6–10.2)
Chloride: 103 mmol/L (ref 96–106)
Creatinine, Ser: 0.92 mg/dL (ref 0.76–1.27)
Globulin, Total: 2 g/dL (ref 1.5–4.5)
Glucose: 89 mg/dL (ref 70–99)
Potassium: 4.1 mmol/L (ref 3.5–5.2)
Sodium: 143 mmol/L (ref 134–144)
Total Protein: 6.7 g/dL (ref 6.0–8.5)
eGFR: 86 mL/min/{1.73_m2} (ref >59)

## 2023-06-23 LAB — LIPID PANEL W/O CHOL/HDL RATIO
Cholesterol, Total: 138 mg/dL (ref 100–199)
HDL: 47 mg/dL (ref >39)
LDL Chol Calc (NIH): 76 mg/dL (ref 0–99)
Triglycerides: 77 mg/dL (ref 0–149)
VLDL Cholesterol Cal: 15 mg/dL (ref 5–40)

## 2023-06-23 LAB — PSA: Prostate Specific Ag, Serum: 0.6 ng/mL (ref 0.0–4.0)

## 2023-06-23 LAB — VITAMIN B12: Vitamin B-12: 514 pg/mL (ref 232–1245)

## 2023-06-23 LAB — VITAMIN D 25 HYDROXY (VIT D DEFICIENCY, FRACTURES): Vit D, 25-Hydroxy: 28 ng/mL — ABNORMAL LOW (ref 30.0–100.0)

## 2023-06-23 LAB — TSH: TSH: 0.776 u[IU]/mL (ref 0.450–4.500)

## 2023-06-25 ENCOUNTER — Ambulatory Visit: Payer: Self-pay | Admitting: Family Medicine

## 2023-06-25 NOTE — Telephone Encounter (Signed)
 Letter printed and mailed.

## 2023-06-25 NOTE — Progress Notes (Signed)
 Letter printed and mailed.

## 2023-06-26 ENCOUNTER — Encounter: Admitting: Family Medicine

## 2023-11-03 ENCOUNTER — Other Ambulatory Visit: Payer: Self-pay | Admitting: Family Medicine

## 2023-11-05 NOTE — Telephone Encounter (Signed)
 Requested Prescriptions  Pending Prescriptions Disp Refills   ezetimibe  (ZETIA ) 10 MG tablet [Pharmacy Med Name: EZETIMIBE  10 MG TAB] 90 tablet 0    Sig: TAKE 1 TABLET BY MOUTH ONCE DAILY     Cardiovascular:  Antilipid - Sterol Transport Inhibitors Failed - 11/05/2023  2:41 PM      Failed - Lipid Panel in normal range within the last 12 months    Cholesterol, Total  Date Value Ref Range Status  06/22/2023 138 100 - 199 mg/dL Final   LDL Chol Calc (NIH)  Date Value Ref Range Status  06/22/2023 76 0 - 99 mg/dL Final   HDL  Date Value Ref Range Status  06/22/2023 47 >39 mg/dL Final   Triglycerides  Date Value Ref Range Status  06/22/2023 77 0 - 149 mg/dL Final         Passed - AST in normal range and within 360 days    AST  Date Value Ref Range Status  06/22/2023 26 0 - 40 IU/L Final         Passed - ALT in normal range and within 360 days    ALT  Date Value Ref Range Status  06/22/2023 19 0 - 44 IU/L Final         Passed - Patient is not pregnant      Passed - Valid encounter within last 12 months    Recent Outpatient Visits           4 months ago Routine general medical examination at a health care facility   [facility] Vicci Duwaine SQUIBB, DO       Future Appointments             In 4 months Hester Alm BROCKS, MD [facility]

## 2023-12-24 ENCOUNTER — Ambulatory Visit: Admitting: Family Medicine

## 2024-03-11 ENCOUNTER — Ambulatory Visit: Admitting: Dermatology
